# Patient Record
Sex: Female | Born: 1945
Health system: Southern US, Community
[De-identification: ages and names within clinical notes are randomized; demographics above are authoritative.]

## PROBLEM LIST (undated history)

## (undated) DIAGNOSIS — D649 Anemia, unspecified: Secondary | ICD-10-CM

## (undated) DIAGNOSIS — E119 Type 2 diabetes mellitus without complications: Secondary | ICD-10-CM

## (undated) DIAGNOSIS — N189 Chronic kidney disease, unspecified: Secondary | ICD-10-CM

## (undated) HISTORY — DX: Type 2 diabetes mellitus without complications: E11.9

## (undated) HISTORY — DX: Anemia, unspecified: D64.9

## (undated) HISTORY — DX: Chronic kidney disease, unspecified: N18.9

---

## 1986-12-11 HISTORY — PX: VAGINAL HYSTERECTOMY: SUR661

## 2001-11-09 HISTORY — PX: GASTRIC BYPASS: SHX52

## 2008-05-17 HISTORY — PX: OTHER SURGICAL HISTORY: SHX169

## 2010-05-17 DIAGNOSIS — I10 Essential (primary) hypertension: Secondary | ICD-10-CM | POA: Insufficient documentation

## 2010-05-17 DIAGNOSIS — E119 Type 2 diabetes mellitus without complications: Secondary | ICD-10-CM | POA: Insufficient documentation

## 2010-05-17 DIAGNOSIS — M353 Polymyalgia rheumatica: Secondary | ICD-10-CM | POA: Insufficient documentation

## 2010-05-17 DIAGNOSIS — E782 Mixed hyperlipidemia: Secondary | ICD-10-CM | POA: Insufficient documentation

## 2010-11-22 DIAGNOSIS — E559 Vitamin D deficiency, unspecified: Secondary | ICD-10-CM | POA: Insufficient documentation

## 2013-04-07 DIAGNOSIS — D509 Iron deficiency anemia, unspecified: Secondary | ICD-10-CM | POA: Insufficient documentation

## 2013-04-07 DIAGNOSIS — E538 Deficiency of other specified B group vitamins: Secondary | ICD-10-CM | POA: Insufficient documentation

## 2014-04-24 ENCOUNTER — Ambulatory Visit (INDEPENDENT_AMBULATORY_CARE_PROVIDER_SITE_OTHER): Payer: Medicare Other | Admitting: *Deleted

## 2014-04-24 DIAGNOSIS — E538 Deficiency of other specified B group vitamins: Secondary | ICD-10-CM

## 2014-04-24 MED ORDER — CYANOCOBALAMIN 1000 MCG/ML IJ SOLN
1000.0000 ug | INTRAMUSCULAR | Status: DC
  Administered 2014-04-24 – 2016-01-04 (×17): 1000 ug via INTRAMUSCULAR

## 2014-04-24 MED ORDER — GLUCOSE BLOOD VI STRP: ORAL_STRIP | Status: DC

## 2014-04-24 MED ORDER — ONETOUCH ULTRA SYSTEM W/DEVICE KIT: 1.0000 | PACK | Freq: Once | Status: DC

## 2014-04-24 NOTE — Progress Notes (Signed)
Vitamin b12 injection given and tolerated well. Order was given by dr. Darlyn Read.

## 2014-05-19 ENCOUNTER — Encounter: Payer: Self-pay | Admitting: Family Medicine

## 2014-05-19 ENCOUNTER — Encounter (INDEPENDENT_AMBULATORY_CARE_PROVIDER_SITE_OTHER): Payer: Self-pay

## 2014-05-19 ENCOUNTER — Ambulatory Visit (INDEPENDENT_AMBULATORY_CARE_PROVIDER_SITE_OTHER): Payer: Medicare Other | Admitting: Family Medicine

## 2014-05-19 VITALS — BP 139/83 | HR 82 | Temp 97.1°F | Ht 64.5 in | Wt 205.8 lb

## 2014-05-19 DIAGNOSIS — E119 Type 2 diabetes mellitus without complications: Secondary | ICD-10-CM

## 2014-05-19 DIAGNOSIS — E039 Hypothyroidism, unspecified: Secondary | ICD-10-CM

## 2014-05-19 DIAGNOSIS — M069 Rheumatoid arthritis, unspecified: Secondary | ICD-10-CM

## 2014-05-19 DIAGNOSIS — D519 Vitamin B12 deficiency anemia, unspecified: Secondary | ICD-10-CM

## 2014-05-19 DIAGNOSIS — E538 Deficiency of other specified B group vitamins: Secondary | ICD-10-CM

## 2014-05-19 LAB — POCT GLYCOSYLATED HEMOGLOBIN (HGB A1C): Hemoglobin A1C: 6

## 2014-05-19 NOTE — Progress Notes (Signed)
Subjective:  [name], female    DOB: [birth year]  Age: 69 y.o. MRN: [id number]  CC: Diabetes; Gastrophageal Reflux; Hyperlipidemia; Hypothyroidism; and Vit B12 deficiency   HPI Erin Ortiz presents foPatient does check blood sugar at home Patient denies symptoms such as polyuria, polydipsia, excessive hunger, nausea. Medications as noted below. Taking them regularly without complication/adverse reaction being reported today. a few hypoglycemic spells after Christmas. Had iron infusion last month with Dr. Vira Agar. Saw Dr. Franki Monte and Binnie Rail also. Has a cataract OS, but no retinopathy.  Patient presents for follow-up on  thyroid. She has a history of hypothyroidism for many years. It has been stable recently. Pt. denies any change in  voice, loss of hair, heat or cold intolerance. Energy level has been adequate to good. She denies constipation and diarrhea. No myxedema. Medication is as noted below. Verified that pt is taking it daily on an empty stomach. Well tolerated.  Patient in for follow-up of GERD. He is currently asymptomatic taking his PPI daily. There is no chest pain or heartburn. He is taking the medication regularly. No dysphagia or choking.   Patient in for follow-up of elevated cholesterol. Doing well without complaints on current medication. Denies side effects of statin including myalgia and arthralgia and nausea. Also in today for liver function testing. Currently no chest pain, shortness of breath or other cardiovascular related symptoms noted. History Erin Ortiz has no past medical history on file.   She has no past surgical history on file.   Her family history is not on file.She reports that she has never smoked. She does not have any smokeless tobacco history on file. She reports that she does not drink alcohol or use illicit drugs.  Current Outpatient Prescriptions on File Prior to Visit  Medication Sig Dispense Refill  . Blood Glucose Monitoring Suppl  (ONE TOUCH ULTRA SYSTEM KIT) W/DEVICE KIT 1 kit by Does not apply route once. 1 each 0  . glucose blood test strip Use as instructed 100 each 12   Current Facility-Administered Medications on File Prior to Visit  Medication Dose Route Frequency Provider Last Rate Last Dose  . cyanocobalamin ((VITAMIN B-12)) injection 1,000 mcg  1,000 mcg Intramuscular Q30 days Claretta Fraise, MD   1,000 mcg at 05/19/14 1041    ROS Review of Systems  Constitutional: Negative for fever, chills, diaphoresis, appetite change, fatigue and unexpected weight change.  HENT: Negative for congestion, ear pain, hearing loss, postnasal drip, rhinorrhea, sneezing, sore throat and trouble swallowing.   Eyes: Negative for pain.  Respiratory: Negative for cough, chest tightness and shortness of breath.   Cardiovascular: Negative for chest pain and palpitations.  Gastrointestinal: Negative for nausea, vomiting, abdominal pain, diarrhea and constipation.  Genitourinary: Negative for dysuria, frequency and menstrual problem.  Musculoskeletal: Negative for joint swelling and arthralgias.  Skin: Negative for rash.  Neurological: Negative for dizziness, weakness, numbness and headaches.  Psychiatric/Behavioral: Negative for dysphoric mood and agitation.    Objective:  BP 139/83 mmHg  Pulse 82  Temp(Src) 97.1 F (36.2 C) (Oral)  Ht 5' 4.5" (1.638 m)  Wt 205 lb 12.8 oz (93.35 kg)  BMI 34.79 kg/m2  LMP 04/11/1986  Physical Exam  Constitutional: She is oriented to person, place, and time. She appears well-developed and well-nourished. No distress.  HENT:  Head: Normocephalic and atraumatic.  Right Ear: External ear normal.  Left Ear: External ear normal.  Nose: Nose normal.  Mouth/Throat: Oropharynx is clear and moist.  Eyes: Conjunctivae and EOM  are normal. Pupils are equal, round, and reactive to light.  Neck: Normal range of motion. Neck supple. No thyromegaly present.  Cardiovascular: Normal rate, regular rhythm  and normal heart sounds.   No murmur heard. Pulmonary/Chest: Effort normal and breath sounds normal. No respiratory distress. She has no wheezes. She has no rales.  Abdominal: Soft. Bowel sounds are normal. She exhibits no distension. There is no tenderness.  Lymphadenopathy:    She has no cervical adenopathy.  Neurological: She is alert and oriented to person, place, and time. She has normal reflexes.  Skin: Skin is warm and dry.  Psychiatric: She has a normal mood and affect. Her behavior is normal. Judgment and thought content normal.    Assessment & Plan:   Erin Ortiz was seen today for diabetes, gastrophageal reflux, hyperlipidemia, hypothyroidism and vit b12 deficiency.  Diagnoses and associated orders for this visit:  Diabetes mellitus type 2, controlled, without complications - POCT glycosylated hemoglobin (Hb A1C) - TSH - T4, Free - CMP14+EGFR - Microalbumin, urine  Hypothyroidism, unspecified hypothyroidism type - TSH - T4, Free - CMP14+EGFR - Microalbumin, urine  Rheumatoid arthritis - TSH - T4, Free - CMP14+EGFR - Microalbumin, urine  Anemia due to vitamin B12 deficiency - TSH - T4, Free - CMP14+EGFR - Microalbumin, urine    I am having Ms. Abplanalp maintain her ONE TOUCH ULTRA SYSTEM KIT, glucose blood, atorvastatin, Calcium Carbonate-Vitamin D, Vitamin D, cyanocobalamin, metFORMIN, levothyroxine, lisinopril, omeprazole, predniSONE, and raloxifene. We administered cyanocobalamin. We will continue to administer cyanocobalamin.  Meds ordered this encounter  Medications  . atorvastatin (LIPITOR) 40 MG tablet    Sig: Take 40 mg by mouth daily.  . Calcium Carbonate-Vitamin D 600-125 MG-UNIT TABS    Sig: Take 2 tablets by mouth daily.  . Cholecalciferol (VITAMIN D) 2000 UNITS tablet    Sig: Take 2,000 Units by mouth daily.  . cyanocobalamin (,VITAMIN B-12,) 1000 MCG/ML injection    Sig: Inject 1,000 mcg into the muscle every 30 (thirty) days.  . metFORMIN  (GLUCOPHAGE-XR) 750 MG 24 hr tablet    Sig: Take 750 mg by mouth 2 (two) times daily.  Marland Kitchen levothyroxine (SYNTHROID, LEVOTHROID) 125 MCG tablet    Sig: Take 125 mcg by mouth daily.  Marland Kitchen lisinopril (PRINIVIL,ZESTRIL) 20 MG tablet    Sig: Take 20 mg by mouth 2 (two) times daily.  Marland Kitchen omeprazole (PRILOSEC) 40 MG capsule    Sig: Take 40 mg by mouth daily.  . predniSONE (DELTASONE) 1 MG tablet    Sig: Take 3 mg by mouth daily.  . raloxifene (EVISTA) 60 MG tablet    Sig: Take 60 mg by mouth daily.     Follow-up: Return in about 3 months (around 08/17/2014) for diabetes.  Claretta Fraise, M.D.

## 2014-05-19 NOTE — Patient Instructions (Signed)
Diabetes and Foot Care Diabetes may cause you to have problems because of poor blood supply (circulation) to your feet and legs. This may cause the skin on your feet to become thinner, break easier, and heal more slowly. Your skin may become dry, and the skin may peel and crack. You may also have nerve damage in your legs and feet causing decreased feeling in them. You may not notice minor injuries to your feet that could lead to infections or more serious problems. Taking care of your feet is one of the most important things you can do for yourself.  HOME CARE INSTRUCTIONS  Wear shoes at all times, even in the house. Do not go barefoot. Bare feet are easily injured.  Check your feet daily for blisters, cuts, and redness. If you cannot see the bottom of your feet, use a mirror or ask someone for help.  Wash your feet with warm water (do not use hot water) and mild soap. Then pat your feet and the areas between your toes until they are completely dry. Do not soak your feet as this can dry your skin.  Apply a moisturizing lotion or petroleum jelly (that does not contain alcohol and is unscented) to the skin on your feet and to dry, brittle toenails. Do not apply lotion between your toes.  Trim your toenails straight across. Do not dig under them or around the cuticle. File the edges of your nails with an emery board or nail file.  Do not cut corns or calluses or try to remove them with medicine.  Wear clean socks or stockings every day. Make sure they are not too tight. Do not wear knee-high stockings since they may decrease blood flow to your legs.  Wear shoes that fit properly and have enough cushioning. To break in new shoes, wear them for just a few hours a day. This prevents you from injuring your feet. Always look in your shoes before you put them on to be sure there are no objects inside.  Do not cross your legs. This may decrease the blood flow to your feet.  If you find a minor scrape,  cut, or break in the skin on your feet, keep it and the skin around it clean and dry. These areas may be cleansed with mild soap and water. Do not cleanse the area with peroxide, alcohol, or iodine.  When you remove an adhesive bandage, be sure not to damage the skin around it.  If you have a wound, look at it several times a day to make sure it is healing.  Do not use heating pads or hot water bottles. They may burn your skin. If you have lost feeling in your feet or legs, you may not know it is happening until it is too late.  Make sure your health care provider performs a complete foot exam at least annually or more often if you have foot problems. Report any cuts, sores, or bruises to your health care provider immediately. SEEK MEDICAL CARE IF:   You have an injury that is not healing.  You have cuts or breaks in the skin.  You have an ingrown nail.  You notice redness on your legs or feet.  You feel burning or tingling in your legs or feet.  You have pain or cramps in your legs and feet.  Your legs or feet are numb.  Your feet always feel cold. SEEK IMMEDIATE MEDICAL CARE IF:   There is increasing redness,   swelling, or pain in or around a wound.  There is a red line that goes up your leg.  Pus is coming from a wound.  You develop a fever or as directed by your health care provider.  You notice a bad smell coming from an ulcer or wound. Document Released: 03/25/2000 Document Revised: 11/28/2012 Document Reviewed: 09/04/2012 Exit[employer] Patient Information 2015 [employer]. This information is not intended to replace advice given to you by your health care provider. Make sure you discuss any questions you have with your health care provider. DASH Eating Plan DASH stands for "Dietary Approaches to Stop Hypertension." The DASH eating plan is a healthy eating plan that has been shown to reduce high blood pressure (hypertension). Additional health benefits may include reducing  the risk of type 2 diabetes mellitus, heart disease, and stroke. The DASH eating plan may also help with weight loss. WHAT DO I NEED TO KNOW ABOUT THE DASH EATING PLAN? For the DASH eating plan, you will follow these general guidelines:  Choose foods with a percent daily value for sodium of less than 5% (as listed on the food label).  Use salt-free seasonings or herbs instead of table salt or sea salt.  Check with your health care provider or pharmacist before using salt substitutes.  Eat lower-sodium products, often labeled as "lower sodium" or "no salt added."  Eat fresh foods.  Eat more vegetables, fruits, and low-fat dairy products.  Choose whole grains. Look for the word "whole" as the first word in the ingredient list.  Choose fish and skinless chicken or turkey more often than red meat. Limit fish, poultry, and meat to 6 oz (170 g) each day.  Limit sweets, desserts, sugars, and sugary drinks.  Choose heart-healthy fats.  Limit cheese to 1 oz (28 g) per day.  Eat more home-cooked food and less restaurant, buffet, and fast food.  Limit fried foods.  Cook foods using methods other than frying.  Limit canned vegetables. If you do use them, rinse them well to decrease the sodium.  When eating at a restaurant, ask that your food be prepared with less salt, or no salt if possible. WHAT FOODS CAN I EAT? Seek help from a dietitian for individual calorie needs. Grains Whole grain or whole wheat bread. Brown rice. Whole grain or whole wheat pasta. Quinoa, bulgur, and whole grain cereals. Low-sodium cereals. Corn or whole wheat flour tortillas. Whole grain cornbread. Whole grain crackers. Low-sodium crackers. Vegetables Fresh or frozen vegetables (raw, steamed, roasted, or grilled). Low-sodium or reduced-sodium tomato and vegetable juices. Low-sodium or reduced-sodium tomato sauce and paste. Low-sodium or reduced-sodium canned vegetables.  Fruits All fresh, canned (in natural  juice), or frozen fruits. Meat and Other Protein Products Ground beef (85% or leaner), grass-fed beef, or beef trimmed of fat. Skinless chicken or turkey. Ground chicken or turkey. Pork trimmed of fat. All fish and seafood. Eggs. Dried beans, peas, or lentils. Unsalted nuts and seeds. Unsalted canned beans. Dairy Low-fat dairy products, such as skim or 1% milk, 2% or reduced-fat cheeses, low-fat ricotta or cottage cheese, or plain low-fat yogurt. Low-sodium or reduced-sodium cheeses. Fats and Oils Tub margarines without trans fats. Light or reduced-fat mayonnaise and salad dressings (reduced sodium). Avocado. Safflower, olive, or canola oils. Natural peanut or almond butter. Other Unsalted popcorn and pretzels. The items listed above may not be a complete list of recommended foods or beverages. Contact your dietitian for more options. WHAT FOODS ARE NOT RECOMMENDED? Grains White bread. White pasta.   White rice. Refined cornbread. Bagels and croissants. Crackers that contain trans fat. Vegetables Creamed or fried vegetables. Vegetables in a cheese sauce. Regular canned vegetables. Regular canned tomato sauce and paste. Regular tomato and vegetable juices. Fruits Dried fruits. Canned fruit in light or heavy syrup. Fruit juice. Meat and Other Protein Products Fatty cuts of meat. Ribs, chicken wings, bacon, sausage, bologna, salami, chitterlings, fatback, hot dogs, bratwurst, and packaged luncheon meats. Salted nuts and seeds. Canned beans with salt. Dairy Whole or 2% milk, cream, half-and-half, and cream cheese. Whole-fat or sweetened yogurt. Full-fat cheeses or blue cheese. Nondairy creamers and whipped toppings. Processed cheese, cheese spreads, or cheese curds. Condiments Onion and garlic salt, seasoned salt, table salt, and sea salt. Canned and packaged gravies. Worcestershire sauce. Tartar sauce. Barbecue sauce. Teriyaki sauce. Soy sauce, including reduced sodium. Steak sauce. Fish sauce.  Oyster sauce. Cocktail sauce. Horseradish. Ketchup and mustard. Meat flavorings and tenderizers. Bouillon cubes. Hot sauce. Tabasco sauce. Marinades. Taco seasonings. Relishes. Fats and Oils Butter, stick margarine, lard, shortening, ghee, and bacon fat. Coconut, palm kernel, or palm oils. Regular salad dressings. Other Pickles and olives. Salted popcorn and pretzels. The items listed above may not be a complete list of foods and beverages to avoid. Contact your dietitian for more information. WHERE CAN I FIND MORE INFORMATION? National Heart, Lung, and [employer] Institute: www.nhlbi.nih.gov/health/health-topics/topics/dash/ Document Released: 03/17/2011 Document Revised: 08/12/2013 Document Reviewed: 01/30/2013 ExitCare Patient Information 2015 [employer]. This information is not intended to replace advice given to you by your health care provider. Make sure you discuss any questions you have with your health care provider. Basic Carbohydrate Counting for Diabetes Mellitus Carbohydrate counting is a method for keeping track of the amount of carbohydrates you eat. Eating carbohydrates naturally increases the level of sugar (glucose) in your blood, so it is important for you to know the amount that is okay for you to have in every meal. Carbohydrate counting helps keep the level of glucose in your blood within normal limits. The amount of carbohydrates allowed is different for every person. A dietitian can help you calculate the amount that is right for you. Once you know the amount of carbohydrates you can have, you can count the carbohydrates in the foods you want to eat. Carbohydrates are found in the following foods:  Grains, such as breads and cereals.  Dried beans and soy products.  Starchy vegetables, such as potatoes, peas, and corn.  Fruit and fruit juices.  Milk and yogurt.  Sweets and snack foods, such as cake, cookies, candy, chips, soft drinks, and fruit drinks. CARBOHYDRATE  COUNTING There are two ways to count the carbohydrates in your food. You can use either of the methods or a combination of both. Reading the "Nutrition Facts" on Packaged Food The "Nutrition Facts" is an area that is included on the labels of almost all packaged food and beverages in the [place]. It includes the serving size of that food or beverage and information about the nutrients in each serving of the food, including the grams (g) of carbohydrate per serving.  Decide the number of servings of this food or beverage that you will be able to eat or drink. Multiply that number of servings by the number of grams of carbohydrate that is listed on the label for that serving. The total will be the amount of carbohydrates you will be having when you eat or drink this food or beverage. Learning Standard Serving Sizes of Food When you eat food that   is not packaged or does not include "Nutrition Facts" on the label, you need to measure the servings in order to count the amount of carbohydrates.A serving of most carbohydrate-rich foods contains about 15 g of carbohydrates. The following list includes serving sizes of carbohydrate-rich foods that provide 15 g ofcarbohydrate per serving:   1 slice of bread (1 oz) or 1 six-inch tortilla.    of a hamburger bun or English muffin.  4-6 crackers.   cup unsweetened dry cereal.    cup hot cereal.   cup rice or pasta.    cup mashed potatoes or  of a large baked potato.  1 cup fresh fruit or one small piece of fruit.    cup canned or frozen fruit or fruit juice.  1 cup milk.   cup plain fat-free yogurt or yogurt sweetened with artificial sweeteners.   cup cooked dried beans or starchy vegetable, such as peas, corn, or potatoes.  Decide the number of standard-size servings that you will eat. Multiply that number of servings by 15 (the grams of carbohydrates in that serving). For example, if you eat 2 cups of strawberries, you will  have eaten 2 servings and 30 g of carbohydrates (2 servings x 15 g = 30 g). For foods such as soups and casseroles, in which more than one food is mixed in, you will need to count the carbohydrates in each food that is included. EXAMPLE OF CARBOHYDRATE COUNTING Sample Dinner  3 oz chicken breast.   cup of brown rice.   cup of corn.  1 cup milk.   1 cup strawberries with sugar-free whipped topping.  Carbohydrate Calculation Step 1: Identify the foods that contain carbohydrates:   Rice.   Corn.   Milk.   Strawberries. Step 2:Calculate the number of servings eaten of each:   2 servings of rice.   1 serving of corn.   1 serving of milk.   1 serving of strawberries. Step 3: Multiply each of those number of servings by 15 g:   2 servings of rice x 15 g = 30 g.   1 serving of corn x 15 g = 15 g.   1 serving of milk x 15 g = 15 g.   1 serving of strawberries x 15 g = 15 g. Step 4: Add together all of the amounts to find the total grams of carbohydrates eaten: 30 g + 15 g + 15 g + 15 g = 75 g. Document Released: 03/28/2005 Document Revised: 08/12/2013 Document Reviewed: 02/22/2013 Exit[employer] Patient Information 2015 [employer]. This information is not intended to replace advice given to you by your health care provider. Make sure you discuss any questions you have with your health care provider.  

## 2014-05-20 LAB — MICROALBUMIN, URINE

## 2014-06-18 ENCOUNTER — Other Ambulatory Visit: Payer: Self-pay | Admitting: Family Medicine

## 2014-06-19 ENCOUNTER — Ambulatory Visit: Payer: Medicare Other

## 2014-06-19 MED ORDER — METFORMIN HCL ER 750 MG PO TB24: 750.0000 mg | ORAL_TABLET | Freq: Two times a day (BID) | ORAL | Status: DC

## 2014-06-19 NOTE — Telephone Encounter (Signed)
Pt needed a refill on her metformin, was just seen 05/17/14 by dr Darlyn Read.

## 2014-06-23 ENCOUNTER — Ambulatory Visit (INDEPENDENT_AMBULATORY_CARE_PROVIDER_SITE_OTHER): Payer: Medicare Other | Admitting: *Deleted

## 2014-06-23 ENCOUNTER — Telehealth: Payer: Self-pay | Admitting: *Deleted

## 2014-06-23 DIAGNOSIS — E538 Deficiency of other specified B group vitamins: Secondary | ICD-10-CM | POA: Diagnosis not present

## 2014-06-23 NOTE — Patient Instructions (Signed)

## 2014-06-23 NOTE — Progress Notes (Signed)
Patient tolerated well.

## 2014-07-28 ENCOUNTER — Ambulatory Visit: Payer: Medicare Other

## 2014-08-18 ENCOUNTER — Encounter: Payer: Self-pay | Admitting: Family Medicine

## 2014-08-18 ENCOUNTER — Ambulatory Visit (INDEPENDENT_AMBULATORY_CARE_PROVIDER_SITE_OTHER): Payer: Medicare Other | Admitting: Family Medicine

## 2014-08-18 VITALS — BP 117/70 | HR 79 | Temp 97.5°F | Ht 65.0 in | Wt 205.4 lb

## 2014-08-18 DIAGNOSIS — Z780 Asymptomatic menopausal state: Secondary | ICD-10-CM | POA: Diagnosis not present

## 2014-08-18 DIAGNOSIS — D649 Anemia, unspecified: Secondary | ICD-10-CM

## 2014-08-18 DIAGNOSIS — E538 Deficiency of other specified B group vitamins: Secondary | ICD-10-CM | POA: Diagnosis not present

## 2014-08-18 DIAGNOSIS — E038 Other specified hypothyroidism: Secondary | ICD-10-CM | POA: Diagnosis not present

## 2014-08-18 DIAGNOSIS — E785 Hyperlipidemia, unspecified: Secondary | ICD-10-CM | POA: Diagnosis not present

## 2014-08-18 DIAGNOSIS — E119 Type 2 diabetes mellitus without complications: Secondary | ICD-10-CM

## 2014-08-18 LAB — POCT GLYCOSYLATED HEMOGLOBIN (HGB A1C): Hemoglobin A1C: 5.9

## 2014-08-18 LAB — POCT CBC
Granulocyte percent: 71.4 %G (ref 37–80)
HEMATOCRIT: 34.2 % — AB (ref 37.7–47.9)
Hemoglobin: 10.5 g/dL — AB (ref 12.2–16.2)
LYMPH, POC: 1.5 (ref 0.6–3.4)
MCH: 27.1 pg (ref 27–31.2)
MCHC: 30.6 g/dL — AB (ref 31.8–35.4)
MCV: 88.5 fL (ref 80–97)
MPV: 8.9 fL (ref 0–99.8)
PLATELET COUNT, POC: 183 10*3/uL (ref 142–424)
POC Granulocyte: 4.5 (ref 2–6.9)
POC LYMPH PERCENT: 23.5 %L (ref 10–50)
RBC: 3.87 M/uL — AB (ref 4.04–5.48)
RDW, POC: 14.4 %
WBC: 6.3 10*3/uL (ref 4.6–10.2)

## 2014-08-18 MED ORDER — LISINOPRIL 20 MG PO TABS: 20.0000 mg | ORAL_TABLET | Freq: Two times a day (BID) | ORAL | Status: DC

## 2014-08-18 MED ORDER — LEVOTHYROXINE SODIUM 125 MCG PO TABS: 125.0000 ug | ORAL_TABLET | Freq: Every day | ORAL | Status: DC

## 2014-08-18 MED ORDER — ATORVASTATIN CALCIUM 40 MG PO TABS: 40.0000 mg | ORAL_TABLET | Freq: Every day | ORAL | Status: DC

## 2014-08-18 MED ORDER — METFORMIN HCL ER 750 MG PO TB24: 750.0000 mg | ORAL_TABLET | Freq: Two times a day (BID) | ORAL | Status: DC

## 2014-08-18 MED ORDER — OMEPRAZOLE 40 MG PO CPDR: 40.0000 mg | DELAYED_RELEASE_CAPSULE | Freq: Every day | ORAL | Status: DC

## 2014-08-18 MED ORDER — RALOXIFENE HCL 60 MG PO TABS: 60.0000 mg | ORAL_TABLET | Freq: Every day | ORAL | Status: DC

## 2014-08-18 NOTE — Progress Notes (Signed)
Subjective:  [name], female    DOB: [birth year]  Age: 69 y.o. MRN: [id number]  CC: Hypertension; Hyperlipidemia; and Diabetes   HPI Erin Ortiz presents forFollow-up of diabetes. Patient does check blood sugar at home. Denies any excessive elevation of sugar. Nothing below 70. Patient denies symptoms such as polyuria, polydipsia, excessive hunger, nausea No significant hypoglycemic spells noted. Medications as noted below. Taking them regularly without complication/adverse reaction being reported today.    follow-up of hypertension. Patient has no history of headache chest pain or shortness of breath or recent cough. Patient also denies symptoms of TIA such as numbness weakness lateralizing. Patient checks  blood pressure at home and has not had any elevated readings recently. Patient denies side effects from his medication. States taking it regularly.   Patient in for follow-up of elevated cholesterol. Doing well without complaints on current medication. Denies side effects of statin including myalgia and arthralgia and nausea. Also in today for liver function testing. Currently no chest pain, shortness of breath or other cardiovascular related symptoms noted.  History Erin Ortiz has no past medical history on file.   She has no past surgical history on file.   Her family history is not on file.She reports that she has never smoked. She does not have any smokeless tobacco history on file. She reports that she does not drink alcohol or use illicit drugs.  Current Outpatient Prescriptions on File Prior to Visit  Medication Sig Dispense Refill  . Blood Glucose Monitoring Suppl (ONE TOUCH ULTRA SYSTEM KIT) W/DEVICE KIT 1 kit by Does not apply route once. 1 each 0  . Calcium Carbonate-Vitamin D 600-125 MG-UNIT TABS Take 2 tablets by mouth daily.    . Cholecalciferol (VITAMIN D) 2000 UNITS tablet Take 2,000 Units by mouth daily.    Marland Kitchen glucose blood test strip Use as  instructed 100 each 12  . predniSONE (DELTASONE) 1 MG tablet Take 3 mg by mouth daily.    . cyanocobalamin (,VITAMIN B-12,) 1000 MCG/ML injection Inject 1,000 mcg into the muscle every 30 (thirty) days.     Current Facility-Administered Medications on File Prior to Visit  Medication Dose Route Frequency Provider Last Rate Last Dose  . cyanocobalamin ((VITAMIN B-12)) injection 1,000 mcg  1,000 mcg Intramuscular Q30 days Claretta Fraise, MD   1,000 mcg at 08/18/14 3536    ROS Review of Systems  Constitutional: Negative for fever, chills, diaphoresis, appetite change, fatigue and unexpected weight change.  HENT: Negative for congestion, ear pain, hearing loss, postnasal drip, rhinorrhea, sneezing, sore throat and trouble swallowing.   Eyes: Negative for pain.  Respiratory: Negative for cough, chest tightness and shortness of breath.   Cardiovascular: Negative for chest pain and palpitations.  Gastrointestinal: Negative for nausea, vomiting, abdominal pain, diarrhea and constipation.  Genitourinary: Negative for dysuria, frequency and menstrual problem.  Musculoskeletal: Negative for joint swelling and arthralgias.  Skin: Negative for rash.  Neurological: Negative for dizziness, weakness, numbness and headaches.  Psychiatric/Behavioral: Negative for dysphoric mood and agitation.    Objective:  BP 117/70 mmHg  Pulse 79  Temp(Src) 97.5 F (36.4 C) (Oral)  Ht 5' 5"  (1.651 m)  Wt 205 lb 6.4 oz (93.169 kg)  BMI 34.18 kg/m2  LMP 04/11/1986  BP Readings from Last 3 Encounters:  08/18/14 117/70  05/19/14 139/83    Wt Readings from Last 3 Encounters:  08/18/14 205 lb 6.4 oz (93.169 kg)  05/19/14 205 lb 12.8 oz (93.35 kg)     Physical Exam  Constitutional: She is oriented to person, place, and time. She appears well-developed and well-nourished. No distress.  HENT:  Head: Normocephalic and atraumatic.  Right Ear: External ear normal.  Left Ear: External ear normal.  Nose: Nose  normal.  Mouth/Throat: Oropharynx is clear and moist.  Eyes: Conjunctivae and EOM are normal. Pupils are equal, round, and reactive to light.  Neck: Normal range of motion. Neck supple. No thyromegaly present.  Cardiovascular: Normal rate, regular rhythm and normal heart sounds.   No murmur heard. Pulmonary/Chest: Effort normal and breath sounds normal. No respiratory distress. She has no wheezes. She has no rales.  Abdominal: Soft. Bowel sounds are normal. She exhibits no distension. There is no tenderness.  Lymphadenopathy:    She has no cervical adenopathy.  Neurological: She is alert and oriented to person, place, and time. She has normal reflexes.  Skin: Skin is warm and dry.  Psychiatric: She has a normal mood and affect. Her behavior is normal. Judgment and thought content normal.    Lab Results  Component Value Date   HGBA1C 5.9 08/18/2014   HGBA1C 6.0% 05/19/2014    Lab Results  Component Value Date   WBC 6.3 08/18/2014   HGB 10.5* 08/18/2014   HCT 34.2* 08/18/2014   HGBA1C 5.9 08/18/2014     Assessment & Plan:   Erin Ortiz was seen today for hypertension, hyperlipidemia and diabetes.  Diagnoses and all orders for this visit:  Diabetes mellitus type 2, controlled, without complications Orders: -     POCT glycosylated hemoglobin (Hb A1C); Standing -     CMP14+EGFR; Standing -     POCT glycosylated hemoglobin (Hb A1C) -     CMP14+EGFR  Hyperlipemia Orders: -     CMP14+EGFR; Standing -     Lipid panel; Standing -     NMR, lipoprofile -     CMP14+EGFR  Other specified hypothyroidism Orders: -     Thyroid Panel With TSH  Postmenopausal Orders: -     POCT CBC; Standing -     Vit D  25 hydroxy (rtn osteoporosis monitoring); Standing -     POCT CBC -     Vit D  25 hydroxy (rtn osteoporosis monitoring)  Other orders -     atorvastatin (LIPITOR) 40 MG tablet; Take 1 tablet (40 mg total) by mouth daily. -     levothyroxine (SYNTHROID, LEVOTHROID) 125 MCG  tablet; Take 1 tablet (125 mcg total) by mouth daily. -     lisinopril (PRINIVIL,ZESTRIL) 20 MG tablet; Take 1 tablet (20 mg total) by mouth 2 (two) times daily. -     metFORMIN (GLUCOPHAGE-XR) 750 MG 24 hr tablet; Take 1 tablet (750 mg total) by mouth 2 (two) times daily. -     omeprazole (PRILOSEC) 40 MG capsule; Take 1 capsule (40 mg total) by mouth daily. -     raloxifene (EVISTA) 60 MG tablet; Take 1 tablet (60 mg total) by mouth daily.   I have changed Erin Ortiz's atorvastatin, levothyroxine, lisinopril, omeprazole, and raloxifene. I am also having her maintain her [place] KIT, glucose blood, Calcium Carbonate-Vitamin D, Vitamin D, cyanocobalamin, predniSONE, and metFORMIN. We administered cyanocobalamin. We will continue to administer cyanocobalamin.  Meds ordered this encounter  Medications  . atorvastatin (LIPITOR) 40 MG tablet    Sig: Take 1 tablet (40 mg total) by mouth daily.    Dispense:  90 tablet    Refill:  4  . levothyroxine (SYNTHROID, LEVOTHROID) 125 MCG tablet  Sig: Take 1 tablet (125 mcg total) by mouth daily.    Dispense:  90 tablet    Refill:  4  . lisinopril (PRINIVIL,ZESTRIL) 20 MG tablet    Sig: Take 1 tablet (20 mg total) by mouth 2 (two) times daily.    Dispense:  180 tablet    Refill:  4  . metFORMIN (GLUCOPHAGE-XR) 750 MG 24 hr tablet    Sig: Take 1 tablet (750 mg total) by mouth 2 (two) times daily.    Dispense:  180 tablet    Refill:  4  . omeprazole (PRILOSEC) 40 MG capsule    Sig: Take 1 capsule (40 mg total) by mouth daily.    Dispense:  90 capsule    Refill:  4  . raloxifene (EVISTA) 60 MG tablet    Sig: Take 1 tablet (60 mg total) by mouth daily.    Dispense:  90 tablet    Refill:  4     Follow-up: No Follow-up on file.  Claretta Fraise, M.D.

## 2014-08-19 LAB — NMR, LIPOPROFILE
Cholesterol: 133 mg/dL (ref 100–199)
HDL CHOLESTEROL BY NMR: 56 mg/dL (ref >39)
HDL Particle Number: 32.3 umol/L (ref >=30.5)
LDL PARTICLE NUMBER: 946 nmol/L (ref <1000)
LDL SIZE: 21 nm (ref >20.5)
LDL-C: 55 mg/dL (ref 0–99)
LP-IR Score: 26 (ref <=45)
Small LDL Particle Number: 434 nmol/L (ref <=527)
TRIGLYCERIDES BY NMR: 108 mg/dL (ref 0–149)

## 2014-08-19 LAB — THYROID PANEL WITH TSH
Free Thyroxine Index: 2.3 (ref 1.2–4.9)
T3 UPTAKE RATIO: 31 % (ref 24–39)
T4, Total: 7.5 ug/dL (ref 4.5–12.0)
TSH: 0.694 u[IU]/mL (ref 0.450–4.500)

## 2014-08-20 NOTE — Addendum Note (Signed)
Addended by: Tamera Punt on: 08/20/2014 03:19 PM   Modules accepted: Orders

## 2014-08-22 LAB — FE+TIBC+FER+B12+FOLIC
Ferritin: 449 ng/mL — ABNORMAL HIGH (ref 15–150)
Folate: 10.5 ng/mL (ref >3.0)
IRON: 71 ug/dL (ref 27–139)
Iron Saturation: 32 % (ref 15–55)
Total Iron Binding Capacity: 220 ug/dL — ABNORMAL LOW (ref 250–450)
UIBC: 149 ug/dL (ref 118–369)
VITAMIN B 12: 673 pg/mL (ref 211–946)

## 2014-08-22 LAB — SPECIMEN STATUS REPORT

## 2014-08-26 ENCOUNTER — Telehealth: Payer: Self-pay | Admitting: *Deleted

## 2014-08-26 NOTE — Telephone Encounter (Signed)
-----   Message from Mechele Claude, MD sent at 08/22/2014  7:50 PM EDT ----- Patient that she has a moderate anemia that appears to be related to chronic illness and is stable and not necessary to treat. It will not respond to iron as her iron level is good.

## 2014-08-26 NOTE — Telephone Encounter (Signed)
Pt notified of results Verbalizes understanding 

## 2014-09-22 ENCOUNTER — Ambulatory Visit (INDEPENDENT_AMBULATORY_CARE_PROVIDER_SITE_OTHER): Payer: Medicare Other | Admitting: *Deleted

## 2014-09-22 DIAGNOSIS — E538 Deficiency of other specified B group vitamins: Secondary | ICD-10-CM

## 2014-09-22 NOTE — Patient Instructions (Signed)

## 2014-09-22 NOTE — Progress Notes (Signed)
Vitamin b12 injection given and tolerated well.  

## 2014-10-24 ENCOUNTER — Ambulatory Visit (INDEPENDENT_AMBULATORY_CARE_PROVIDER_SITE_OTHER): Payer: Medicare Other | Admitting: *Deleted

## 2014-10-24 DIAGNOSIS — E538 Deficiency of other specified B group vitamins: Secondary | ICD-10-CM

## 2014-10-24 NOTE — Progress Notes (Signed)
Pt tolerated well

## 2014-11-07 ENCOUNTER — Encounter: Payer: Self-pay | Admitting: Family Medicine

## 2014-11-19 ENCOUNTER — Ambulatory Visit (INDEPENDENT_AMBULATORY_CARE_PROVIDER_SITE_OTHER): Payer: Medicare Other | Admitting: Family Medicine

## 2014-11-19 ENCOUNTER — Other Ambulatory Visit: Payer: Self-pay | Admitting: Family Medicine

## 2014-11-19 ENCOUNTER — Encounter: Payer: Self-pay | Admitting: Family Medicine

## 2014-11-19 VITALS — BP 118/77 | HR 77 | Temp 97.4°F | Ht 65.0 in | Wt 207.2 lb

## 2014-11-19 DIAGNOSIS — E538 Deficiency of other specified B group vitamins: Secondary | ICD-10-CM

## 2014-11-19 DIAGNOSIS — D519 Vitamin B12 deficiency anemia, unspecified: Secondary | ICD-10-CM

## 2014-11-19 DIAGNOSIS — E038 Other specified hypothyroidism: Secondary | ICD-10-CM

## 2014-11-19 DIAGNOSIS — E119 Type 2 diabetes mellitus without complications: Secondary | ICD-10-CM | POA: Diagnosis not present

## 2014-11-19 DIAGNOSIS — E785 Hyperlipidemia, unspecified: Secondary | ICD-10-CM

## 2014-11-19 DIAGNOSIS — M810 Age-related osteoporosis without current pathological fracture: Secondary | ICD-10-CM

## 2014-11-19 LAB — POCT GLYCOSYLATED HEMOGLOBIN (HGB A1C): HEMOGLOBIN A1C: 6.1

## 2014-11-20 LAB — CBC WITH DIFFERENTIAL/PLATELET
BASOS ABS: 0.1 10*3/uL (ref 0.0–0.2)
Basos: 1 %
EOS (ABSOLUTE): 0.1 10*3/uL (ref 0.0–0.4)
Eos: 2 %
HEMATOCRIT: 31.8 % — AB (ref 34.0–46.6)
Hemoglobin: 10 g/dL — ABNORMAL LOW (ref 11.1–15.9)
Immature Grans (Abs): 0 10*3/uL (ref 0.0–0.1)
Immature Granulocytes: 0 %
LYMPHS: 18 %
Lymphocytes Absolute: 1.1 10*3/uL (ref 0.7–3.1)
MCH: 27.9 pg (ref 26.6–33.0)
MCHC: 31.4 g/dL — AB (ref 31.5–35.7)
MCV: 89 fL (ref 79–97)
MONOCYTES: 7 %
Monocytes Absolute: 0.5 10*3/uL (ref 0.1–0.9)
NEUTROS ABS: 4.5 10*3/uL (ref 1.4–7.0)
Neutrophils: 72 %
Platelets: 210 10*3/uL (ref 150–379)
RBC: 3.59 x10E6/uL — ABNORMAL LOW (ref 3.77–5.28)
RDW: 14.9 % (ref 12.3–15.4)
WBC: 6.3 10*3/uL (ref 3.4–10.8)

## 2014-11-20 LAB — CMP14+EGFR
A/G RATIO: 1.9 (ref 1.1–2.5)
ALK PHOS: 83 IU/L (ref 39–117)
ALT: 14 IU/L (ref 0–32)
AST: 19 IU/L (ref 0–40)
Albumin: 3.8 g/dL (ref 3.6–4.8)
BUN/Creatinine Ratio: 13 (ref 11–26)
BUN: 20 mg/dL (ref 8–27)
Bilirubin Total: 0.4 mg/dL (ref 0.0–1.2)
CALCIUM: 8.8 mg/dL (ref 8.7–10.3)
CO2: 19 mmol/L (ref 18–29)
Chloride: 108 mmol/L (ref 97–108)
Creatinine, Ser: 1.49 mg/dL — ABNORMAL HIGH (ref 0.57–1.00)
GFR calc Af Amer: 41 mL/min/{1.73_m2} — ABNORMAL LOW (ref >59)
GFR, EST NON AFRICAN AMERICAN: 36 mL/min/{1.73_m2} — AB (ref >59)
Globulin, Total: 2 g/dL (ref 1.5–4.5)
Glucose: 121 mg/dL — ABNORMAL HIGH (ref 65–99)
POTASSIUM: 4.5 mmol/L (ref 3.5–5.2)
SODIUM: 142 mmol/L (ref 134–144)
Total Protein: 5.8 g/dL — ABNORMAL LOW (ref 6.0–8.5)

## 2014-11-20 LAB — LIPID PANEL
CHOL/HDL RATIO: 2.4 ratio (ref 0.0–4.4)
Cholesterol, Total: 147 mg/dL (ref 100–199)
HDL: 62 mg/dL (ref >39)
LDL CALC: 70 mg/dL (ref 0–99)
Triglycerides: 75 mg/dL (ref 0–149)
VLDL Cholesterol Cal: 15 mg/dL (ref 5–40)

## 2014-11-20 NOTE — Progress Notes (Signed)
Subjective:  [name], female    DOB: 08/29/1945  Age: 69 y.o. MRN: [id number]  CC: No chief complaint on file.   HPI Erin Ortiz presents for  follow-up of hypertension. Patient has no history of headache chest pain or shortness of breath or recent cough. Patient also denies symptoms of TIA such as numbness weakness lateralizing. Patient checks  blood pressure at home and has not had any elevated readings recently. Patient denies side effects from his medication. States taking it regularly.  Patient also  in for follow-up of elevated cholesterol. Doing well without complaints on current medication. Denies side effects of statin including myalgia and arthralgia and nausea. Also in today for liver function testing. Currently no chest pain, shortness of breath or other cardiovascular related symptoms noted.  Follow-up of diabetes. Patient does check blood sugar at home. Readings run between 90 and 160 Patient denies symptoms such as polyuria, polydipsia, excessive hunger, nausea No significant hypoglycemic spells noted. Medications as noted below. Taking them regularly without complication/adverse reaction being reported today.   Pt. Reports stability of her RMR. Followed by rheumatology , Dr. Towana Badger in [place]. She continues to take low-dose prednisone. This seems to be well balanced with her diabetes medications and she denies any elevation of her blood sugars. No recent flares of her pain.  Has a history of anemia. 2 today for recheck. No specific symptoms such as excessive cold, excessive loss of energy/fatigue/sluggishness. However she states she lives with a certain degree of this due to her multiple ongoing diagnoses.   History Erin Ortiz has no past medical history on file.   She has no past surgical history on file.   Her family history is not on file.She reports that she has never smoked. She does not have any smokeless tobacco history on file. She reports  that she does not drink alcohol or use illicit drugs.  Current Outpatient Prescriptions on File Prior to Visit  Medication Sig Dispense Refill  . atorvastatin (LIPITOR) 40 MG tablet Take 1 tablet (40 mg total) by mouth daily. 90 tablet 4  . Blood Glucose Monitoring Suppl (ONE TOUCH ULTRA SYSTEM KIT) W/DEVICE KIT 1 kit by Does not apply route once. 1 each 0  . Calcium Carbonate-Vitamin D 600-125 MG-UNIT TABS Take 2 tablets by mouth daily.    . Cholecalciferol (VITAMIN D) 2000 UNITS tablet Take 2,000 Units by mouth daily.    . cyanocobalamin (,VITAMIN B-12,) 1000 MCG/ML injection Inject 1,000 mcg into the muscle every 30 (thirty) days.    Marland Kitchen glucose blood test strip Use as instructed 100 each 12  . levothyroxine (SYNTHROID, LEVOTHROID) 125 MCG tablet Take 1 tablet (125 mcg total) by mouth daily. 90 tablet 4  . lisinopril (PRINIVIL,ZESTRIL) 20 MG tablet Take 1 tablet (20 mg total) by mouth 2 (two) times daily. 180 tablet 4  . metFORMIN (GLUCOPHAGE-XR) 750 MG 24 hr tablet Take 1 tablet (750 mg total) by mouth 2 (two) times daily. 180 tablet 4  . omeprazole (PRILOSEC) 40 MG capsule Take 1 capsule (40 mg total) by mouth daily. 90 capsule 4  . predniSONE (DELTASONE) 1 MG tablet Take 3 mg by mouth daily.    . raloxifene (EVISTA) 60 MG tablet Take 1 tablet (60 mg total) by mouth daily. 90 tablet 4   Current Facility-Administered Medications on File Prior to Visit  Medication Dose Route Frequency Provider Last Rate Last Dose  . cyanocobalamin ((VITAMIN B-12)) injection 1,000 mcg  1,000 mcg Intramuscular Q30 days  Claretta Fraise, MD   1,000 mcg at 11/19/14 0901    ROS Review of Systems  Constitutional: Negative for fever, chills, diaphoresis, appetite change, fatigue and unexpected weight change.  HENT: Negative for congestion, ear pain, hearing loss, postnasal drip, rhinorrhea, sneezing, sore throat and trouble swallowing.   Eyes: Negative for pain.  Respiratory: Negative for cough, chest tightness and  shortness of breath.   Cardiovascular: Negative for chest pain and palpitations.  Gastrointestinal: Negative for nausea, vomiting, abdominal pain, diarrhea and constipation.  Genitourinary: Negative for dysuria, frequency and menstrual problem.  Musculoskeletal: Negative for joint swelling and arthralgias.  Skin: Negative for rash.  Neurological: Negative for dizziness, weakness, numbness and headaches.  Psychiatric/Behavioral: Negative for dysphoric mood and agitation.    Objective:  BP 118/77 mmHg  Pulse 77  Temp(Src) 97.4 F (36.3 C) (Oral)  Ht 5' 5"  (1.651 m)  Wt 207 lb 3.2 oz (93.985 kg)  BMI 34.48 kg/m2  LMP 04/11/1986  BP Readings from Last 3 Encounters:  11/19/14 118/77  08/18/14 117/70  05/19/14 139/83    Wt Readings from Last 3 Encounters:  11/19/14 207 lb 3.2 oz (93.985 kg)  08/18/14 205 lb 6.4 oz (93.169 kg)  05/19/14 205 lb 12.8 oz (93.35 kg)     Physical Exam  Constitutional: She is oriented to person, place, and time. She appears well-developed and well-nourished. No distress.  HENT:  Head: Normocephalic and atraumatic.  Right Ear: External ear normal.  Left Ear: External ear normal.  Nose: Nose normal.  Mouth/Throat: Oropharynx is clear and moist.  Eyes: Conjunctivae and EOM are normal. Pupils are equal, round, and reactive to light.  Neck: Normal range of motion. Neck supple. No thyromegaly present.  Cardiovascular: Normal rate, regular rhythm and normal heart sounds.   No murmur heard. Pulmonary/Chest: Effort normal and breath sounds normal. No respiratory distress. She has no wheezes. She has no rales.  Abdominal: Soft. Bowel sounds are normal. She exhibits no distension. There is no tenderness.  Lymphadenopathy:    She has no cervical adenopathy.  Neurological: She is alert and oriented to person, place, and time. She has normal reflexes.  Skin: Skin is warm and dry.  Psychiatric: She has a normal mood and affect. Her behavior is normal. Judgment  and thought content normal.    Lab Results  Component Value Date   HGBA1C 6.1 11/19/2014   HGBA1C 5.9 08/18/2014   HGBA1C 6.0% 05/19/2014    Lab Results  Component Value Date   WBC 6.3 11/19/2014   HGB 10.5* 08/18/2014   HCT 31.8* 11/19/2014   GLUCOSE 121* 11/19/2014   CHOL 147 11/19/2014   TRIG 75 11/19/2014   HDL 62 11/19/2014   LDLCALC 70 11/19/2014   ALT 14 11/19/2014   AST 19 11/19/2014   NA 142 11/19/2014   K 4.5 11/19/2014   CL 108 11/19/2014   CREATININE 1.49* 11/19/2014   BUN 20 11/19/2014   CO2 19 11/19/2014   TSH 0.694 08/18/2014   HGBA1C 6.1 11/19/2014    Patient was never admitted.  Assessment & Plan:   Diagnoses and all orders for this visit:  B12 deficiency anemia -     Cancel: POCT CBC -     CBC with Differential/Platelet  Hyperlipemia -     Lipid panel -     CBC with Differential/Platelet  Other specified hypothyroidism -     CMP14+EGFR -     CBC with Differential/Platelet  Diabetes mellitus type 2, controlled, without complications -  POCT glycosylated hemoglobin (Hb A1C) -     CBC with Differential/Platelet   I am having Erin Ortiz maintain her ONE TOUCH ULTRA SYSTEM KIT, glucose blood, Calcium Carbonate-Vitamin D, Vitamin D, cyanocobalamin, predniSONE, atorvastatin, levothyroxine, lisinopril, metFORMIN, omeprazole, and raloxifene. We administered cyanocobalamin. We will continue to administer cyanocobalamin.  No orders of the defined types were placed in this encounter.     Follow-up: Return in about 3 months (around 02/19/2015).  Claretta Fraise, M.D.

## 2014-11-24 LAB — SPECIMEN STATUS REPORT

## 2014-11-24 LAB — FERRITIN: Ferritin: 451 ng/mL — ABNORMAL HIGH (ref 15–150)

## 2014-11-24 LAB — IRON AND TIBC
Iron Saturation: 28 % (ref 15–55)
Iron: 74 ug/dL (ref 27–139)
Total Iron Binding Capacity: 263 ug/dL (ref 250–450)
UIBC: 189 ug/dL (ref 118–369)

## 2014-11-24 LAB — VITAMIN B12: Vitamin B-12: 564 pg/mL (ref 211–946)

## 2014-12-23 ENCOUNTER — Ambulatory Visit (INDEPENDENT_AMBULATORY_CARE_PROVIDER_SITE_OTHER): Payer: Medicare Other | Admitting: *Deleted

## 2014-12-23 DIAGNOSIS — D519 Vitamin B12 deficiency anemia, unspecified: Secondary | ICD-10-CM | POA: Diagnosis not present

## 2014-12-23 DIAGNOSIS — E538 Deficiency of other specified B group vitamins: Secondary | ICD-10-CM | POA: Diagnosis not present

## 2014-12-23 NOTE — Progress Notes (Signed)
Pt given B12 injection IM left deltoid and tolerated well. °

## 2014-12-23 NOTE — Patient Instructions (Signed)

## 2015-02-22 NOTE — Progress Notes (Signed)
Subjective:  [name], female    DOB: November 01, 1945  Age: 69 y.o. MRN: [id number]  CC: Hyperlipidemia, diabetes, hypothyroidism and GERD  HPI Erin Ortiz presents for  follow-up of hypertension. Patient has no history of headache chest pain or shortness of breath or recent cough. Patient also denies symptoms of TIA such as numbness weakness lateralizing. Patient checks  blood pressure at home and has not had any elevated readings recently. Patient denies side effects from his medication. States taking it regularly.  Patient also  in for follow-up of elevated cholesterol. Doing well without complaints on current medication. Denies side effects of statin including myalgia and arthralgia and nausea. Also in today for liver function testing. Currently no chest pain, shortness of breath or other cardiovascular related symptoms noted.  Follow-up of diabetes. Patient does check blood sugar at home. Readings run between 105 and 120 Patient denies symptoms such as polyuria, polydipsia, excessive hunger, nausea A few significant hypoglycemic spells noted.As low as 50. Felt "off" blurred vision, shaky and irritable. Relieved with glucose tab. Medications as noted below. Taking them regularly without complication/adverse reaction being reported today.   Patient presents for follow-up on  thyroid. She has a history of hypothyroidism for many years. It has been stable recently. Pt. denies any change in  voice, loss of hair, heat or cold intolerance. Energy level has been adequate to good. She denies constipation and diarrhea. No myxedema. Medication is as noted below. Verified that pt is taking it daily on an empty stomach. Well tolerated.  Under Tx for for B12 deficiency. Level fell to 300s with hematology. Added 1000 IU p.o. Also Hb down to 10.7. History Erin Ortiz has no past medical history on file.   She has no past surgical history on file.   Her family history is not on file.She  reports that she has never smoked. She does not have any smokeless tobacco history on file. She reports that she does not drink alcohol or use illicit drugs.  Current Outpatient Prescriptions on File Prior to Visit  Medication Sig Dispense Refill  . atorvastatin (LIPITOR) 40 MG tablet Take 1 tablet (40 mg total) by mouth daily. 90 tablet 4  . Blood Glucose Monitoring Suppl (ONE TOUCH ULTRA SYSTEM KIT) W/DEVICE KIT 1 kit by Does not apply route once. 1 each 0  . Calcium Carbonate-Vitamin D 600-125 MG-UNIT TABS Take 2 tablets by mouth daily.    . Cholecalciferol (VITAMIN D) 2000 UNITS tablet Take 2,000 Units by mouth daily.    . cyanocobalamin (,VITAMIN B-12,) 1000 MCG/ML injection Inject 1,000 mcg into the muscle every 30 (thirty) days.    Marland Kitchen glucose blood test strip Use as instructed 100 each 12  . levothyroxine (SYNTHROID, LEVOTHROID) 125 MCG tablet Take 1 tablet (125 mcg total) by mouth daily. 90 tablet 4  . lisinopril (PRINIVIL,ZESTRIL) 20 MG tablet Take 1 tablet (20 mg total) by mouth 2 (two) times daily. 180 tablet 4  . metFORMIN (GLUCOPHAGE-XR) 750 MG 24 hr tablet Take 1 tablet (750 mg total) by mouth 2 (two) times daily. 180 tablet 4  . omeprazole (PRILOSEC) 40 MG capsule Take 1 capsule (40 mg total) by mouth daily. 90 capsule 4  . predniSONE (DELTASONE) 1 MG tablet Take 3 mg by mouth daily.    . raloxifene (EVISTA) 60 MG tablet Take 1 tablet (60 mg total) by mouth daily. 90 tablet 4   Current Facility-Administered Medications on File Prior to Visit  Medication Dose Route Frequency Provider  Last Rate Last Dose  . cyanocobalamin ((VITAMIN B-12)) injection 1,000 mcg  1,000 mcg Intramuscular Q30 days Claretta Fraise, MD   1,000 mcg at 02/23/15 0832    ROS Review of Systems  Constitutional: Negative for fever, chills, diaphoresis, appetite change, fatigue and unexpected weight change.  HENT: Negative for congestion, ear pain, hearing loss, postnasal drip, rhinorrhea, sneezing, sore throat  and trouble swallowing.   Eyes: Negative for pain.  Respiratory: Negative for cough, chest tightness and shortness of breath.   Cardiovascular: Negative for chest pain and palpitations.  Gastrointestinal: Negative for nausea, vomiting, abdominal pain, diarrhea and constipation.  Genitourinary: Negative for dysuria, frequency and menstrual problem.  Musculoskeletal: Negative for joint swelling and arthralgias.  Skin: Negative for rash.  Neurological: Negative for dizziness, weakness, numbness and headaches.  Psychiatric/Behavioral: Negative for dysphoric mood and agitation.    Objective:  BP 126/75 mmHg  Pulse 78  Temp(Src) 96.8 F (36 C) (Oral)  Ht 5' 5"  (1.651 m)  Wt 214 lb 9.6 oz (97.342 kg)  BMI 35.71 kg/m2  SpO2 97%  LMP 04/11/1986  BP Readings from Last 3 Encounters:  02/23/15 126/75  11/19/14 118/77  08/18/14 117/70    Wt Readings from Last 3 Encounters:  02/23/15 214 lb 9.6 oz (97.342 kg)  11/19/14 207 lb 3.2 oz (93.985 kg)  08/18/14 205 lb 6.4 oz (93.169 kg)     Physical Exam  Constitutional: She is oriented to person, place, and time. She appears well-developed and well-nourished. No distress.  HENT:  Head: Normocephalic and atraumatic.  Right Ear: External ear normal.  Left Ear: External ear normal.  Nose: Nose normal.  Mouth/Throat: Oropharynx is clear and moist.  Eyes: Conjunctivae and EOM are normal. Pupils are equal, round, and reactive to light.  Neck: Normal range of motion. Neck supple. No thyromegaly present.  Cardiovascular: Normal rate, regular rhythm and normal heart sounds.   No murmur heard. Pulmonary/Chest: Effort normal and breath sounds normal. No respiratory distress. She has no wheezes. She has no rales.  Abdominal: Soft. Bowel sounds are normal. She exhibits no distension. There is no tenderness.  Lymphadenopathy:    She has no cervical adenopathy.  Neurological: She is alert and oriented to person, place, and time. She has normal  reflexes.  Skin: Skin is warm and dry.  Psychiatric: She has a normal mood and affect. Her behavior is normal. Judgment and thought content normal.    Lab Results  Component Value Date   HGBA1C 6.1 11/19/2014   HGBA1C 5.9 08/18/2014   HGBA1C 6.0% 05/19/2014    Lab Results  Component Value Date   WBC 6.3 11/19/2014   HGB 10.5* 08/18/2014   HCT 31.8* 11/19/2014   GLUCOSE 121* 11/19/2014   CHOL 147 11/19/2014   TRIG 75 11/19/2014   HDL 62 11/19/2014   LDLCALC 70 11/19/2014   ALT 14 11/19/2014   AST 19 11/19/2014   NA 142 11/19/2014   K 4.5 11/19/2014   CL 108 11/19/2014   CREATININE 1.49* 11/19/2014   BUN 20 11/19/2014   CO2 19 11/19/2014   TSH 0.694 08/18/2014   HGBA1C 6.1 11/19/2014    Patient was never admitted.  Assessment & Plan:   Erin Ortiz was seen today for diabetes, hyperlipidemia, hypothyroidism and gastroesophageal reflux.  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 We administered cyanocobalamin. We will continue to administer cyanocobalamin.  No orders of the defined types were placed in this encounter.     Follow-up: Return in about 3 months (around 05/26/2015) for diabetes, anemia.  Claretta Fraise, M.D.

## 2015-02-23 ENCOUNTER — Encounter: Payer: Self-pay | Admitting: Family Medicine

## 2015-02-23 ENCOUNTER — Ambulatory Visit (INDEPENDENT_AMBULATORY_CARE_PROVIDER_SITE_OTHER): Payer: Medicare Other | Admitting: Family Medicine

## 2015-02-23 VITALS — BP 126/75 | HR 78 | Temp 96.8°F | Ht 65.0 in | Wt 214.6 lb

## 2015-02-23 DIAGNOSIS — E119 Type 2 diabetes mellitus without complications: Secondary | ICD-10-CM

## 2015-02-23 DIAGNOSIS — M353 Polymyalgia rheumatica: Secondary | ICD-10-CM

## 2015-02-23 DIAGNOSIS — Z23 Encounter for immunization: Secondary | ICD-10-CM

## 2015-02-23 DIAGNOSIS — D509 Iron deficiency anemia, unspecified: Secondary | ICD-10-CM | POA: Diagnosis not present

## 2015-02-23 DIAGNOSIS — M316 Other giant cell arteritis: Secondary | ICD-10-CM

## 2015-02-23 DIAGNOSIS — E538 Deficiency of other specified B group vitamins: Secondary | ICD-10-CM

## 2015-02-23 DIAGNOSIS — Z780 Asymptomatic menopausal state: Secondary | ICD-10-CM | POA: Diagnosis not present

## 2015-02-23 DIAGNOSIS — E785 Hyperlipidemia, unspecified: Secondary | ICD-10-CM | POA: Diagnosis not present

## 2015-02-23 DIAGNOSIS — I10 Essential (primary) hypertension: Secondary | ICD-10-CM | POA: Diagnosis not present

## 2015-02-23 DIAGNOSIS — E559 Vitamin D deficiency, unspecified: Secondary | ICD-10-CM | POA: Diagnosis not present

## 2015-02-23 LAB — POCT GLYCOSYLATED HEMOGLOBIN (HGB A1C): HEMOGLOBIN A1C: 5.9

## 2015-02-23 NOTE — Addendum Note (Signed)
Addended by: Prescott Gum on: 02/23/2015 09:37 AM   Modules accepted: Orders

## 2015-02-23 NOTE — Patient Instructions (Signed)
Monitor glucose daily. Record on log sheet.   Check fasting - before breakfast, and again 2 hours after the biggest meal of the day.

## 2015-02-23 NOTE — Addendum Note (Signed)
Addended by: Prescott Gum on: 02/23/2015 09:17 AM   Modules accepted: Orders

## 2015-02-24 LAB — CMP14+EGFR
A/G RATIO: 1.9 (ref 1.1–2.5)
ALK PHOS: 82 IU/L (ref 39–117)
ALT: 16 IU/L (ref 0–32)
AST: 18 IU/L (ref 0–40)
Albumin: 3.9 g/dL (ref 3.5–4.8)
BUN/Creatinine Ratio: 17 (ref 11–26)
BUN: 23 mg/dL (ref 8–27)
Bilirubin Total: 0.4 mg/dL (ref 0.0–1.2)
CO2: 23 mmol/L (ref 18–29)
Calcium: 8.6 mg/dL — ABNORMAL LOW (ref 8.7–10.3)
Chloride: 104 mmol/L (ref 97–106)
Creatinine, Ser: 1.38 mg/dL — ABNORMAL HIGH (ref 0.57–1.00)
GFR calc Af Amer: 45 mL/min/{1.73_m2} — ABNORMAL LOW (ref >59)
GFR calc non Af Amer: 39 mL/min/{1.73_m2} — ABNORMAL LOW (ref >59)
GLOBULIN, TOTAL: 2.1 g/dL (ref 1.5–4.5)
Glucose: 131 mg/dL — ABNORMAL HIGH (ref 65–99)
POTASSIUM: 4.5 mmol/L (ref 3.5–5.2)
SODIUM: 141 mmol/L (ref 136–144)
Total Protein: 6 g/dL (ref 6.0–8.5)

## 2015-02-24 LAB — LIPID PANEL
CHOLESTEROL TOTAL: 159 mg/dL (ref 100–199)
Chol/HDL Ratio: 2.9 ratio units (ref 0.0–4.4)
HDL: 55 mg/dL (ref >39)
LDL Calculated: 80 mg/dL (ref 0–99)
TRIGLYCERIDES: 122 mg/dL (ref 0–149)
VLDL Cholesterol Cal: 24 mg/dL (ref 5–40)

## 2015-02-24 LAB — CBC WITH DIFFERENTIAL/PLATELET
Basophils Absolute: 0.1 10*3/uL (ref 0.0–0.2)
Basos: 1 %
EOS (ABSOLUTE): 0.1 10*3/uL (ref 0.0–0.4)
EOS: 1 %
HEMATOCRIT: 32.5 % — AB (ref 34.0–46.6)
HEMOGLOBIN: 10.5 g/dL — AB (ref 11.1–15.9)
Immature Grans (Abs): 0 10*3/uL (ref 0.0–0.1)
Immature Granulocytes: 0 %
LYMPHS ABS: 1.3 10*3/uL (ref 0.7–3.1)
Lymphs: 12 %
MCH: 28.7 pg (ref 26.6–33.0)
MCHC: 32.3 g/dL (ref 31.5–35.7)
MCV: 89 fL (ref 79–97)
MONOCYTES: 5 %
Monocytes Absolute: 0.5 10*3/uL (ref 0.1–0.9)
NEUTROS ABS: 8.2 10*3/uL — AB (ref 1.4–7.0)
Neutrophils: 81 %
Platelets: 227 10*3/uL (ref 150–379)
RBC: 3.66 x10E6/uL — AB (ref 3.77–5.28)
RDW: 15 % (ref 12.3–15.4)
WBC: 10.2 10*3/uL (ref 3.4–10.8)

## 2015-02-24 LAB — VITAMIN D 25 HYDROXY (VIT D DEFICIENCY, FRACTURES): VIT D 25 HYDROXY: 24.1 ng/mL — AB (ref 30.0–100.0)

## 2015-02-24 LAB — SEDIMENTATION RATE: SED RATE: 6 mm/h (ref 0–40)

## 2015-02-27 ENCOUNTER — Other Ambulatory Visit: Payer: Medicare Other

## 2015-03-27 ENCOUNTER — Ambulatory Visit (INDEPENDENT_AMBULATORY_CARE_PROVIDER_SITE_OTHER): Payer: Medicare Other

## 2015-03-27 ENCOUNTER — Ambulatory Visit (INDEPENDENT_AMBULATORY_CARE_PROVIDER_SITE_OTHER): Payer: Medicare Other | Admitting: *Deleted

## 2015-03-27 DIAGNOSIS — E119 Type 2 diabetes mellitus without complications: Secondary | ICD-10-CM

## 2015-03-27 DIAGNOSIS — D509 Iron deficiency anemia, unspecified: Secondary | ICD-10-CM

## 2015-03-27 DIAGNOSIS — E785 Hyperlipidemia, unspecified: Secondary | ICD-10-CM

## 2015-03-27 DIAGNOSIS — E538 Deficiency of other specified B group vitamins: Secondary | ICD-10-CM | POA: Diagnosis not present

## 2015-03-27 DIAGNOSIS — M353 Polymyalgia rheumatica: Secondary | ICD-10-CM

## 2015-03-27 DIAGNOSIS — E559 Vitamin D deficiency, unspecified: Secondary | ICD-10-CM

## 2015-03-27 DIAGNOSIS — I10 Essential (primary) hypertension: Secondary | ICD-10-CM

## 2015-03-27 DIAGNOSIS — Z780 Asymptomatic menopausal state: Secondary | ICD-10-CM

## 2015-03-27 NOTE — Progress Notes (Signed)
Pt given B12 injection IM left deltoid and tolerated well. °

## 2015-03-27 NOTE — Patient Instructions (Signed)

## 2015-04-27 ENCOUNTER — Ambulatory Visit: Payer: Medicare Other

## 2015-05-04 ENCOUNTER — Ambulatory Visit (INDEPENDENT_AMBULATORY_CARE_PROVIDER_SITE_OTHER): Payer: Medicare Other

## 2015-05-04 DIAGNOSIS — D519 Vitamin B12 deficiency anemia, unspecified: Secondary | ICD-10-CM

## 2015-05-04 DIAGNOSIS — E538 Deficiency of other specified B group vitamins: Secondary | ICD-10-CM | POA: Diagnosis not present

## 2015-05-21 LAB — HM DIABETES EYE EXAM

## 2015-05-22 ENCOUNTER — Ambulatory Visit: Payer: Medicare Other | Admitting: Family Medicine

## 2015-05-28 ENCOUNTER — Encounter: Payer: Self-pay | Admitting: *Deleted

## 2015-05-29 ENCOUNTER — Ambulatory Visit (INDEPENDENT_AMBULATORY_CARE_PROVIDER_SITE_OTHER): Payer: Medicare Other | Admitting: Family Medicine

## 2015-05-29 ENCOUNTER — Encounter: Payer: Self-pay | Admitting: Family Medicine

## 2015-05-29 VITALS — BP 107/67 | HR 83 | Temp 97.9°F | Ht 65.0 in | Wt 211.0 lb

## 2015-05-29 DIAGNOSIS — I10 Essential (primary) hypertension: Secondary | ICD-10-CM | POA: Diagnosis not present

## 2015-05-29 DIAGNOSIS — E1122 Type 2 diabetes mellitus with diabetic chronic kidney disease: Secondary | ICD-10-CM

## 2015-05-29 DIAGNOSIS — E538 Deficiency of other specified B group vitamins: Secondary | ICD-10-CM

## 2015-05-29 DIAGNOSIS — N183 Chronic kidney disease, stage 3 (moderate): Secondary | ICD-10-CM | POA: Diagnosis not present

## 2015-05-29 DIAGNOSIS — E785 Hyperlipidemia, unspecified: Secondary | ICD-10-CM | POA: Diagnosis not present

## 2015-05-29 DIAGNOSIS — K219 Gastro-esophageal reflux disease without esophagitis: Secondary | ICD-10-CM | POA: Insufficient documentation

## 2015-05-29 DIAGNOSIS — E039 Hypothyroidism, unspecified: Secondary | ICD-10-CM

## 2015-05-29 DIAGNOSIS — M316 Other giant cell arteritis: Secondary | ICD-10-CM | POA: Diagnosis not present

## 2015-05-29 DIAGNOSIS — M353 Polymyalgia rheumatica: Secondary | ICD-10-CM

## 2015-05-29 LAB — POCT URINALYSIS DIPSTICK
BILIRUBIN UA: NEGATIVE
Glucose, UA: NEGATIVE
KETONES UA: NEGATIVE
Leukocytes, UA: NEGATIVE
NITRITE UA: NEGATIVE
PH UA: 5
Protein, UA: NEGATIVE
RBC UA: NEGATIVE
Urobilinogen, UA: NEGATIVE

## 2015-05-29 LAB — POCT GLYCOSYLATED HEMOGLOBIN (HGB A1C): HEMOGLOBIN A1C: 6.3

## 2015-05-29 MED ORDER — FAMOTIDINE 40 MG PO TABS: 40.0000 mg | ORAL_TABLET | Freq: Every day | ORAL | Status: DC

## 2015-05-29 NOTE — Progress Notes (Signed)
Subjective:  [name], female    DOB: 22-Oct-1945  Age: 70 y.o. MRN: [id number]  CC: No chief complaint on file.   HPI Erin Ortiz presents for  follow-up of hypertension. Patient has no history of headache chest pain or shortness of breath or recent cough. Patient also denies symptoms of TIA such as numbness weakness lateralizing. Patient checks  blood pressure at home and has not had any elevated readings recently. Patient denies side effects from his medication. States taking it regularly.  Patient also  in for follow-up of elevated cholesterol. Doing well without complaints on current medication. Denies side effects of statin including myalgia and arthralgia and nausea. Also in today for liver function testing. Currently no chest pain, shortness of breath or other cardiovascular related symptoms noted.  Follow-up of diabetes. Patient does check blood sugar at home. Readings run between 90 and 120 Patient denies symptoms such as polyuria, polydipsia, excessive hunger, nausea No significant hypoglycemic spells noted. Medications as noted below. Taking them regularly without complication/adverse reaction being reported today.   Pt. Would like trial of H2 blocker for reflux due to Renal insufficiency. Concerned about PPI   History Erin Ortiz has no past medical history on file.   She has no past surgical history on file.   Her family history is not on file.She reports that she has never smoked. She does not have any smokeless tobacco history on file. She reports that she does not drink alcohol or use illicit drugs.  Current Outpatient Prescriptions on File Prior to Visit  Medication Sig Dispense Refill  . atorvastatin (LIPITOR) 40 MG tablet Take 1 tablet (40 mg total) by mouth daily. 90 tablet 4  . Blood Glucose Monitoring Suppl (ONE TOUCH ULTRA SYSTEM KIT) W/DEVICE KIT 1 kit by Does not apply route once. 1 each 0  . Calcium Carbonate-Vitamin D 600-125 MG-UNIT TABS Take  2 tablets by mouth daily.    . Cholecalciferol (VITAMIN D) 2000 UNITS tablet Take 2,000 Units by mouth daily.    . cyanocobalamin (,VITAMIN B-12,) 1000 MCG/ML injection Inject 1,200 mcg into the muscle every 30 (thirty) days.    [place] Kitchen glucose blood test strip Use as instructed 100 each 12  . levothyroxine (SYNTHROID, LEVOTHROID) 125 MCG tablet Take 1 tablet (125 mcg total) by mouth daily. 90 tablet 4  . lisinopril (PRINIVIL,ZESTRIL) 20 MG tablet Take 1 tablet (20 mg total) by mouth 2 (two) times daily. 180 tablet 4  . metFORMIN (GLUCOPHAGE-XR) 750 MG 24 hr tablet Take 1 tablet (750 mg total) by mouth 2 (two) times daily. 180 tablet 4  . predniSONE (DELTASONE) 1 MG tablet Take 3 mg by mouth daily.    . raloxifene (EVISTA) 60 MG tablet Take 1 tablet (60 mg total) by mouth daily. 90 tablet 4   Current Facility-Administered Medications on File Prior to Visit  Medication Dose Route Frequency Provider Last Rate Last Dose  . cyanocobalamin ((VITAMIN B-12)) injection 1,000 mcg  1,000 mcg Intramuscular Q30 days Claretta Fraise, MD   1,000 mcg at 05/04/15 0956    ROS Review of Systems  Constitutional: Negative for fever, activity change and appetite change.  HENT: Negative for congestion, rhinorrhea and sore throat.   Eyes: Negative for visual disturbance.  Respiratory: Negative for cough and shortness of breath.   Cardiovascular: Negative for chest pain and palpitations.  Gastrointestinal: Negative for nausea, abdominal pain and diarrhea.  Genitourinary: Negative for dysuria.  Musculoskeletal: Negative for myalgias and arthralgias.    Objective:  BP 107/67 mmHg  Pulse 83  Temp(Src) 97.9 F (36.6 C) (Oral)  Ht _0  (1.651 m)  Wt 211 lb (95.709 kg)  BMI 35.11 kg/m2  LMP 04/11/1986  BP Readings from Last 3 Encounters:  05/29/15 107/67  02/23/15 126/75  11/19/14 118/77    Wt Readings from Last 3 Encounters:  05/29/15 211 lb (95.709 kg)  02/23/15 214 lb 9.6 oz (97.342 kg)  11/19/14 207 lb  3.2 oz (93.985 kg)     Physical Exam  Constitutional: She is oriented to person, place, and time. She appears well-developed and well-nourished. No distress.  HENT:  Head: Normocephalic and atraumatic.  Right Ear: External ear normal.  Left Ear: External ear normal.  Nose: Nose normal.  Mouth/Throat: Oropharynx is clear and moist.  Eyes: Conjunctivae and EOM are normal. Pupils are equal, round, and reactive to light.  Neck: Normal range of motion. Neck supple. No thyromegaly present.  Cardiovascular: Normal rate, regular rhythm and normal heart sounds.   No murmur heard. Pulmonary/Chest: Effort normal and breath sounds normal. No respiratory distress. She has no wheezes. She has no rales.  Abdominal: Soft. Bowel sounds are normal. She exhibits no distension. There is no tenderness.  Lymphadenopathy:    She has no cervical adenopathy.  Neurological: She is alert and oriented to person, place, and time. She has normal reflexes.  Skin: Skin is warm and dry.  Psychiatric: She has a normal mood and affect. Her behavior is normal. Judgment and thought content normal.   Diabetic Foot Exam - Simple   Simple Foot Form  Visual Inspection  No deformities, no ulcerations, no other skin breakdown bilaterally:  Yes  Sensation Testing  Intact to touch and monofilament testing bilaterally:  Yes  Pulse Check  Posterior Tibialis and Dorsalis pulse intact bilaterally:  Yes  Comments      Lab Results  Component Value Date   HGBA1C 5.9 02/23/2015   HGBA1C 6.1 11/19/2014   HGBA1C 5.9 08/18/2014    Lab Results  Component Value Date   WBC 10.2 02/23/2015   HGB 10.5* 08/18/2014   HCT 32.5* 02/23/2015   PLT 227 02/23/2015   GLUCOSE 131* 02/23/2015   CHOL 159 02/23/2015   TRIG 122 02/23/2015   HDL 55 02/23/2015   LDLCALC 80 02/23/2015   ALT 16 02/23/2015   AST 18 02/23/2015   NA 141 02/23/2015   K 4.5 02/23/2015   CL 104 02/23/2015   CREATININE 1.38* 02/23/2015   BUN 23 02/23/2015    CO2 23 02/23/2015   TSH 0.694 08/18/2014   HGBA1C 5.9 02/23/2015    Patient was never admitted.  Assessment & Plan:   Diagnoses and all orders for this visit:  Anarthritic rheumatoid disease ([place]) -     CMP14+EGFR -     POCT urinalysis dipstick -     CBC with Differential/Platelet  Type 2 diabetes mellitus with stage 3 chronic kidney disease, without long-term current use of insulin (H[facility]) -     POCT glycosylated hemoglobin (Hb A1C) -     CMP14+EGFR -     Microalbumin / creatinine urine ratio -     POCT urinalysis dipstick -     CBC with Differential/Platelet  HLD (hyperlipidemia) -     CMP14+EGFR -     Lipid panel -     POCT urinalysis dipstick -     CBC with Differential/Platelet  Essential (primary) hypertension -     CMP14+EGFR -     POCT urinalysis  dipstick -     CBC with Differential/Platelet  Gastroesophageal reflux disease without esophagitis -     CMP14+EGFR -     POCT urinalysis dipstick -     CBC with Differential/Platelet  Hypothyroidism, unspecified hypothyroidism type -     TSH + free T4  B12 deficiency  Other orders -     famotidine (PEPCID) 40 MG tablet; Take 1 tablet (40 mg total) by mouth daily.   I have discontinued Erin Ortiz's omeprazole. I am also having her start on famotidine. Additionally, I am having her maintain her [place] KIT, glucose blood, Calcium Carbonate-Vitamin D, Vitamin D, cyanocobalamin, predniSONE, atorvastatin, levothyroxine, lisinopril, metFORMIN, and raloxifene. We will continue to administer cyanocobalamin.  Meds ordered this encounter  Medications  . famotidine (PEPCID) 40 MG tablet    Sig: Take 1 tablet (40 mg total) by mouth daily.    Dispense:  90 tablet    Refill:  3     Follow-up: Return in about 3 months (around 08/26/2015).  Claretta Fraise, M.D.

## 2015-05-30 LAB — CBC WITH DIFFERENTIAL/PLATELET
BASOS ABS: 0 10*3/uL (ref 0.0–0.2)
BASOS: 1 %
EOS (ABSOLUTE): 0.1 10*3/uL (ref 0.0–0.4)
Eos: 1 %
Hematocrit: 34.2 % (ref 34.0–46.6)
Hemoglobin: 10.9 g/dL — ABNORMAL LOW (ref 11.1–15.9)
IMMATURE GRANS (ABS): 0 10*3/uL (ref 0.0–0.1)
IMMATURE GRANULOCYTES: 0 %
LYMPHS: 13 %
Lymphocytes Absolute: 1.1 10*3/uL (ref 0.7–3.1)
MCH: 28.7 pg (ref 26.6–33.0)
MCHC: 31.9 g/dL (ref 31.5–35.7)
MCV: 90 fL (ref 79–97)
Monocytes Absolute: 0.5 10*3/uL (ref 0.1–0.9)
Monocytes: 6 %
NEUTROS PCT: 79 %
Neutrophils Absolute: 6.6 10*3/uL (ref 1.4–7.0)
PLATELETS: 220 10*3/uL (ref 150–379)
RBC: 3.8 x10E6/uL (ref 3.77–5.28)
RDW: 14.8 % (ref 12.3–15.4)
WBC: 8.3 10*3/uL (ref 3.4–10.8)

## 2015-05-30 LAB — CMP14+EGFR
ALK PHOS: 64 IU/L (ref 39–117)
ALT: 16 IU/L (ref 0–32)
AST: 17 IU/L (ref 0–40)
Albumin/Globulin Ratio: 1.7 (ref 1.1–2.5)
Albumin: 3.9 g/dL (ref 3.5–4.8)
BILIRUBIN TOTAL: 0.3 mg/dL (ref 0.0–1.2)
BUN/Creatinine Ratio: 16 (ref 11–26)
BUN: 24 mg/dL (ref 8–27)
CHLORIDE: 106 mmol/L (ref 96–106)
CO2: 22 mmol/L (ref 18–29)
Calcium: 8.4 mg/dL — ABNORMAL LOW (ref 8.7–10.3)
Creatinine, Ser: 1.54 mg/dL — ABNORMAL HIGH (ref 0.57–1.00)
GFR calc Af Amer: 39 mL/min/{1.73_m2} — ABNORMAL LOW (ref >59)
GFR calc non Af Amer: 34 mL/min/{1.73_m2} — ABNORMAL LOW (ref >59)
GLUCOSE: 139 mg/dL — AB (ref 65–99)
Globulin, Total: 2.3 g/dL (ref 1.5–4.5)
POTASSIUM: 4.5 mmol/L (ref 3.5–5.2)
Sodium: 144 mmol/L (ref 134–144)
Total Protein: 6.2 g/dL (ref 6.0–8.5)

## 2015-05-30 LAB — MICROALBUMIN / CREATININE URINE RATIO
Creatinine, Urine: 46.1 mg/dL
MICROALB/CREAT RATIO: <6.5 mg/g creat (ref 0.0–30.0)

## 2015-05-30 LAB — LIPID PANEL
CHOLESTEROL TOTAL: 153 mg/dL (ref 100–199)
Chol/HDL Ratio: 2.8 ratio units (ref 0.0–4.4)
HDL: 54 mg/dL (ref >39)
LDL Calculated: 76 mg/dL (ref 0–99)
Triglycerides: 113 mg/dL (ref 0–149)
VLDL CHOLESTEROL CAL: 23 mg/dL (ref 5–40)

## 2015-05-30 LAB — TSH+FREE T4
FREE T4: 1.4 ng/dL (ref 0.82–1.77)
TSH: 0.349 u[IU]/mL — AB (ref 0.450–4.500)

## 2015-06-01 ENCOUNTER — Telehealth: Payer: Self-pay | Admitting: Family Medicine

## 2015-06-01 MED ORDER — CYANOCOBALAMIN 1000 MCG/ML IJ SOLN: 1200.0000 ug | INTRAMUSCULAR | Status: DC

## 2015-06-01 NOTE — Telephone Encounter (Signed)
Reviewed results with pt, she is asking about the rx for 1200mg  Vitamin B12 rx?

## 2015-06-01 NOTE — Telephone Encounter (Signed)
Pt aware.

## 2015-06-01 NOTE — Telephone Encounter (Signed)
Prescription sent to her pharmacy

## 2015-06-05 ENCOUNTER — Encounter: Payer: Self-pay | Admitting: *Deleted

## 2015-06-09 ENCOUNTER — Encounter: Payer: Self-pay | Admitting: *Deleted

## 2015-06-29 ENCOUNTER — Ambulatory Visit (INDEPENDENT_AMBULATORY_CARE_PROVIDER_SITE_OTHER): Payer: Medicare Other | Admitting: *Deleted

## 2015-06-29 DIAGNOSIS — E538 Deficiency of other specified B group vitamins: Secondary | ICD-10-CM | POA: Diagnosis not present

## 2015-06-29 NOTE — Patient Instructions (Signed)

## 2015-06-29 NOTE — Progress Notes (Signed)
Pt given 1.2 mls B12 Injection IM and tolerated well.

## 2015-07-28 ENCOUNTER — Ambulatory Visit (INDEPENDENT_AMBULATORY_CARE_PROVIDER_SITE_OTHER): Payer: Medicare Other | Admitting: *Deleted

## 2015-07-28 DIAGNOSIS — E538 Deficiency of other specified B group vitamins: Secondary | ICD-10-CM

## 2015-07-28 NOTE — Progress Notes (Signed)
Pt given B12 injection IM right deltoid and tolerated well. 

## 2015-07-28 NOTE — Patient Instructions (Signed)

## 2015-09-01 ENCOUNTER — Ambulatory Visit (INDEPENDENT_AMBULATORY_CARE_PROVIDER_SITE_OTHER): Payer: Medicare Other

## 2015-09-01 DIAGNOSIS — E538 Deficiency of other specified B group vitamins: Secondary | ICD-10-CM

## 2015-09-01 NOTE — Patient Instructions (Addendum)

## 2015-09-01 NOTE — Progress Notes (Signed)
[name], female   DOB: Apr 25, 1945, 70 y.o.   MRN: [id number]  Pt given B12 injection IM left deltoid and tolerated well.

## 2015-09-29 ENCOUNTER — Ambulatory Visit (INDEPENDENT_AMBULATORY_CARE_PROVIDER_SITE_OTHER): Payer: Medicare Other | Admitting: Family

## 2015-09-29 ENCOUNTER — Encounter: Payer: Self-pay | Admitting: Family

## 2015-09-29 VITALS — BP 117/70 | HR 82 | Temp 98.4°F | Ht 65.0 in | Wt 199.0 lb

## 2015-09-29 DIAGNOSIS — E1122 Type 2 diabetes mellitus with diabetic chronic kidney disease: Secondary | ICD-10-CM

## 2015-09-29 DIAGNOSIS — E538 Deficiency of other specified B group vitamins: Secondary | ICD-10-CM | POA: Diagnosis not present

## 2015-09-29 DIAGNOSIS — D509 Iron deficiency anemia, unspecified: Secondary | ICD-10-CM

## 2015-09-29 DIAGNOSIS — E785 Hyperlipidemia, unspecified: Secondary | ICD-10-CM

## 2015-09-29 DIAGNOSIS — M810 Age-related osteoporosis without current pathological fracture: Secondary | ICD-10-CM | POA: Diagnosis not present

## 2015-09-29 DIAGNOSIS — E039 Hypothyroidism, unspecified: Secondary | ICD-10-CM

## 2015-09-29 DIAGNOSIS — E669 Obesity, unspecified: Secondary | ICD-10-CM

## 2015-09-29 DIAGNOSIS — E559 Vitamin D deficiency, unspecified: Secondary | ICD-10-CM

## 2015-09-29 DIAGNOSIS — Z1159 Encounter for screening for other viral diseases: Secondary | ICD-10-CM | POA: Diagnosis not present

## 2015-09-29 DIAGNOSIS — N183 Chronic kidney disease, stage 3 (moderate): Secondary | ICD-10-CM | POA: Diagnosis not present

## 2015-09-29 DIAGNOSIS — I10 Essential (primary) hypertension: Secondary | ICD-10-CM

## 2015-09-29 DIAGNOSIS — K219 Gastro-esophageal reflux disease without esophagitis: Secondary | ICD-10-CM | POA: Diagnosis not present

## 2015-09-29 LAB — BAYER DCA HB A1C WAIVED: HB A1C: 6.2 % (ref <7.0)

## 2015-09-29 NOTE — Patient Instructions (Signed)
Health Maintenance, Female Adopting a healthy lifestyle and getting preventive care can go a long way to promote health and wellness. Talk with your health care provider about what schedule of regular examinations is right for you. This is a good chance for you to check in with your provider about disease prevention and staying healthy. In between checkups, there are plenty of things you can do on your own. Experts have done a lot of research about which lifestyle changes and preventive measures are most likely to keep you healthy. Ask your health care provider for more information. WEIGHT AND DIET  Eat a healthy diet  Be sure to include plenty of vegetables, fruits, low-fat dairy products, and lean protein.  Do not eat a lot of foods high in solid fats, added sugars, or salt.  Get regular exercise. This is one of the most important things you can do for your health.  Most adults should exercise for at least 150 minutes each week. The exercise should increase your heart rate and make you sweat (moderate-intensity exercise).  Most adults should also do strengthening exercises at least twice a week. This is in addition to the moderate-intensity exercise.  Maintain a healthy weight  Body mass index (BMI) is a measurement that can be used to identify possible weight problems. It estimates body fat based on height and weight. Your health care provider can help determine your BMI and help you achieve or maintain a healthy weight.  For females 20 years of age and older:   A BMI below 18.5 is considered underweight.  A BMI of 18.5 to 24.9 is normal.  A BMI of 25 to 29.9 is considered overweight.  A BMI of 30 and above is considered obese.  Watch levels of cholesterol and blood lipids  You should start having your blood tested for lipids and cholesterol at 71 years of age, then have this test every 5 years.  You may need to have your cholesterol levels checked more often if:  Your lipid  or cholesterol levels are high.  You are older than 71 years of age.  You are at high risk for heart disease.  CANCER SCREENING   Lung Cancer  Lung cancer screening is recommended for adults 55-80 years old who are at high risk for lung cancer because of a history of smoking.  A yearly low-dose CT scan of the lungs is recommended for people who:  Currently smoke.  Have quit within the past 15 years.  Have at least a 30-pack-year history of smoking. A pack year is smoking an average of one pack of cigarettes a day for 1 year.  Yearly screening should continue until it has been 15 years since you quit.  Yearly screening should stop if you develop a health problem that would prevent you from having lung cancer treatment.  Breast Cancer  Practice breast self-awareness. This means understanding how your breasts normally appear and feel.  It also means doing regular breast self-exams. Let your health care provider know about any changes, no matter how small.  If you are in your 20s or 30s, you should have a clinical breast exam (CBE) by a health care provider every 1-3 years as part of a regular health exam.  If you are 40 or older, have a CBE every year. Also consider having a breast X-ray (mammogram) every year.  If you have a family history of breast cancer, talk to your health care provider about genetic screening.  If you   are at high risk for breast cancer, talk to your health care provider about having an MRI and a mammogram every year.  Breast cancer gene (BRCA) assessment is recommended for women who have family members with BRCA-related cancers. BRCA-related cancers include:  Breast.  Ovarian.  Tubal.  Peritoneal cancers.  Results of the assessment will determine the need for genetic counseling and BRCA1 and BRCA2 testing. Cervical Cancer Your health care provider may recommend that you be screened regularly for cancer of the pelvic organs (ovaries, uterus, and  vagina). This screening involves a pelvic examination, including checking for microscopic changes to the surface of your cervix (Pap test). You may be encouraged to have this screening done every 3 years, beginning at age 21.  For women ages 30-65, health care providers may recommend pelvic exams and Pap testing every 3 years, or they may recommend the Pap and pelvic exam, combined with testing for human papilloma virus (HPV), every 5 years. Some types of HPV increase your risk of cervical cancer. Testing for HPV may also be done on women of any age with unclear Pap test results.  Other health care providers may not recommend any screening for nonpregnant women who are considered low risk for pelvic cancer and who do not have symptoms. Ask your health care provider if a screening pelvic exam is right for you.  If you have had past treatment for cervical cancer or a condition that could lead to cancer, you need Pap tests and screening for cancer for at least 20 years after your treatment. If Pap tests have been discontinued, your risk factors (such as having a new sexual partner) need to be reassessed to determine if screening should resume. Some women have medical problems that increase the chance of getting cervical cancer. In these cases, your health care provider may recommend more frequent screening and Pap tests. Colorectal Cancer  This type of cancer can be detected and often prevented.  Routine colorectal cancer screening usually begins at 71 years of age and continues through 71 years of age.  Your health care provider may recommend screening at an earlier age if you have risk factors for colon cancer.  Your health care provider may also recommend using home test kits to check for hidden blood in the stool.  A small camera at the end of a tube can be used to examine your colon directly (sigmoidoscopy or colonoscopy). This is done to check for the earliest forms of colorectal  cancer.  Routine screening usually begins at age 50.  Direct examination of the colon should be repeated every 5-10 years through 71 years of age. However, you may need to be screened more often if early forms of precancerous polyps or small growths are found. Skin Cancer  Check your skin from head to toe regularly.  Tell your health care provider about any new moles or changes in moles, especially if there is a change in a mole's shape or color.  Also tell your health care provider if you have a mole that is larger than the size of a pencil eraser.  Always use sunscreen. Apply sunscreen liberally and repeatedly throughout the day.  Protect yourself by wearing long sleeves, pants, a wide-brimmed hat, and sunglasses whenever you are outside. HEART DISEASE, DIABETES, AND HIGH BLOOD PRESSURE   High blood pressure causes heart disease and increases the risk of stroke. High blood pressure is more likely to develop in:  People who have blood pressure in the high end   of the normal range (130-139/85-89 mm Hg).  People who are overweight or obese.  People who are African American.  If you are 38-23 years of age, have your blood pressure checked every 3-5 years. If you are 61 years of age or older, have your blood pressure checked every year. You should have your blood pressure measured twice--once when you are at a hospital or clinic, and once when you are not at a hospital or clinic. Record the average of the two measurements. To check your blood pressure when you are not at a hospital or clinic, you can use:  An automated blood pressure machine at a pharmacy.  A home blood pressure monitor.  If you are between 45 years and 39 years old, ask your health care provider if you should take aspirin to prevent strokes.  Have regular diabetes screenings. This involves taking a blood sample to check your fasting blood sugar level.  If you are at a normal weight and have a low risk for diabetes,  have this test once every three years after 71 years of age.  If you are overweight and have a high risk for diabetes, consider being tested at a younger age or more often. PREVENTING INFECTION  Hepatitis B  If you have a higher risk for hepatitis B, you should be screened for this virus. You are considered at high risk for hepatitis B if:  You were born in a country where hepatitis B is common. Ask your health care provider which countries are considered high risk.  Your parents were born in a high-risk country, and you have not been immunized against hepatitis B (hepatitis B vaccine).  You have HIV or AIDS.  You use needles to inject street drugs.  You live with someone who has hepatitis B.  You have had sex with someone who has hepatitis B.  You get hemodialysis treatment.  You take certain medicines for conditions, including cancer, organ transplantation, and autoimmune conditions. Hepatitis C  Blood testing is recommended for:  Everyone born from [birth year] through 1966.  Anyone with known risk factors for hepatitis C. Sexually transmitted infections (STIs)  You should be screened for sexually transmitted infections (STIs) including gonorrhea and chlamydia if:  You are sexually active and are younger than 71 years of age.  You are older than 71 years of age and your health care provider tells you that you are at risk for this type of infection.  Your sexual activity has changed since you were last screened and you are at an increased risk for chlamydia or gonorrhea. Ask your health care provider if you are at risk.  If you do not have HIV, but are at risk, it may be recommended that you take a prescription medicine daily to prevent HIV infection. This is called pre-exposure prophylaxis (PrEP). You are considered at risk if:  You are sexually active and do not regularly use condoms or know the HIV status of your partner(s).  You take drugs by injection.  You are sexually  active with a partner who has HIV. Talk with your health care provider about whether you are at high risk of being infected with HIV. If you choose to begin PrEP, you should first be tested for HIV. You should then be tested every 3 months for as long as you are taking PrEP.  PREGNANCY   If you are premenopausal and you may become pregnant, ask your health care provider about preconception counseling.  If you may  become pregnant, take 400 to 800 micrograms (mcg) of folic acid every day.  If you want to prevent pregnancy, talk to your health care provider about birth control (contraception). OSTEOPOROSIS AND MENOPAUSE   Osteoporosis is a disease in which the bones lose minerals and strength with aging. This can result in serious bone fractures. Your risk for osteoporosis can be identified using a bone density scan.  If you are 61 years of age or older, or if you are at risk for osteoporosis and fractures, ask your health care provider if you should be screened.  Ask your health care provider whether you should take a calcium or vitamin D supplement to lower your risk for osteoporosis.  Menopause may have certain physical symptoms and risks.  Hormone replacement therapy may reduce some of these symptoms and risks. Talk to your health care provider about whether hormone replacement therapy is right for you.  HOME CARE INSTRUCTIONS   Schedule regular health, dental, and eye exams.  Stay current with your immunizations.   Do not use any tobacco products including cigarettes, chewing tobacco, or electronic cigarettes.  If you are pregnant, do not drink alcohol.  If you are breastfeeding, limit how much and how often you drink alcohol.  Limit alcohol intake to no more than 1 drink per day for nonpregnant women. One drink equals 12 ounces of beer, 5 ounces of wine, or 1 ounces of hard liquor.  Do not use street drugs.  Do not share needles.  Ask your health care provider for help if  you need support or information about quitting drugs.  Tell your health care provider if you often feel depressed.  Tell your health care provider if you have ever been abused or do not feel safe at home.   This information is not intended to replace advice given to you by your health care provider. Make sure you discuss any questions you have with your health care provider.   Document Released: 10/11/2010 Document Revised: 04/18/2014 Document Reviewed: 02/27/2013 Elsevier Interactive Patient Education [employer].

## 2015-09-29 NOTE — Progress Notes (Signed)
Subjective:    [name], female    DOB: 1946-01-05, 70 y.o.   MRN: [id number]  Pt presents to the office today for chronic follow up.  Diabetes She presents for her follow-up diabetic visit. She has type 2 diabetes mellitus. Pertinent negatives for hypoglycemia include no confusion or headaches. Pertinent negatives for diabetes include no blurred vision, no fatigue, no foot ulcerations and no visual change. Pertinent negatives for hypoglycemia complications include no blackouts. Symptoms are stable. Pertinent negatives for diabetic complications include no CVA, heart disease, nephropathy or peripheral neuropathy. Current diabetic treatment includes oral agent (monotherapy). She is compliant with treatment all of the time. Her breakfast blood glucose range is generally 110-130 mg/dl. An ACE inhibitor/angiotensin II receptor blocker is being taken. Eye exam is current.  Gastroesophageal Reflux She reports no belching, no coughing, no heartburn, no hoarse voice or no sore throat. This is a chronic problem. The current episode started more than 1 year ago. The problem occurs rarely. The problem has been resolved. Pertinent negatives include no fatigue. Risk factors include obesity. She has tried a PPI for the symptoms. The treatment provided significant relief.  Hypertension This is a chronic problem. The current episode started more than 1 year ago. The problem has been resolved since onset. The problem is controlled. Pertinent negatives include no blurred vision, headaches, malaise/fatigue, orthopnea, palpitations, peripheral edema or shortness of breath. Risk factors for coronary artery disease include obesity. Past treatments include lifestyle changes. The current treatment provides moderate improvement. Hypertensive end-organ damage includes a thyroid problem. There is no history of kidney disease, CAD/MI, CVA or heart failure.  Hyperlipidemia This is a chronic problem. The current  episode started more than 1 year ago. The problem is controlled. Recent lipid tests were reviewed and are normal. Pertinent negatives include no shortness of breath. Current antihyperlipidemic treatment includes statins. The current treatment provides mild improvement of lipids. Risk factors for coronary artery disease include diabetes mellitus, dyslipidemia, hypertension, obesity, post-menopausal and a sedentary lifestyle.  Thyroid Problem Presents for follow-up visit. Patient reports no constipation, diaphoresis, dry skin, fatigue, hair loss, hoarse voice, palpitations or visual change. The symptoms have been stable. Past treatments include levothyroxine. The treatment provided significant relief. Her past medical history is significant for hyperlipidemia. There is no history of heart failure.  Anemia Presents for follow-up visit. There has been no confusion, malaise/fatigue or palpitations. Signs of blood loss that are not present include hematochezia and vaginal bleeding. Past treatments include oral vitamin B12. There is no history of heart failure.  Osteoporosis  PT currently taking Evista 60 mg daily with calcium and Vit D. PT states her last Dexa scan was 03/27/15.    Review of Systems  Constitutional: Negative.  Negative for malaise/fatigue, diaphoresis and fatigue.  HENT: Negative.  Negative for hoarse voice and sore throat.   Eyes: Negative.  Negative for blurred vision.  Respiratory: Negative.  Negative for cough and shortness of breath.   Cardiovascular: Negative.  Negative for palpitations and orthopnea.  Gastrointestinal: Negative.  Negative for heartburn, constipation and hematochezia.  Endocrine: Negative.   Genitourinary: Negative.  Negative for vaginal bleeding.  Musculoskeletal: Negative.   Neurological: Negative.  Negative for headaches.  Hematological: Negative.   Psychiatric/Behavioral: Negative.  Negative for confusion.  All other systems reviewed and are  negative.      Objective:   Physical Exam  Constitutional: She is oriented to person, place, and time. She appears well-developed and well-nourished. No distress.  HENT:  Head: Normocephalic and atraumatic.  Right Ear: External ear normal.  Left Ear: External ear normal.  Nose: Nose normal.  Mouth/Throat: Oropharynx is clear and moist.  Eyes: Pupils are equal, round, and reactive to light.  Neck: Normal range of motion. Neck supple. No thyromegaly present.  Cardiovascular: Normal rate, regular rhythm, normal heart sounds and intact distal pulses.   No murmur heard. Pulmonary/Chest: Effort normal and breath sounds normal. No respiratory distress. She has no wheezes.  Abdominal: Soft. Bowel sounds are normal. She exhibits no distension. There is no tenderness.  Musculoskeletal: Normal range of motion. She exhibits no edema or tenderness.  Neurological: She is alert and oriented to person, place, and time. She has normal reflexes. No cranial nerve deficit.  Skin: Skin is warm and dry.  Psychiatric: She has a normal mood and affect. Her behavior is normal. Judgment and thought content normal.  Vitals reviewed.     BP 117/70 mmHg  Pulse 82  Temp(Src) 98.4 F (36.9 C) (Oral)  Ht _0  (1.651 m)  Wt 199 lb (90.266 kg)  BMI 33.12 kg/m2  LMP 04/11/1986     Assessment & Plan:  1. Anemia, iron deficiency - CMP14+EGFR  2. HLD (hyperlipidemia) - CMP14+EGFR - Lipid panel  3. Type 2 diabetes mellitus with stage 3 chronic kidney disease, without long-term current use of insulin (HCC) - Bayer DCA Hb A1c Waived - CMP14+EGFR  4. Essential (primary) hypertension - CMP14+EGFR  5. B12 deficiency - CMP14+EGFR - Vitamin B12  6. Gastroesophageal reflux disease without esophagitis - CMP14+EGFR  7. Obesity (BMI 30-39.9) - CMP14+EGFR  8. Hypothyroidism, unspecified hypothyroidism type - CMP14+EGFR - Thyroid Panel With TSH  9. Vitamin D deficiency - CMP14+EGFR - VITAMIN D 25  Hydroxy (Vit-D Deficiency, Fractures)  10. Need for hepatitis C screening test - CMP14+EGFR - Hepatitis C Antibody  11. Osteoporosis   Continue all meds Labs pending Health Maintenance reviewed Diet and exercise encouraged RTO 3 months with her PCP  Evelina Dun, FNP

## 2015-09-30 ENCOUNTER — Other Ambulatory Visit: Payer: Self-pay | Admitting: Family Medicine

## 2015-09-30 LAB — CMP14+EGFR
ALK PHOS: 75 IU/L (ref 39–117)
ALT: 11 IU/L (ref 0–32)
AST: 10 IU/L (ref 0–40)
Albumin/Globulin Ratio: 1.8 (ref 1.2–2.2)
Albumin: 3.8 g/dL (ref 3.5–4.8)
BILIRUBIN TOTAL: 0.3 mg/dL (ref 0.0–1.2)
BUN/Creatinine Ratio: 16 (ref 12–28)
BUN: 32 mg/dL — AB (ref 8–27)
CHLORIDE: 111 mmol/L — AB (ref 96–106)
CO2: 16 mmol/L — ABNORMAL LOW (ref 18–29)
Calcium: 8.6 mg/dL — ABNORMAL LOW (ref 8.7–10.3)
Creatinine, Ser: 1.97 mg/dL — ABNORMAL HIGH (ref 0.57–1.00)
GFR calc non Af Amer: 25 mL/min/{1.73_m2} — ABNORMAL LOW (ref >59)
GFR, EST AFRICAN AMERICAN: 29 mL/min/{1.73_m2} — AB (ref >59)
GLUCOSE: 115 mg/dL — AB (ref 65–99)
Globulin, Total: 2.1 g/dL (ref 1.5–4.5)
Potassium: 4.3 mmol/L (ref 3.5–5.2)
Sodium: 144 mmol/L (ref 134–144)
TOTAL PROTEIN: 5.9 g/dL — AB (ref 6.0–8.5)

## 2015-09-30 LAB — LIPID PANEL
CHOLESTEROL TOTAL: 152 mg/dL (ref 100–199)
Chol/HDL Ratio: 3.3 ratio units (ref 0.0–4.4)
HDL: 46 mg/dL (ref >39)
LDL Calculated: 79 mg/dL (ref 0–99)
TRIGLYCERIDES: 133 mg/dL (ref 0–149)
VLDL CHOLESTEROL CAL: 27 mg/dL (ref 5–40)

## 2015-09-30 LAB — THYROID PANEL WITH TSH
FREE THYROXINE INDEX: 2.4 (ref 1.2–4.9)
T3 Uptake Ratio: 30 % (ref 24–39)
T4, Total: 8 ug/dL (ref 4.5–12.0)
TSH: 0.132 u[IU]/mL — ABNORMAL LOW (ref 0.450–4.500)

## 2015-09-30 LAB — HEPATITIS C ANTIBODY: Hep C Virus Ab: <0.1 s/co ratio (ref 0.0–0.9)

## 2015-09-30 LAB — VITAMIN B12

## 2015-09-30 LAB — VITAMIN D 25 HYDROXY (VIT D DEFICIENCY, FRACTURES): Vit D, 25-Hydroxy: 20.5 ng/mL — ABNORMAL LOW (ref 30.0–100.0)

## 2015-10-01 ENCOUNTER — Other Ambulatory Visit: Payer: Self-pay | Admitting: Family

## 2015-10-01 DIAGNOSIS — N184 Chronic kidney disease, stage 4 (severe): Secondary | ICD-10-CM

## 2015-10-01 DIAGNOSIS — E1121 Type 2 diabetes mellitus with diabetic nephropathy: Secondary | ICD-10-CM | POA: Insufficient documentation

## 2015-10-01 DIAGNOSIS — N189 Chronic kidney disease, unspecified: Secondary | ICD-10-CM | POA: Insufficient documentation

## 2015-10-01 MED ORDER — LEVOTHYROXINE SODIUM 112 MCG PO TABS: 112.0000 ug | ORAL_TABLET | Freq: Every day | ORAL | Status: DC

## 2015-10-27 ENCOUNTER — Other Ambulatory Visit: Payer: Self-pay | Admitting: Family Medicine

## 2015-10-30 ENCOUNTER — Ambulatory Visit: Payer: Medicare Other

## 2015-11-03 ENCOUNTER — Encounter: Payer: Self-pay | Admitting: Family Medicine

## 2015-11-03 ENCOUNTER — Ambulatory Visit (INDEPENDENT_AMBULATORY_CARE_PROVIDER_SITE_OTHER): Payer: Medicare Other | Admitting: Family Medicine

## 2015-11-03 VITALS — BP 128/82 | HR 86 | Temp 97.8°F | Ht 65.0 in | Wt 192.0 lb

## 2015-11-03 DIAGNOSIS — N3090 Cystitis, unspecified without hematuria: Secondary | ICD-10-CM

## 2015-11-03 LAB — MICROSCOPIC EXAMINATION
BACTERIA UA: NONE SEEN
RBC MICROSCOPIC, UA: NONE SEEN /HPF (ref 0–?)

## 2015-11-03 LAB — URINALYSIS, COMPLETE
Bilirubin, UA: NEGATIVE
GLUCOSE, UA: NEGATIVE
Ketones, UA: NEGATIVE
LEUKOCYTES UA: NEGATIVE
Nitrite, UA: NEGATIVE
PH UA: 5.5 (ref 5.0–7.5)
PROTEIN UA: NEGATIVE
RBC, UA: NEGATIVE
Specific Gravity, UA: <1.005 — ABNORMAL LOW (ref 1.005–1.030)
Urobilinogen, Ur: 0.2 mg/dL (ref 0.2–1.0)

## 2015-11-03 MED ORDER — NITROFURANTOIN MONOHYD MACRO 100 MG PO CAPS: 100.0000 mg | ORAL_CAPSULE | Freq: Two times a day (BID) | ORAL | 0 refills | Status: DC

## 2015-11-03 NOTE — Progress Notes (Signed)
Subjective:  [name], female    DOB: April 09, 1946  Age: 70 y.o. MRN: [id number]  CC: Urinary Tract Infection (dysuria, frequency started on yesterday)   HPI [name] presents for burning with urination for 1 week, and frequency for two days. Denies fever . No flank pain. No nausea, vomiting. Took a left over macrodantin tab from a bottle labelled from 2012. Took it yesterday and got partial relief.   History [name] has no past medical history on file.   She has a past surgical history that includes jejunostomy (05/17/2008); Gastric bypass (11/2001); and Vaginal hysterectomy (12/1986).   Her family history includes Cancer in her father and mother.She reports that she has never smoked. She does not have any smokeless tobacco history on file. She reports that she does not drink alcohol or use drugs.  Current Outpatient Prescriptions on File Prior to Visit  Medication Sig Dispense Refill  . atorvastatin (LIPITOR) 40 MG tablet TAKE ONE TABLET BY MOUTH DAILY 90 tablet 0  . Blood Glucose Monitoring Suppl (ONE TOUCH ULTRA SYSTEM KIT) W/DEVICE KIT 1 kit by Does not apply route once. 1 each 0  . Calcium Carbonate-Vitamin D 600-125 MG-UNIT TABS Take 2 tablets by mouth daily.    . Cholecalciferol (VITAMIN D) 2000 UNITS tablet Take 2,000 Units by mouth daily.    . cyanocobalamin (,VITAMIN B-12,) 1000 MCG/ML injection Inject 1.2 mLs (1,200 mcg total) into the muscle every 30 (thirty) days. 10 mL 5  . levothyroxine (SYNTHROID) 112 MCG tablet Take 1 tablet (112 mcg total) by mouth daily before breakfast. 90 tablet 1  . lisinopril (PRINIVIL,ZESTRIL) 20 MG tablet TAKE ONE TABLET BY MOUTH TWICE DAILY 180 tablet 1  . metFORMIN (GLUCOPHAGE-XR) 750 MG 24 hr tablet TAKE ONE TABLET BY MOUTH TWICE DAILY 180 tablet 0  . omeprazole (PRILOSEC) 40 MG capsule     . ONE TOUCH ULTRA TEST test strip USE ONE NEW STRIP TO CHECK GLUCOSE THREE TIMES DAILY 100 each 11  . predniSONE (DELTASONE) 1 MG tablet  Take 3 mg by mouth daily.    . raloxifene (EVISTA) 60 MG tablet Take 1 tablet (60 mg total) by mouth daily. 90 tablet 4   Current Facility-Administered Medications on File Prior to Visit  Medication Dose Route Frequency Provider Last Rate Last Dose  . cyanocobalamin ((VITAMIN B-12)) injection 1,000 mcg  1,000 mcg Intramuscular Q30 days Claretta Fraise, MD   1,000 mcg at 09/29/15 0850    ROS Review of Systems  Constitutional: Negative for chills, diaphoresis and fever.  HENT: Negative for congestion.   Eyes: Negative for visual disturbance.  Respiratory: Negative for cough and shortness of breath.   Cardiovascular: Negative for chest pain and palpitations.  Gastrointestinal: Negative for constipation, diarrhea and nausea.  Genitourinary: Positive for dysuria, frequency and urgency. Negative for decreased urine volume, flank pain, hematuria, menstrual problem and pelvic pain.  Musculoskeletal: Negative for arthralgias and joint swelling.  Skin: Negative for rash.  Neurological: Negative for dizziness and numbness.    Objective:  BP 128/82 (BP Location: Left Arm, Patient Position: Sitting, Cuff Size: Normal)   Pulse 86   Temp 97.8 F (36.6 C) (Oral)   Ht 5' 5"  (1.651 m)   Wt 192 lb (87.1 kg)   LMP 04/11/1986   SpO2 100%   BMI 31.95 kg/m   Physical Exam  Constitutional: She is oriented to person, place, and time. She appears well-developed and well-nourished.  HENT:  Head: Normocephalic and atraumatic.  Cardiovascular: Normal  rate and regular rhythm.   No murmur heard. Pulmonary/Chest: Effort normal and breath sounds normal.  Abdominal: Soft. Bowel sounds are normal. She exhibits no mass. There is no tenderness. There is no rebound and no guarding.  Musculoskeletal: She exhibits no tenderness.  Neurological: She is alert and oriented to person, place, and time.  Skin: Skin is warm and dry.  Psychiatric: She has a normal mood and affect. Her behavior is normal.    Assessment  & Plan:   Alisa was seen today for urinary tract infection.  Diagnoses and all orders for this visit:  Cystitis -     Urinalysis, Complete -     Urinalysis, Complete -     Urine culture  Other orders -     nitrofurantoin, macrocrystal-monohydrate, (MACROBID) 100 MG capsule; Take 1 capsule (100 mg total) by mouth 2 (two) times daily.   I am having Ms. Stidham start on nitrofurantoin (macrocrystal-monohydrate). I am also having her maintain her [place] KIT, Calcium Carbonate-Vitamin D, Vitamin D, predniSONE, raloxifene, cyanocobalamin, omeprazole, lisinopril, atorvastatin, metFORMIN, levothyroxine, and ONE TOUCH ULTRA TEST. We will continue to administer cyanocobalamin.  Meds ordered this encounter  Medications  . nitrofurantoin, macrocrystal-monohydrate, (MACROBID) 100 MG capsule    Sig: Take 1 capsule (100 mg total) by mouth 2 (two) times daily.    Dispense:  14 capsule    Refill:  0     Follow-up: Return if symptoms worsen or fail to improve.  Claretta Fraise, M.D.

## 2015-11-09 LAB — URINE CULTURE

## 2015-11-27 ENCOUNTER — Other Ambulatory Visit: Payer: Self-pay | Admitting: Family Medicine

## 2015-12-02 ENCOUNTER — Ambulatory Visit (INDEPENDENT_AMBULATORY_CARE_PROVIDER_SITE_OTHER): Payer: Medicare Other | Admitting: *Deleted

## 2015-12-02 ENCOUNTER — Encounter: Payer: Medicare Other | Admitting: *Deleted

## 2015-12-02 DIAGNOSIS — E538 Deficiency of other specified B group vitamins: Secondary | ICD-10-CM

## 2015-12-02 NOTE — Progress Notes (Signed)
Pt given B12 injection IM left deltoid and tolerated well. °

## 2015-12-30 ENCOUNTER — Other Ambulatory Visit: Payer: Self-pay | Admitting: Family Medicine

## 2016-01-04 ENCOUNTER — Ambulatory Visit (INDEPENDENT_AMBULATORY_CARE_PROVIDER_SITE_OTHER): Payer: Medicare Other | Admitting: *Deleted

## 2016-01-04 DIAGNOSIS — E538 Deficiency of other specified B group vitamins: Secondary | ICD-10-CM

## 2016-01-04 DIAGNOSIS — Z23 Encounter for immunization: Secondary | ICD-10-CM | POA: Diagnosis not present

## 2016-01-04 NOTE — Progress Notes (Signed)
Vit B12 given Tolerated well

## 2016-01-25 ENCOUNTER — Other Ambulatory Visit: Payer: Self-pay | Admitting: Family

## 2016-01-28 ENCOUNTER — Ambulatory Visit (INDEPENDENT_AMBULATORY_CARE_PROVIDER_SITE_OTHER): Payer: Medicare Other | Admitting: *Deleted

## 2016-01-28 DIAGNOSIS — E538 Deficiency of other specified B group vitamins: Secondary | ICD-10-CM | POA: Diagnosis not present

## 2016-01-28 MED ORDER — CYANOCOBALAMIN 1000 MCG/ML IJ SOLN
200.0000 ug | INTRAMUSCULAR | Status: AC
  Administered 2016-01-28 – 2017-03-09 (×10): 200 ug via INTRAMUSCULAR

## 2016-01-28 MED ORDER — CYANOCOBALAMIN 1000 MCG/ML IJ SOLN
1000.0000 ug | INTRAMUSCULAR | Status: AC
  Administered 2016-01-28 – 2017-03-09 (×12): 1000 ug via INTRAMUSCULAR

## 2016-01-28 NOTE — Progress Notes (Signed)
Patient receives from our supply and provides from the pharmacy to equal a dose of q 30 days. I changed to order to reflect this. Both lot numbers should be documented but patient should only be charged one injection fee. Mark the as patient supplied so she isn't charge for that medication.   Patient tolerated well.

## 2016-03-08 ENCOUNTER — Ambulatory Visit (INDEPENDENT_AMBULATORY_CARE_PROVIDER_SITE_OTHER): Payer: Medicare Other | Admitting: *Deleted

## 2016-03-08 DIAGNOSIS — E538 Deficiency of other specified B group vitamins: Secondary | ICD-10-CM | POA: Diagnosis not present

## 2016-03-08 NOTE — Progress Notes (Signed)
Pt given Vit B12 inj Tolerated well 

## 2016-03-31 ENCOUNTER — Other Ambulatory Visit: Payer: Self-pay | Admitting: Family Medicine

## 2016-03-31 ENCOUNTER — Other Ambulatory Visit: Payer: Self-pay | Admitting: Family

## 2016-04-22 ENCOUNTER — Ambulatory Visit (INDEPENDENT_AMBULATORY_CARE_PROVIDER_SITE_OTHER): Payer: Medicare Other | Admitting: *Deleted

## 2016-04-22 DIAGNOSIS — E538 Deficiency of other specified B group vitamins: Secondary | ICD-10-CM

## 2016-04-22 NOTE — Progress Notes (Signed)
Pt given vit B12 inj Tolerated well 

## 2016-05-02 ENCOUNTER — Other Ambulatory Visit: Payer: Self-pay | Admitting: Family Medicine

## 2016-06-07 ENCOUNTER — Ambulatory Visit (INDEPENDENT_AMBULATORY_CARE_PROVIDER_SITE_OTHER): Payer: Medicare Other | Admitting: *Deleted

## 2016-06-07 DIAGNOSIS — E538 Deficiency of other specified B group vitamins: Secondary | ICD-10-CM | POA: Diagnosis not present

## 2016-06-07 NOTE — Progress Notes (Signed)
Pt given Vit B12 inj Tolerated well 

## 2016-07-03 ENCOUNTER — Other Ambulatory Visit: Payer: Self-pay | Admitting: Family Medicine

## 2016-07-13 ENCOUNTER — Ambulatory Visit (INDEPENDENT_AMBULATORY_CARE_PROVIDER_SITE_OTHER): Payer: Medicare Other | Admitting: Family Medicine

## 2016-07-13 ENCOUNTER — Encounter: Payer: Self-pay | Admitting: Family Medicine

## 2016-07-13 VITALS — BP 115/70 | HR 80 | Temp 97.6°F | Ht 65.0 in | Wt 181.0 lb

## 2016-07-13 DIAGNOSIS — E782 Mixed hyperlipidemia: Secondary | ICD-10-CM | POA: Diagnosis not present

## 2016-07-13 DIAGNOSIS — E538 Deficiency of other specified B group vitamins: Secondary | ICD-10-CM

## 2016-07-13 DIAGNOSIS — N183 Chronic kidney disease, stage 3 (moderate): Secondary | ICD-10-CM | POA: Diagnosis not present

## 2016-07-13 DIAGNOSIS — E039 Hypothyroidism, unspecified: Secondary | ICD-10-CM

## 2016-07-13 DIAGNOSIS — D509 Iron deficiency anemia, unspecified: Secondary | ICD-10-CM | POA: Diagnosis not present

## 2016-07-13 DIAGNOSIS — I10 Essential (primary) hypertension: Secondary | ICD-10-CM | POA: Diagnosis not present

## 2016-07-13 DIAGNOSIS — E1121 Type 2 diabetes mellitus with diabetic nephropathy: Secondary | ICD-10-CM | POA: Diagnosis not present

## 2016-07-13 DIAGNOSIS — E1122 Type 2 diabetes mellitus with diabetic chronic kidney disease: Secondary | ICD-10-CM

## 2016-07-13 LAB — BAYER DCA HB A1C WAIVED: HB A1C (BAYER DCA - WAIVED): 6.1 % (ref <7.0)

## 2016-07-13 MED ORDER — CYANOCOBALAMIN 1000 MCG/ML IJ KIT
1200.0000 ug | PACK | INTRAMUSCULAR | 6 refills | Status: DC
Start: 2016-07-13 — End: 2018-02-21

## 2016-07-13 MED ORDER — OMEPRAZOLE 40 MG PO CPDR: 40.0000 mg | DELAYED_RELEASE_CAPSULE | Freq: Every day | ORAL | 1 refills | Status: DC

## 2016-07-13 MED ORDER — ATORVASTATIN CALCIUM 40 MG PO TABS: 40.0000 mg | ORAL_TABLET | Freq: Every day | ORAL | 1 refills | Status: DC

## 2016-07-13 NOTE — Progress Notes (Signed)
Subjective:  [name], female    DOB: 12-30-1945  Age: 71 y.o. MRN: [id number]  CC: Diabetes (pt here today for routine follow up on her diabetes. No concerns voiced at this time.)   HPI Erin Ortiz presents forFollow-up of diabetes. Patient checks blood sugar at home.   115  fasting and 90-103 postprandial Patient denies symptoms such as polyuria, polydipsia, excessive hunger, nausea No significant hypoglycemic spells noted. Medications reviewed. Pt reports taking them regularly without complication/adverse reaction being reported today.  Checking feet daily. Last eye appt was UTD  Pt. Taking prednisone 3 mg daily during cold weather. Alternates between 3&2 when weather is warm.Pain currently tolerable on 3 mg  Dose. Mobility limited, but able to perform most tasks.  History Erin Ortiz has no past medical history on file.   She has a past surgical history that includes jejunostomy (05/17/2008); Gastric bypass (11/2001); and Vaginal hysterectomy (12/1986).   Her family history includes Cancer in her father and mother.She reports that she has never smoked. She has never used smokeless tobacco. She reports that she does not drink alcohol or use drugs.  Current Outpatient Prescriptions on File Prior to Visit  Medication Sig Dispense Refill  . Blood Glucose Monitoring Suppl (ONE TOUCH ULTRA SYSTEM KIT) W/DEVICE KIT 1 kit by Does not apply route once. 1 each 0  . Calcium Carbonate-Vitamin D 600-125 MG-UNIT TABS Take 2 tablets by mouth daily.    . Cholecalciferol (VITAMIN D) 2000 UNITS tablet Take 2,000 Units by mouth daily.    Marland Kitchen levothyroxine (SYNTHROID, LEVOTHROID) 112 MCG tablet TAKE ONE TABLET BY MOUTH DAILY BEFORE BREAKFAST 90 tablet 1  . lisinopril (PRINIVIL,ZESTRIL) 20 MG tablet TAKE ONE TABLET BY MOUTH TWICE DAILY 180 tablet 0  . metFORMIN (GLUCOPHAGE-XR) 750 MG 24 hr tablet TAKE ONE TABLET BY MOUTH TWICE DAILY 180 tablet 0  . ONE TOUCH ULTRA TEST test strip USE ONE  NEW STRIP TO CHECK GLUCOSE THREE TIMES DAILY 100 each 11  . predniSONE (DELTASONE) 1 MG tablet Take 3 mg by mouth daily.    . raloxifene (EVISTA) 60 MG tablet TAKE ONE TABLET BY MOUTH DAILY 90 tablet 4   Current Facility-Administered Medications on File Prior to Visit  Medication Dose Route Frequency Provider Last Rate Last Dose  . cyanocobalamin ((VITAMIN B-12)) injection 1,000 mcg  1,000 mcg Intramuscular Q30 days Claretta Fraise, MD   1,000 mcg at 07/13/16 0849  . cyanocobalamin ((VITAMIN B-12)) injection 200 mcg  200 mcg Intramuscular Q30 days Claretta Fraise, MD   200 mcg at 06/07/16 1034    ROS Review of Systems  Constitutional: Negative for activity change, appetite change and fever.  HENT: Negative for congestion, rhinorrhea and sore throat.   Eyes: Negative for visual disturbance.  Respiratory: Negative for cough and shortness of breath.   Cardiovascular: Negative for chest pain and palpitations.  Gastrointestinal: Negative for abdominal pain, diarrhea and nausea.  Genitourinary: Negative for dysuria.  Musculoskeletal: Positive for arthralgias. Negative for myalgias.    Objective:  BP 115/70   Pulse 80   Temp 97.6 F (36.4 C) (Oral)   Ht _0  (1.651 m)   Wt 181 lb (82.1 kg)   LMP 04/11/1986   BMI 30.12 kg/m   BP Readings from Last 3 Encounters:  07/13/16 115/70  11/03/15 128/82  09/29/15 117/70    Wt Readings from Last 3 Encounters:  07/13/16 181 lb (82.1 kg)  11/03/15 192 lb (87.1 kg)  09/29/15 199 lb (90.3 kg)  Physical Exam  Constitutional: She is oriented to person, place, and time. She appears well-developed and well-nourished. No distress.  HENT:  Head: Normocephalic and atraumatic.  Right Ear: External ear normal.  Left Ear: External ear normal.  Nose: Nose normal.  Mouth/Throat: Oropharynx is clear and moist.  Eyes: Conjunctivae and EOM are normal. Pupils are equal, round, and reactive to light.  Neck: Normal range of motion. Neck supple. No  thyromegaly present.  Cardiovascular: Normal rate, regular rhythm and normal heart sounds.   No murmur heard. Pulmonary/Chest: Effort normal and breath sounds normal. No respiratory distress. She has no wheezes. She has no rales.  Abdominal: Soft. Bowel sounds are normal. She exhibits no distension. There is no tenderness.  Lymphadenopathy:    She has no cervical adenopathy.  Neurological: She is alert and oriented to person, place, and time. She has normal reflexes.  Skin: Skin is warm and dry.  Psychiatric: She has a normal mood and affect. Her behavior is normal. Judgment and thought content normal.    No components found for: BAYER     Assessment & Plan:   Erin Ortiz was seen today for diabetes.  Diagnoses and all orders for this visit:  Type 2 diabetes mellitus with stage 3 chronic kidney disease, without long-term current use of insulin (HCC) -     CBC with Differential/Platelet -     Cdiff NAA+O+P+Stool Culture -     Microalbumin / creatinine urine ratio -     Lipid panel -     Bayer DCA Hb A1c Waived -     TSH + free T4  Essential (primary) hypertension  Hypothyroidism, unspecified type -     TSH + free T4  Iron deficiency anemia, unspecified iron deficiency anemia type  Diabetic nephropathy associated with type 2 diabetes mellitus (HCC)  Mixed hyperlipidemia  Other orders -     atorvastatin (LIPITOR) 40 MG tablet; Take 1 tablet (40 mg total) by mouth daily. -     omeprazole (PRILOSEC) 40 MG capsule; Take 1 capsule (40 mg total) by mouth daily. -     Cyanocobalamin 1000 MCG/ML KIT; Inject 1,200 mcg as directed every 30 (thirty) days.      I have discontinued Erin Ortiz's omeprazole and nitrofurantoin (macrocrystal-monohydrate). I have also changed her atorvastatin and omeprazole. Additionally, I am having her start on Cyanocobalamin. Lastly, I am having her maintain her ONE TOUCH ULTRA SYSTEM KIT, Calcium Carbonate-Vitamin D, Vitamin D, predniSONE, ONE TOUCH  ULTRA TEST, raloxifene, levothyroxine, metFORMIN, and lisinopril. We administered cyanocobalamin. We will continue to administer cyanocobalamin and cyanocobalamin.  Meds ordered this encounter  Medications  . atorvastatin (LIPITOR) 40 MG tablet    Sig: Take 1 tablet (40 mg total) by mouth daily.    Dispense:  90 tablet    Refill:  1  . omeprazole (PRILOSEC) 40 MG capsule    Sig: Take 1 capsule (40 mg total) by mouth daily.    Dispense:  90 capsule    Refill:  1  . Cyanocobalamin 1000 MCG/ML KIT    Sig: Inject 1,200 mcg as directed every 30 (thirty) days.    Dispense:  1 kit    Refill:  6     Follow-up: Return in about 3 months (around 10/12/2016).  Claretta Fraise, M.D.

## 2016-07-14 ENCOUNTER — Other Ambulatory Visit: Payer: Self-pay | Admitting: Family Medicine

## 2016-07-14 LAB — MICROALBUMIN / CREATININE URINE RATIO
CREATININE, UR: 32.1 mg/dL
Microalb/Creat Ratio: <9.3 mg/g creat (ref 0.0–30.0)
Microalbumin, Urine: <3 ug/mL

## 2016-07-14 LAB — CBC WITH DIFFERENTIAL/PLATELET
BASOS ABS: 0 10*3/uL (ref 0.0–0.2)
BASOS: 1 %
EOS (ABSOLUTE): 0.1 10*3/uL (ref 0.0–0.4)
Eos: 2 %
Hematocrit: 30.7 % — ABNORMAL LOW (ref 34.0–46.6)
Hemoglobin: 9.9 g/dL — ABNORMAL LOW (ref 11.1–15.9)
Immature Grans (Abs): 0 10*3/uL (ref 0.0–0.1)
Immature Granulocytes: 0 %
LYMPHS ABS: 1.5 10*3/uL (ref 0.7–3.1)
LYMPHS: 19 %
MCH: 28.1 pg (ref 26.6–33.0)
MCHC: 32.2 g/dL (ref 31.5–35.7)
MCV: 87 fL (ref 79–97)
MONOS ABS: 0.1 10*3/uL (ref 0.1–0.9)
Monocytes: 1 %
NEUTROS ABS: 6 10*3/uL (ref 1.4–7.0)
Neutrophils: 77 %
PLATELETS: 241 10*3/uL (ref 150–379)
RBC: 3.52 x10E6/uL — ABNORMAL LOW (ref 3.77–5.28)
RDW: 14.5 % (ref 12.3–15.4)
WBC: 7.7 10*3/uL (ref 3.4–10.8)

## 2016-07-14 LAB — LIPID PANEL
CHOL/HDL RATIO: 2.8 ratio (ref 0.0–4.4)
CHOLESTEROL TOTAL: 161 mg/dL (ref 100–199)
HDL: 57 mg/dL (ref >39)
LDL CALC: 86 mg/dL (ref 0–99)
Triglycerides: 90 mg/dL (ref 0–149)
VLDL CHOLESTEROL CAL: 18 mg/dL (ref 5–40)

## 2016-07-14 LAB — TSH+FREE T4
FREE T4: 1.32 ng/dL (ref 0.82–1.77)
TSH: 0.765 u[IU]/mL (ref 0.450–4.500)

## 2016-07-14 NOTE — Telephone Encounter (Signed)
Spoke to [place] at the pharmacy and verified rx.

## 2016-07-22 LAB — HM DIABETES EYE EXAM

## 2016-08-18 ENCOUNTER — Ambulatory Visit (INDEPENDENT_AMBULATORY_CARE_PROVIDER_SITE_OTHER): Payer: Medicare Other | Admitting: *Deleted

## 2016-08-18 DIAGNOSIS — E538 Deficiency of other specified B group vitamins: Secondary | ICD-10-CM | POA: Diagnosis not present

## 2016-08-18 NOTE — Progress Notes (Signed)
Pt given vit B12 inj Tolerated well 

## 2016-09-27 ENCOUNTER — Ambulatory Visit (INDEPENDENT_AMBULATORY_CARE_PROVIDER_SITE_OTHER): Payer: Medicare Other | Admitting: *Deleted

## 2016-09-27 DIAGNOSIS — E538 Deficiency of other specified B group vitamins: Secondary | ICD-10-CM | POA: Diagnosis not present

## 2016-09-27 MED ORDER — LISINOPRIL 20 MG PO TABS: 20.0000 mg | ORAL_TABLET | Freq: Two times a day (BID) | ORAL | 0 refills | Status: DC

## 2016-09-27 MED ORDER — LEVOTHYROXINE SODIUM 112 MCG PO TABS: ORAL_TABLET | ORAL | 3 refills | Status: DC

## 2016-10-05 ENCOUNTER — Encounter: Payer: Self-pay | Admitting: Family Medicine

## 2016-10-05 ENCOUNTER — Ambulatory Visit (INDEPENDENT_AMBULATORY_CARE_PROVIDER_SITE_OTHER): Payer: Medicare Other | Admitting: Family Medicine

## 2016-10-05 VITALS — BP 109/70 | HR 65 | Temp 97.6°F | Ht 65.0 in | Wt 177.4 lb

## 2016-10-05 DIAGNOSIS — N183 Chronic kidney disease, stage 3 unspecified: Secondary | ICD-10-CM

## 2016-10-05 DIAGNOSIS — E1122 Type 2 diabetes mellitus with diabetic chronic kidney disease: Secondary | ICD-10-CM

## 2016-10-05 NOTE — Progress Notes (Signed)
Subjective:  [name], female    DOB: 02/01/1946  Age: 71 y.o. MRN: [id number]  CC: Advice Only (pt here today to discuss recent visit with nephrologist )   HPI [name] presents for recheck of renal insufficiency. Seen recently by nephrology. M.D. Suggested that she be in a study for farxiga. She is considering. She brought the study prospectus with her for my opinion. She wants me to review before she decides.    History [name] has a past medical history of Anemia; Chronic kidney disease; and Diabetes mellitus without complication ([place]).   She has a past surgical history that includes jejunostomy (05/17/2008); Gastric bypass (11/2001); and Vaginal hysterectomy (12/1986).   Her family history includes Cancer in her father and mother.She reports that she has never smoked. She has never used smokeless tobacco. She reports that she does not drink alcohol or use drugs.    ROS Review of Systems  Constitutional: Negative for fever.  HENT: Negative for congestion, rhinorrhea and sore throat.   Respiratory: Negative for cough and shortness of breath.   Cardiovascular: Negative for chest pain and palpitations.  Gastrointestinal: Negative for abdominal pain.  Musculoskeletal: Negative for arthralgias and myalgias.    Objective:  BP 109/70   Pulse 65   Temp 97.6 F (36.4 C) (Oral)   Ht 5' 5" (1.651 m)   Wt 177 lb 6 oz (80.5 kg)   LMP 04/11/1986   BMI 29.52 kg/m   BP Readings from Last 3 Encounters:  10/05/16 109/70  07/13/16 115/70  11/03/15 128/82    Wt Readings from Last 3 Encounters:  10/05/16 177 lb 6 oz (80.5 kg)  07/13/16 181 lb (82.1 kg)  11/03/15 192 lb (87.1 kg)     Physical Exam  Constitutional: She is oriented to person, place, and time. She appears well-developed and well-nourished. No distress.  Cardiovascular: Normal rate and regular rhythm.   Pulmonary/Chest: Breath sounds normal.  Neurological: She is alert and oriented to person,  place, and time.  Skin: Skin is warm and dry.  Psychiatric: She has a normal mood and affect.      Assessment & Plan:   [place] was seen today for advice only.  Diagnoses and all orders for this visit:  Type 2 diabetes mellitus with stage 3 chronic kidney disease, without long-term current use of insulin ([place])       I have discontinued Ms. Novitsky's Calcium Carbonate-Vitamin D and Vitamin D. I am also having her maintain her [place] KIT, predniSONE, ONE TOUCH ULTRA TEST, raloxifene, metFORMIN, atorvastatin, omeprazole, Cyanocobalamin, lisinopril, levothyroxine, and Vitamin D (Ergocalciferol). We will continue to administer cyanocobalamin and cyanocobalamin.  Allergies as of 10/05/2016   No Known Allergies     Medication List       Accurate as of 10/05/16 11:59 PM. Always use your most recent med list.          atorvastatin 40 MG tablet Commonly known as:  LIPITOR Take 1 tablet (40 mg total) by mouth daily.   Cyanocobalamin 1000 MCG/ML Kit Inject 1,200 mcg as directed every 30 (thirty) days.   levothyroxine 112 MCG tablet Commonly known as:  SYNTHROID, LEVOTHROID TAKE ONE TABLET BY MOUTH DAILY BEFORE BREAKFAST   lisinopril 20 MG tablet Commonly known as:  PRINIVIL,ZESTRIL Take 1 tablet (20 mg total) by mouth 2 (two) times daily.   metFORMIN 750 MG 24 hr tablet Commonly known as:  GLUCOPHAGE-XR TAKE ONE TABLET BY MOUTH TWICE DAILY  omeprazole 40 MG capsule Commonly known as:  PRILOSEC Take 1 capsule (40 mg total) by mouth daily.   ONE TOUCH ULTRA SYSTEM KIT w/Device Kit 1 kit by Does not apply route once.   ONE TOUCH ULTRA TEST test strip Generic drug:  glucose blood USE ONE NEW STRIP TO CHECK GLUCOSE THREE TIMES DAILY   predniSONE 1 MG tablet Commonly known as:  DELTASONE Take 3 mg by mouth daily.   raloxifene 60 MG tablet Commonly known as:  EVISTA TAKE ONE TABLET BY MOUTH DAILY   Vitamin D (Ergocalciferol) 50000 units Caps  capsule Commonly known as:  DRISDOL Take 50,000 Units by mouth every 7 (seven) days.      25 minutes were spent in evaluation patient. More than half of which was spent in discussion and planning of her participation in the procedure study for preservation of renal function..  Follow-up: Return in about 2 weeks (around 10/19/2016).  Claretta Fraise, M.D.

## 2016-10-09 ENCOUNTER — Encounter: Payer: Self-pay | Admitting: Family Medicine

## 2016-10-14 ENCOUNTER — Ambulatory Visit (INDEPENDENT_AMBULATORY_CARE_PROVIDER_SITE_OTHER): Payer: Medicare Other | Admitting: Family Medicine

## 2016-10-14 ENCOUNTER — Encounter: Payer: Self-pay | Admitting: Family Medicine

## 2016-10-14 VITALS — BP 107/68 | HR 69 | Temp 97.7°F | Ht 65.0 in | Wt 174.0 lb

## 2016-10-14 DIAGNOSIS — E559 Vitamin D deficiency, unspecified: Secondary | ICD-10-CM

## 2016-10-14 DIAGNOSIS — E039 Hypothyroidism, unspecified: Secondary | ICD-10-CM | POA: Diagnosis not present

## 2016-10-14 DIAGNOSIS — L820 Inflamed seborrheic keratosis: Secondary | ICD-10-CM | POA: Diagnosis not present

## 2016-10-14 DIAGNOSIS — E538 Deficiency of other specified B group vitamins: Secondary | ICD-10-CM | POA: Diagnosis not present

## 2016-10-14 DIAGNOSIS — L570 Actinic keratosis: Secondary | ICD-10-CM | POA: Diagnosis not present

## 2016-10-14 DIAGNOSIS — D509 Iron deficiency anemia, unspecified: Secondary | ICD-10-CM | POA: Diagnosis not present

## 2016-10-14 DIAGNOSIS — E1121 Type 2 diabetes mellitus with diabetic nephropathy: Secondary | ICD-10-CM

## 2016-10-14 DIAGNOSIS — K219 Gastro-esophageal reflux disease without esophagitis: Secondary | ICD-10-CM | POA: Diagnosis not present

## 2016-10-14 LAB — BAYER DCA HB A1C WAIVED: HB A1C (BAYER DCA - WAIVED): 5.8 % (ref <7.0)

## 2016-10-14 MED ORDER — TRIAMCINOLONE ACETONIDE 0.1 % EX CREA: 1.0000 "application " | TOPICAL_CREAM | Freq: Three times a day (TID) | CUTANEOUS | 0 refills | Status: DC

## 2016-10-14 NOTE — Progress Notes (Addendum)
Subjective:  [name],  female    DOB: [birth year]  Age: 71 y.o.    CC: Diabetes (pt here today for routine follow up of her chronic medical conditions)   HPI Divinity Kyler presents for  follow-up of hypertension. Patient has no history of headache chest pain or shortness of breath or recent cough. Patient also denies symptoms of TIA such as numbness weakness lateralizing. Patient checks  blood pressure at home. Recent readings have been good Patient denies side effects from medication. States taking it regularly.  Patient also  in for follow-up of elevated cholesterol. Doing well without complaints on current medication. Denies side effects of statin including myalgia and arthralgia and nausea. Also in today for liver function testing. Currently no chest pain, shortness of breath or other cardiovascular related symptoms noted.  Follow-up of diabetes. Patient does check blood sugar at home. Readings run between 76 and 119 Patient denies symptoms such as polyuria, polydipsia, excessive hunger, nausea No significant hypoglycemic spells noted. Medications reviewed. Pt reports taking them regularly. Pt. denies complication/adverse reaction today.    History Trudy has a past medical history of Anemia; Chronic kidney disease; and Diabetes mellitus without complication ([place]).   She has a past surgical history that includes jejunostomy (05/17/2008); Gastric bypass (11/2001); and Vaginal hysterectomy (12/1986).   Her family history includes Cancer in her father and mother.She reports that she has never smoked. She has never used smokeless tobacco. She reports that she does not drink alcohol or use drugs.  Current Outpatient Prescriptions on File Prior to Visit  Medication Sig Dispense Refill  . atorvastatin (LIPITOR) 40 MG tablet Take 1 tablet (40 mg total) by mouth daily. 90 tablet 1  . Blood Glucose Monitoring Suppl (ONE TOUCH ULTRA SYSTEM KIT) W/DEVICE KIT 1 kit by Does not  apply route once. 1 each 0  . Cyanocobalamin 1000 MCG/ML KIT Inject 1,200 mcg as directed every 30 (thirty) days. 1 kit 6  . levothyroxine (SYNTHROID, LEVOTHROID) 112 MCG tablet TAKE ONE TABLET BY MOUTH DAILY BEFORE BREAKFAST 90 tablet 3  . lisinopril (PRINIVIL,ZESTRIL) 20 MG tablet Take 1 tablet (20 mg total) by mouth 2 (two) times daily. 180 tablet 0  . metFORMIN (GLUCOPHAGE-XR) 750 MG 24 hr tablet TAKE ONE TABLET BY MOUTH TWICE DAILY 180 tablet 0  . omeprazole (PRILOSEC) 40 MG capsule Take 1 capsule (40 mg total) by mouth daily. 90 capsule 1  . ONE TOUCH ULTRA TEST test strip USE ONE NEW STRIP TO CHECK GLUCOSE THREE TIMES DAILY 100 each 11  . predniSONE (DELTASONE) 1 MG tablet Take 3 mg by mouth daily.    . raloxifene (EVISTA) 60 MG tablet TAKE ONE TABLET BY MOUTH DAILY 90 tablet 4  . Vitamin D, Ergocalciferol, (DRISDOL) 50000 units CAPS capsule Take 50,000 Units by mouth every 7 (seven) days.     Current Facility-Administered Medications on File Prior to Visit  Medication Dose Route Frequency Provider Last Rate Last Dose  . cyanocobalamin ((VITAMIN B-12)) injection 1,000 mcg  1,000 mcg Intramuscular Q30 days Claretta Fraise, MD   1,000 mcg at 09/27/16 0949  . cyanocobalamin ((VITAMIN B-12)) injection 200 mcg  200 mcg Intramuscular Q30 days Claretta Fraise, MD   200 mcg at 08/18/16 0906    ROS Review of Systems  Constitutional: Negative for activity change, appetite change and fever.  HENT: Negative for congestion, rhinorrhea and sore throat.   Eyes: Negative for visual disturbance.  Respiratory: Negative for cough and shortness of breath.  Cardiovascular: Negative for chest pain and palpitations.  Gastrointestinal: Negative for abdominal pain, diarrhea and nausea.  Genitourinary: Negative for dysuria.  Musculoskeletal: Negative for arthralgias and myalgias.    Objective:  BP 107/68   Pulse 69   Temp 97.7 F (36.5 C) (Oral)   Ht 5' 5"  (1.651 m)   Wt 174 lb (78.9 kg)   LMP  04/11/1986   BMI 28.96 kg/m   BP Readings from Last 3 Encounters:  10/14/16 107/68  10/05/16 109/70  07/13/16 115/70    Wt Readings from Last 3 Encounters:  10/14/16 174 lb (78.9 kg)  10/05/16 177 lb 6 oz (80.5 kg)  07/13/16 181 lb (82.1 kg)     Physical Exam  Constitutional: She is oriented to person, place, and time. She appears well-developed and well-nourished. No distress.  HENT:  Head: Normocephalic and atraumatic.  Right Ear: External ear normal.  Left Ear: External ear normal.  Nose: Nose normal.  Mouth/Throat: Oropharynx is clear and moist.  Eyes: Conjunctivae and EOM are normal. Pupils are equal, round, and reactive to light.  Neck: Normal range of motion. Neck supple. No thyromegaly present.  Cardiovascular: Normal rate, regular rhythm and normal heart sounds.   No murmur heard. Pulmonary/Chest: Effort normal and breath sounds normal. No respiratory distress. She has no wheezes. She has no rales.  Abdominal: Soft. Bowel sounds are normal. She exhibits no distension. There is no tenderness.  Lymphadenopathy:    She has no cervical adenopathy.  Neurological: She is alert and oriented to person, place, and time. She has normal reflexes.  Skin: Skin is warm and dry.  3 lesions noted on the arms 2 with actinic scaling and erythema on the left posterior arm near the elbow measuring 6 mm and 3 mm. A second measuring 4 x 8 mm at the right upper arm. This is a recurrent seborrheic keratosis that has been previously frozen.  A fourth lesion is noted on the right forearm. This is scaly but patient relates it was from a wound about a month ago.  Psychiatric: She has a normal mood and affect. Her behavior is normal. Judgment and thought content normal.    Diabetic Foot Exam - Simple   Simple Foot Form Diabetic Foot exam was performed with the following findings:  Yes 10/14/2016  9:29 AM  Visual Inspection No deformities, no ulcerations, no other skin breakdown bilaterally:   Yes Sensation Testing Intact to touch and monofilament testing bilaterally:  Yes Pulse Check Posterior Tibialis and Dorsalis pulse intact bilaterally:  Yes Comments       Assessment & Plan:   Garrett was seen today for diabetes.  Diagnoses and all orders for this visit:  Diabetic nephropathy associated with type 2 diabetes mellitus ([place]) -     Bayer [place] Hb A1c Waived; Future -     CMP14+EGFR; Future -     Microalbumin / creatinine urine ratio; Future -     Urinalysis; Future -     Lipid panel; Future  Iron deficiency anemia, unspecified iron deficiency anemia type  B12 deficiency -     Vitamin B12  Avitaminosis D -     VITAMIN D 25 Hydroxy (Vit-D Deficiency, Fractures)  Gastroesophageal reflux disease without esophagitis  Hypothyroidism, unspecified type  Actinic keratosis  Seborrheic keratosis, inflamed  Other orders -     triamcinolone cream (KENALOG) 0.1 %; Apply 1 application topically 3 (three) times daily. Avoid face and genitalia   I am having Ms. Bundren start on  triamcinolone cream. I am also having her maintain her ONE TOUCH ULTRA SYSTEM KIT, predniSONE, ONE TOUCH ULTRA TEST, raloxifene, metFORMIN, atorvastatin, omeprazole, Cyanocobalamin, lisinopril, levothyroxine, and Vitamin D (Ergocalciferol). We will continue to administer cyanocobalamin and cyanocobalamin.  Meds ordered this encounter  Medications  . triamcinolone cream (KENALOG) 0.1 %    Sig: Apply 1 application topically 3 (three) times daily. Avoid face and genitalia    Dispense:  45 g    Refill:  0   Cryotherapy performed on the above three lesions - Right upper arm & left arm at elbow.40 seconds each using liquid nitrogen   Follow-up: Return in about 3 months (around 01/14/2017).  Claretta Fraise, M.D.

## 2016-10-14 NOTE — Addendum Note (Signed)
Addended by: Margorie John on: 10/14/2016 09:51 AM   Modules accepted: Orders

## 2016-10-14 NOTE — Addendum Note (Signed)
Addended by: Margorie John on: 10/14/2016 09:44 AM   Modules accepted: Orders

## 2016-10-14 NOTE — Progress Notes (Signed)
Done. Thanks, WS 

## 2016-10-15 LAB — LIPID PANEL
Chol/HDL Ratio: 2.5 ratio (ref 0.0–4.4)
Cholesterol, Total: 134 mg/dL (ref 100–199)
HDL: 53 mg/dL (ref >39)
LDL Calculated: 62 mg/dL (ref 0–99)
Triglycerides: 93 mg/dL (ref 0–149)
VLDL Cholesterol Cal: 19 mg/dL (ref 5–40)

## 2016-10-15 LAB — CMP14+EGFR
ALT: 18 IU/L (ref 0–32)
AST: 17 IU/L (ref 0–40)
Albumin/Globulin Ratio: 1.8 (ref 1.2–2.2)
Albumin: 4 g/dL (ref 3.5–4.8)
Alkaline Phosphatase: 72 IU/L (ref 39–117)
BUN/Creatinine Ratio: 14 (ref 12–28)
BUN: 23 mg/dL (ref 8–27)
Bilirubin Total: 0.3 mg/dL (ref 0.0–1.2)
CO2: 23 mmol/L (ref 20–29)
Calcium: 8.3 mg/dL — ABNORMAL LOW (ref 8.7–10.3)
Chloride: 108 mmol/L — ABNORMAL HIGH (ref 96–106)
Creatinine, Ser: 1.62 mg/dL — ABNORMAL HIGH (ref 0.57–1.00)
GFR calc Af Amer: 37 mL/min/{1.73_m2} — ABNORMAL LOW (ref >59)
GFR calc non Af Amer: 32 mL/min/{1.73_m2} — ABNORMAL LOW (ref >59)
Globulin, Total: 2.2 g/dL (ref 1.5–4.5)
Glucose: 102 mg/dL — ABNORMAL HIGH (ref 65–99)
Potassium: 4.9 mmol/L (ref 3.5–5.2)
Sodium: 142 mmol/L (ref 134–144)
Total Protein: 6.2 g/dL (ref 6.0–8.5)

## 2016-10-15 LAB — URINALYSIS
Bilirubin, UA: NEGATIVE
Glucose, UA: NEGATIVE
Ketones, UA: NEGATIVE
NITRITE UA: NEGATIVE
PH UA: 6 (ref 5.0–7.5)
Protein, UA: NEGATIVE
RBC, UA: NEGATIVE
Specific Gravity, UA: 1.009 (ref 1.005–1.030)
Urobilinogen, Ur: 0.2 mg/dL (ref 0.2–1.0)

## 2016-10-15 LAB — MICROALBUMIN / CREATININE URINE RATIO
Creatinine, Urine: 50.1 mg/dL
Microalbumin, Urine: <3 ug/mL

## 2016-10-15 LAB — VITAMIN D 25 HYDROXY (VIT D DEFICIENCY, FRACTURES): VIT D 25 HYDROXY: 48.9 ng/mL (ref 30.0–100.0)

## 2016-10-15 LAB — VITAMIN B12: Vitamin B-12: 1310 pg/mL — ABNORMAL HIGH (ref 232–1245)

## 2016-10-26 ENCOUNTER — Ambulatory Visit (INDEPENDENT_AMBULATORY_CARE_PROVIDER_SITE_OTHER): Payer: Medicare Other | Admitting: *Deleted

## 2016-10-26 DIAGNOSIS — E538 Deficiency of other specified B group vitamins: Secondary | ICD-10-CM

## 2016-10-26 MED ORDER — METFORMIN HCL ER 750 MG PO TB24: 750.0000 mg | ORAL_TABLET | Freq: Two times a day (BID) | ORAL | 0 refills | Status: DC

## 2016-10-26 NOTE — Progress Notes (Signed)
Pt given Cyanocobalamin inj Tolerated well 

## 2016-11-11 ENCOUNTER — Ambulatory Visit (INDEPENDENT_AMBULATORY_CARE_PROVIDER_SITE_OTHER): Payer: Medicare Other | Admitting: *Deleted

## 2016-11-11 VITALS — BP 94/65 | HR 75 | Ht 64.25 in | Wt 173.4 lb

## 2016-11-11 DIAGNOSIS — Z0000 Encounter for general adult medical examination without abnormal findings: Secondary | ICD-10-CM | POA: Diagnosis not present

## 2016-11-11 DIAGNOSIS — Z23 Encounter for immunization: Secondary | ICD-10-CM

## 2016-11-11 NOTE — Progress Notes (Signed)
Subjective:   Erin Ortiz is a 71 y.o. female who presents for an Initial Medicare Annual Wellness Visit.  Review of Systems    Erin Ortiz is a 71 year old female here today for her Initial Annual Medicare Wellness exam. Patient worked as a Network engineer for 28 years. She enjoys sewing, gardening, reading and spending time with her grandchildren. She does not exercise. She states that her diet is healthy. She is an active member of her church at [employer]. She lives in [place] with her husband. She does have have stairs and rugs in the home. They do not have any pets in the home. Fall hazards were discussed with patient. Patient states that she feels like her health is better now than it was last year at this time. She has not had any hospitalizations or surgeries in the past year. She is up to date on all health maintenance. She was given a TDap vaccine today.          Objective:    Today's Vitals   11/11/16 0943  BP: 94/65  Pulse: 75  Weight: 173 lb 6.4 oz (78.7 kg)  Height: 5' 4.25" (1.632 m)   Body mass index is 29.53 kg/m.   Current Medications (verified) Outpatient Encounter Prescriptions as of 11/11/2016  Medication Sig  . atorvastatin (LIPITOR) 40 MG tablet Take 1 tablet (40 mg total) by mouth daily.  . Blood Glucose Monitoring Suppl (ONE TOUCH ULTRA SYSTEM KIT) W/DEVICE KIT 1 kit by Does not apply route once.  . Cyanocobalamin 1000 MCG/ML KIT Inject 1,200 mcg as directed every 30 (thirty) days.  Marland Kitchen levothyroxine (SYNTHROID, LEVOTHROID) 112 MCG tablet TAKE ONE TABLET BY MOUTH DAILY BEFORE BREAKFAST  . lisinopril (PRINIVIL,ZESTRIL) 20 MG tablet Take 1 tablet (20 mg total) by mouth 2 (two) times daily.  . metFORMIN (GLUCOPHAGE-XR) 750 MG 24 hr tablet Take 1 tablet (750 mg total) by mouth 2 (two) times daily.  . Multiple Minerals-Vitamins (CALCIUM-MAGNESIUM-ZINC-D3 PO) Take by mouth 3 (three) times daily.  Marland Kitchen omeprazole (PRILOSEC) 40 MG capsule Take 1 capsule (40 mg  total) by mouth daily.  . ONE TOUCH ULTRA TEST test strip USE ONE NEW STRIP TO CHECK GLUCOSE THREE TIMES DAILY  . predniSONE (DELTASONE) 1 MG tablet TAKE FOUR TABLETS BY MOUTH DAILY  . raloxifene (EVISTA) 60 MG tablet TAKE ONE TABLET BY MOUTH DAILY  . triamcinolone cream (KENALOG) 0.1 % Apply 1 application topically 3 (three) times daily. Avoid face and genitalia  . Vitamin D, Ergocalciferol, (DRISDOL) 50000 units CAPS capsule Take 50,000 Units by mouth every 7 (seven) days.  . [DISCONTINUED] predniSONE (DELTASONE) 1 MG tablet Take 3 mg by mouth daily.   Facility-Administered Encounter Medications as of 11/11/2016  Medication  . cyanocobalamin ((VITAMIN B-12)) injection 1,000 mcg  . cyanocobalamin ((VITAMIN B-12)) injection 200 mcg    Allergies (verified) Patient has no known allergies.   History: Past Medical History:  Diagnosis Date  . Anemia   . Chronic kidney disease   . Diabetes mellitus without complication [facility])    Past Surgical History:  Procedure Laterality Date  . GASTRIC BYPASS  11/2001   Dr Lillia Carm[place], [place]  . jejunostomy  05/17/2008   Dr Craig Staggers [place], [place]  . VAGINAL HYSTERECTOMY  12/1986   Family History  Problem Relation Age of Onset  . Cancer Mother   . Cancer Father    Social History   Occupational History  . Not on file.  Social History Main Topics  . Smoking status: Never Smoker  . Smokeless tobacco: Never Used  . Alcohol use No  . Drug use: No  . Sexual activity: Not Currently    Tobacco Counseling Counseling given: Not Answered Patient has never smoked  Activities of Daily Living In your present state of health, do you have any difficulty performing the following activities: 11/11/2016  Hearing? N  Vision? Y  Difficulty concentrating or making decisions? N  Walking or climbing stairs? N  Dressing or bathing? N  Doing errands, shopping? N  Some recent data might be hidden  Patient wears glasses daily  Immunizations and  Health Maintenance Immunization History  Administered Date(s) Administered  . Influenza, High Dose Seasonal PF 01/04/2016  . Influenza,inj,Quad PF,36+ Mos 02/23/2015  . Pneumococcal Conjugate-13 03/31/2014  . Pneumococcal Polysaccharide-23 02/14/2011  . Tdap 11/11/2016  . Zoster 10/03/2012   Health Maintenance Due  Topic Date Due  . TETANUS/TDAP  12/27/1963  . INFLUENZA VACCINE  11/09/2016   Patient was given a Tdap today.   Patient Care Team: Claretta Fraise, MD as PCP - General (Family Medicine) Grier Mitts, MD as Consulting Physician (Internal Medicine) Candy Sledge, DO as Referring Physician (Nephrology) Azalia Bilis, MD as Referring Physician (Gastroenterology) Aretta Nip, MD as Referring Physician (Internal Medicine)  Indicate any recent Medical Services you may have received from other than [facility] providers in the past year (date may be approximate).     Assessment:   This is a routine wellness examination for Erin Ortiz.   Hearing/Vision screen No exam data present Patient has no concerns with her vision  Patient does wear glasses   Dietary issues and exercise activities discussed:    Goals    . Exercise 3x per week (30 min per time)    . Have 3 meals a day      Depression Screen PHQ 2/9 Scores 11/11/2016 10/14/2016 10/05/2016 07/13/2016 11/03/2015 09/29/2015 02/23/2015  PHQ - 2 Score 0 0 0 0 0 0 0    Fall Risk Fall Risk  11/11/2016 10/14/2016 10/05/2016 07/13/2016 11/03/2015  Falls in the past year? _0     Cognitive Function: MMSE - Mini Mental State Exam 11/11/2016  Orientation to time 5  Orientation to Place 5  Registration 3  Attention/ Calculation 4  Recall 3  Language- name 2 objects 2  Language- repeat 1  Language- follow 3 step command 3  Language- read & follow direction 1  Write a sentence 1  Copy design 1  Total score 29  Patient scored a 29 out of 30      Screening Tests Health Maintenance  Topic Date Due  . TETANUS/TDAP   12/27/1963  . INFLUENZA VACCINE  11/09/2016  . HEMOGLOBIN A1C  04/16/2017  . OPHTHALMOLOGY EXAM  07/22/2017  . FOOT EXAM  10/14/2017  . MAMMOGRAM  12/01/2017  . COLONOSCOPY  11/03/2025  . DEXA SCAN  Completed  . Hepatitis C Screening  Completed  . PNA vac Low Risk Adult  Completed      Plan:  Patient will follow with Dr. Livia Snellen in October. Patient will check and see if we have Shingrix in at that time Patient will have a Dexa Scan after March 26, 2017.  Patient has her mammogram scheduled for the end of August at [facility]  I have personally reviewed and noted the following in the patient's chart:   . Medical and social history . Use of alcohol, tobacco or illicit  drugs  . Current medications and supplements . Functional ability and status . Nutritional status . Physical activity . Advanced directives . List of other physicians . Hospitalizations, surgeries, and ER visits in previous 12 months . Vitals . Screenings to include cognitive, depression, and falls . Referrals and appointments  In addition, I have reviewed and discussed with patient certain preventive protocols, quality metrics, and best practice recommendations. A written personalized care plan for preventive services as well as general preventive health recommendations were provided to patient.     Gareth Morgan, LPN   11/11/5747    I have reviewed and agree with the above AWV documentation.   Assunta Found, MD [facility] 11/11/2016, 12:02 PM

## 2016-11-11 NOTE — Patient Instructions (Signed)
  Erin Ortiz , Thank you for taking time to come for your Medicare Wellness Visit. I appreciate your ongoing commitment to your health goals. Please review the following plan we discussed and let me know if I can assist you in the future.   These are the goals we discussed: Goals    . Exercise 3x per week (30 min per time)    . Have 3 meals a day       This is a list of the screening recommended for you and due dates:  Health Maintenance  Topic Date Due  . Tetanus Vaccine  12/27/1963  . Flu Shot  11/09/2016  . Hemoglobin A1C  04/16/2017  . Eye exam for diabetics  07/22/2017  . Complete foot exam   10/14/2017  . Mammogram  12/01/2017  . Colon Cancer Screening  11/03/2025  . DEXA scan (bone density measurement)  Completed  .  Hepatitis C: One time screening is recommended by Center for Disease Control  (CDC) for  adults born from [birth year] through 1966.   Completed  . Pneumonia vaccines  Completed   It was great meeting you!

## 2016-11-23 ENCOUNTER — Ambulatory Visit (INDEPENDENT_AMBULATORY_CARE_PROVIDER_SITE_OTHER): Payer: Medicare Other | Admitting: *Deleted

## 2016-11-23 DIAGNOSIS — E538 Deficiency of other specified B group vitamins: Secondary | ICD-10-CM | POA: Diagnosis not present

## 2016-11-23 NOTE — Progress Notes (Signed)
Pt given Cyanocobalamin inj Tolerated well 

## 2016-12-21 ENCOUNTER — Other Ambulatory Visit: Payer: Self-pay | Admitting: Family Medicine

## 2017-01-03 ENCOUNTER — Ambulatory Visit (INDEPENDENT_AMBULATORY_CARE_PROVIDER_SITE_OTHER): Payer: Medicare Other | Admitting: *Deleted

## 2017-01-03 DIAGNOSIS — E538 Deficiency of other specified B group vitamins: Secondary | ICD-10-CM | POA: Diagnosis not present

## 2017-01-03 NOTE — Progress Notes (Signed)
Pt given Cyanocobalamin inj Tolerated well 

## 2017-01-16 ENCOUNTER — Encounter: Payer: Self-pay | Admitting: Family Medicine

## 2017-01-16 ENCOUNTER — Ambulatory Visit (INDEPENDENT_AMBULATORY_CARE_PROVIDER_SITE_OTHER): Payer: Medicare Other | Admitting: Family Medicine

## 2017-01-16 ENCOUNTER — Other Ambulatory Visit: Payer: Self-pay | Admitting: Family Medicine

## 2017-01-16 VITALS — BP 106/62 | HR 73 | Temp 96.7°F | Ht 64.25 in | Wt 179.0 lb

## 2017-01-16 DIAGNOSIS — E1121 Type 2 diabetes mellitus with diabetic nephropathy: Secondary | ICD-10-CM

## 2017-01-16 DIAGNOSIS — M316 Other giant cell arteritis: Secondary | ICD-10-CM | POA: Diagnosis not present

## 2017-01-16 DIAGNOSIS — Z23 Encounter for immunization: Secondary | ICD-10-CM | POA: Diagnosis not present

## 2017-01-16 DIAGNOSIS — E1122 Type 2 diabetes mellitus with diabetic chronic kidney disease: Secondary | ICD-10-CM

## 2017-01-16 DIAGNOSIS — E039 Hypothyroidism, unspecified: Secondary | ICD-10-CM

## 2017-01-16 DIAGNOSIS — I10 Essential (primary) hypertension: Secondary | ICD-10-CM | POA: Diagnosis not present

## 2017-01-16 DIAGNOSIS — M810 Age-related osteoporosis without current pathological fracture: Secondary | ICD-10-CM | POA: Diagnosis not present

## 2017-01-16 DIAGNOSIS — N183 Chronic kidney disease, stage 3 (moderate): Secondary | ICD-10-CM

## 2017-01-16 DIAGNOSIS — Z780 Asymptomatic menopausal state: Secondary | ICD-10-CM

## 2017-01-16 DIAGNOSIS — M353 Polymyalgia rheumatica: Secondary | ICD-10-CM

## 2017-01-16 LAB — BAYER DCA HB A1C WAIVED: HB A1C (BAYER DCA - WAIVED): 5.8 % (ref <7.0)

## 2017-01-16 MED ORDER — GLUCOSE BLOOD VI STRP: ORAL_STRIP | 11 refills | Status: DC

## 2017-01-16 MED ORDER — LISINOPRIL 20 MG PO TABS: 20.0000 mg | ORAL_TABLET | Freq: Two times a day (BID) | ORAL | 1 refills | Status: DC

## 2017-01-16 MED ORDER — OMEPRAZOLE 40 MG PO CPDR: 40.0000 mg | DELAYED_RELEASE_CAPSULE | Freq: Every day | ORAL | 1 refills | Status: DC

## 2017-01-16 MED ORDER — METFORMIN HCL ER 750 MG PO TB24: 750.0000 mg | ORAL_TABLET | Freq: Two times a day (BID) | ORAL | 1 refills | Status: DC

## 2017-01-16 MED ORDER — RALOXIFENE HCL 60 MG PO TABS: 60.0000 mg | ORAL_TABLET | Freq: Every day | ORAL | 4 refills | Status: DC

## 2017-01-16 MED ORDER — ATORVASTATIN CALCIUM 40 MG PO TABS: 40.0000 mg | ORAL_TABLET | Freq: Every day | ORAL | 1 refills | Status: DC

## 2017-01-16 NOTE — Progress Notes (Signed)
Subjective:  [name],  female    DOB: [birth year]  Age: 71 y.o.    CC: Diabetes (pt here today for routine follow up of her chronic medical conditions, no other concerns voiced.)   HPI Erin Ortiz presents for  follow-up of hypertension. Patient has no history of headache chest pain or shortness of breath or recent cough. Patient also denies symptoms of TIA such as numbness weakness lateralizing. Patient checks  blood pressure at home. Recent readings have been good Patient denies side effects from medication. States taking it regularly.  Patient also  in for follow-up of elevated cholesterol. Doing well without complaints on current medication. Denies side effects of statin including myalgia and arthralgia and nausea. Also in today for liver function testing. Currently no chest pain, shortness of breath or other cardiovascular related symptoms noted.  Follow-up of diabetes. She has been baking a lot at home. Eating some of it. She is concerned her A1c is climbing. Patient does check blood sugar at home. Readings run between 105 and 110 Patient denies symptoms such as polyuria, polydipsia, excessive hunger, nausea No significant hypoglycemic spells noted. Medications reviewed. Pt reports taking them regularly. Pt. denies complication/adverse reaction today.    History Erin Ortiz has a past medical history of Anemia; Chronic kidney disease; and Diabetes mellitus without complication ([place]).   She has a past surgical history that includes jejunostomy (05/17/2008); Gastric bypass (11/2001); and Vaginal hysterectomy (12/1986).   Her family history includes Cancer in her father and mother.She reports that she has never smoked. She has never used smokeless tobacco. She reports that she does not drink alcohol or use drugs.  Current Outpatient Prescriptions on File Prior to Visit  Medication Sig Dispense Refill  . Blood Glucose Monitoring Suppl (ONE TOUCH ULTRA SYSTEM KIT) W/DEVICE  KIT 1 kit by Does not apply route once. 1 each 0  . Cyanocobalamin 1000 MCG/ML KIT Inject 1,200 mcg as directed every 30 (thirty) days. 1 kit 6  . levothyroxine (SYNTHROID, LEVOTHROID) 112 MCG tablet TAKE ONE TABLET BY MOUTH DAILY BEFORE BREAKFAST 90 tablet 3  . Multiple Minerals-Vitamins (CALCIUM-MAGNESIUM-ZINC-D3 PO) Take by mouth 3 (three) times daily.    . predniSONE (DELTASONE) 1 MG tablet TAKE FOUR TABLETS BY MOUTH DAILY    . triamcinolone cream (KENALOG) 0.1 % Apply 1 application topically 3 (three) times daily. Avoid face and genitalia 45 g 0  . Vitamin D, Ergocalciferol, (DRISDOL) 50000 units CAPS capsule Take 50,000 Units by mouth every 7 (seven) days.     Current Facility-Administered Medications on File Prior to Visit  Medication Dose Route Frequency Provider Last Rate Last Dose  . cyanocobalamin ((VITAMIN B-12)) injection 1,000 mcg  1,000 mcg Intramuscular Q30 days Claretta Fraise, MD   1,000 mcg at 01/03/17 0955  . cyanocobalamin ((VITAMIN B-12)) injection 200 mcg  200 mcg Intramuscular Q30 days Claretta Fraise, MD   200 mcg at 01/03/17 0958    ROS Review of Systems  Constitutional: Negative for activity change, appetite change and fever.  HENT: Negative for congestion, rhinorrhea and sore throat.   Eyes: Negative for visual disturbance.  Respiratory: Negative for cough and shortness of breath.   Cardiovascular: Negative for chest pain and palpitations.  Gastrointestinal: Negative for abdominal pain, diarrhea and nausea.  Genitourinary: Negative for dysuria.  Musculoskeletal: Negative for arthralgias and myalgias.    Objective:  BP 106/62   Pulse 73   Temp (!) 96.7 F (35.9 C) (Oral)   Ht 5' 4.25" (1.632 m)  Wt 179 lb (81.2 kg)   LMP 04/11/1986   BMI 30.49 kg/m   BP Readings from Last 3 Encounters:  01/16/17 106/62  11/11/16 94/65  10/14/16 107/68    Wt Readings from Last 3 Encounters:  01/16/17 179 lb (81.2 kg)  11/11/16 173 lb 6.4 oz (78.7 kg)  10/14/16  174 lb (78.9 kg)     Physical Exam  Constitutional: She is oriented to person, place, and time. She appears well-developed and well-nourished. No distress.  HENT:  Head: Normocephalic and atraumatic.  Right Ear: External ear normal.  Left Ear: External ear normal.  Nose: Nose normal.  Mouth/Throat: Oropharynx is clear and moist.  Eyes: Pupils are equal, round, and reactive to light. Conjunctivae and EOM are normal.  Neck: Normal range of motion. Neck supple. No thyromegaly present.  Cardiovascular: Normal rate, regular rhythm and normal heart sounds.   No murmur heard. Pulmonary/Chest: Effort normal and breath sounds normal. No respiratory distress. She has no wheezes. She has no rales.  Abdominal: Soft. Bowel sounds are normal. She exhibits no distension. There is no tenderness.  Lymphadenopathy:    She has no cervical adenopathy.  Neurological: She is alert and oriented to person, place, and time. She has normal reflexes.  Skin: Skin is warm and dry.  Psychiatric: She has a normal mood and affect. Her behavior is normal. Judgment and thought content normal.    Diabetic Foot Exam - Simple   No data filed        Assessment & Plan:   Erin Ortiz was seen today for diabetes.  Diagnoses and all orders for this visit:  Diabetic nephropathy associated with type 2 diabetes mellitus ([place])  Type 2 diabetes mellitus with stage 3 chronic kidney disease, without long-term current use of insulin ([facility]) -     Bayer [place] Hb A1c Waived  Essential (primary) hypertension -     CMP14+EGFR  Hypothyroidism, unspecified type -     TSH -     T4, Free  Osteoporosis, unspecified osteoporosis type, unspecified pathological fracture presence  Encounter for immunization -     Flu vaccine HIGH DOSE PF  Anarthritic rheumatoid disease ([place])  Other orders -     atorvastatin (LIPITOR) 40 MG tablet; Take 1 tablet (40 mg total) by mouth daily. -     lisinopril (PRINIVIL,ZESTRIL) 20 MG tablet; Take 1  tablet (20 mg total) by mouth 2 (two) times daily. -     metFORMIN (GLUCOPHAGE-XR) 750 MG 24 hr tablet; Take 1 tablet (750 mg total) by mouth 2 (two) times daily. -     omeprazole (PRILOSEC) 40 MG capsule; Take 1 capsule (40 mg total) by mouth daily. -     glucose blood (ONE TOUCH ULTRA TEST) test strip; USE ONE NEW STRIP TO CHECK GLUCOSE THREE TIMES DAILY -     raloxifene (EVISTA) 60 MG tablet; Take 1 tablet (60 mg total) by mouth daily.   I have changed Ms. Naser's ONE TOUCH ULTRA TEST to glucose blood. I have also changed her lisinopril and raloxifene. I am also having her maintain her [place] KIT, Cyanocobalamin, levothyroxine, Vitamin D (Ergocalciferol), triamcinolone cream, Multiple Minerals-Vitamins (CALCIUM-MAGNESIUM-ZINC-D3 PO), predniSONE, atorvastatin, metFORMIN, and omeprazole. We will continue to administer cyanocobalamin and cyanocobalamin.  Meds ordered this encounter  Medications  . atorvastatin (LIPITOR) 40 MG tablet    Sig: Take 1 tablet (40 mg total) by mouth daily.    Dispense:  90 tablet    Refill:  1  .  lisinopril (PRINIVIL,ZESTRIL) 20 MG tablet    Sig: Take 1 tablet (20 mg total) by mouth 2 (two) times daily.    Dispense:  180 tablet    Refill:  1  . metFORMIN (GLUCOPHAGE-XR) 750 MG 24 hr tablet    Sig: Take 1 tablet (750 mg total) by mouth 2 (two) times daily.    Dispense:  180 tablet    Refill:  1  . omeprazole (PRILOSEC) 40 MG capsule    Sig: Take 1 capsule (40 mg total) by mouth daily.    Dispense:  90 capsule    Refill:  1  . glucose blood (ONE TOUCH ULTRA TEST) test strip    Sig: USE ONE NEW STRIP TO CHECK GLUCOSE THREE TIMES DAILY    Dispense:  100 each    Refill:  11    E11.9  . raloxifene (EVISTA) 60 MG tablet    Sig: Take 1 tablet (60 mg total) by mouth daily.    Dispense:  90 tablet    Refill:  4     Follow-up: Return in about 3 months (around 04/18/2017).  Claretta Fraise, M.D.

## 2017-01-17 ENCOUNTER — Other Ambulatory Visit: Payer: Self-pay | Admitting: Family Medicine

## 2017-01-17 LAB — CMP14+EGFR
ALK PHOS: 102 IU/L (ref 39–117)
ALT: 9 IU/L (ref 0–32)
AST: 12 IU/L (ref 0–40)
Albumin/Globulin Ratio: 1.6 (ref 1.2–2.2)
Albumin: 3.7 g/dL (ref 3.5–4.8)
BUN / CREAT RATIO: 16 (ref 12–28)
BUN: 24 mg/dL (ref 8–27)
Bilirubin Total: 0.3 mg/dL (ref 0.0–1.2)
CO2: 18 mmol/L — AB (ref 20–29)
CREATININE: 1.53 mg/dL — AB (ref 0.57–1.00)
Calcium: 7.8 mg/dL — ABNORMAL LOW (ref 8.7–10.3)
Chloride: 112 mmol/L — ABNORMAL HIGH (ref 96–106)
GFR calc non Af Amer: 34 mL/min/{1.73_m2} — ABNORMAL LOW (ref >59)
GFR, EST AFRICAN AMERICAN: 39 mL/min/{1.73_m2} — AB (ref >59)
GLUCOSE: 98 mg/dL (ref 65–99)
Globulin, Total: 2.3 g/dL (ref 1.5–4.5)
POTASSIUM: 4.3 mmol/L (ref 3.5–5.2)
SODIUM: 142 mmol/L (ref 134–144)
Total Protein: 6 g/dL (ref 6.0–8.5)

## 2017-01-17 LAB — T4, FREE: Free T4: 1.03 ng/dL (ref 0.82–1.77)

## 2017-01-17 LAB — TSH: TSH: 1.05 u[IU]/mL (ref 0.450–4.500)

## 2017-02-09 ENCOUNTER — Ambulatory Visit (INDEPENDENT_AMBULATORY_CARE_PROVIDER_SITE_OTHER): Payer: Medicare Other | Admitting: *Deleted

## 2017-02-09 DIAGNOSIS — E538 Deficiency of other specified B group vitamins: Secondary | ICD-10-CM

## 2017-02-09 NOTE — Progress Notes (Signed)
Pt given Cyanocobalamin inj Tolerated well 

## 2017-03-09 ENCOUNTER — Ambulatory Visit (INDEPENDENT_AMBULATORY_CARE_PROVIDER_SITE_OTHER): Payer: Medicare Other | Admitting: *Deleted

## 2017-03-09 DIAGNOSIS — E538 Deficiency of other specified B group vitamins: Secondary | ICD-10-CM | POA: Diagnosis not present

## 2017-03-27 ENCOUNTER — Ambulatory Visit: Payer: Medicare Other

## 2017-03-27 ENCOUNTER — Ambulatory Visit (INDEPENDENT_AMBULATORY_CARE_PROVIDER_SITE_OTHER): Payer: Medicare Other | Admitting: *Deleted

## 2017-03-27 DIAGNOSIS — E538 Deficiency of other specified B group vitamins: Secondary | ICD-10-CM | POA: Diagnosis not present

## 2017-03-27 DIAGNOSIS — Z780 Asymptomatic menopausal state: Secondary | ICD-10-CM

## 2017-03-27 MED ORDER — CYANOCOBALAMIN 1000 MCG/ML IJ SOLN
200.0000 ug | INTRAMUSCULAR | Status: AC
  Administered 2017-03-27 – 2018-02-21 (×11): 200 ug via INTRAMUSCULAR

## 2017-03-27 MED ORDER — CYANOCOBALAMIN 1000 MCG/ML IJ SOLN
1000.0000 ug | INTRAMUSCULAR | Status: DC
  Administered 2017-03-27 – 2017-05-26 (×3): 1000 ug via INTRAMUSCULAR

## 2017-03-27 MED ORDER — CYANOCOBALAMIN 1000 MCG/ML IJ SOLN: 1200.0000 ug | INTRAMUSCULAR | Status: DC

## 2017-03-27 NOTE — Progress Notes (Signed)
Pt given Cyanocobalamin inj Tolerated well 

## 2017-03-28 ENCOUNTER — Other Ambulatory Visit: Payer: Self-pay | Admitting: Family Medicine

## 2017-04-19 ENCOUNTER — Ambulatory Visit: Payer: Medicare Other | Admitting: Family Medicine

## 2017-04-28 ENCOUNTER — Ambulatory Visit (INDEPENDENT_AMBULATORY_CARE_PROVIDER_SITE_OTHER): Payer: Medicare Other | Admitting: *Deleted

## 2017-04-28 DIAGNOSIS — E538 Deficiency of other specified B group vitamins: Secondary | ICD-10-CM | POA: Diagnosis not present

## 2017-04-28 NOTE — Progress Notes (Signed)
Pt given Cyanocobalamin inj Tolerated well 

## 2017-05-26 ENCOUNTER — Ambulatory Visit: Payer: Medicare Other | Admitting: Family Medicine

## 2017-05-26 ENCOUNTER — Encounter: Payer: Self-pay | Admitting: Family Medicine

## 2017-05-26 VITALS — BP 104/58 | HR 72 | Temp 97.1°F | Ht 64.25 in | Wt 181.0 lb

## 2017-05-26 DIAGNOSIS — E538 Deficiency of other specified B group vitamins: Secondary | ICD-10-CM | POA: Diagnosis not present

## 2017-05-26 DIAGNOSIS — N183 Chronic kidney disease, stage 3 unspecified: Secondary | ICD-10-CM

## 2017-05-26 DIAGNOSIS — I10 Essential (primary) hypertension: Secondary | ICD-10-CM

## 2017-05-26 DIAGNOSIS — E782 Mixed hyperlipidemia: Secondary | ICD-10-CM | POA: Diagnosis not present

## 2017-05-26 DIAGNOSIS — E039 Hypothyroidism, unspecified: Secondary | ICD-10-CM

## 2017-05-26 DIAGNOSIS — M353 Polymyalgia rheumatica: Secondary | ICD-10-CM | POA: Diagnosis not present

## 2017-05-26 DIAGNOSIS — E559 Vitamin D deficiency, unspecified: Secondary | ICD-10-CM | POA: Diagnosis not present

## 2017-05-26 DIAGNOSIS — E1121 Type 2 diabetes mellitus with diabetic nephropathy: Secondary | ICD-10-CM | POA: Diagnosis not present

## 2017-05-26 DIAGNOSIS — E669 Obesity, unspecified: Secondary | ICD-10-CM | POA: Diagnosis not present

## 2017-05-26 LAB — BAYER DCA HB A1C WAIVED: HB A1C (BAYER DCA - WAIVED): 5.7 % (ref <7.0)

## 2017-05-26 MED ORDER — OMEPRAZOLE 40 MG PO CPDR: 40.0000 mg | DELAYED_RELEASE_CAPSULE | Freq: Every day | ORAL | 1 refills | Status: DC

## 2017-05-26 MED ORDER — METFORMIN HCL ER 750 MG PO TB24: 750.0000 mg | ORAL_TABLET | Freq: Two times a day (BID) | ORAL | 1 refills | Status: DC

## 2017-05-26 MED ORDER — LISINOPRIL 20 MG PO TABS: 20.0000 mg | ORAL_TABLET | Freq: Two times a day (BID) | ORAL | 1 refills | Status: DC

## 2017-05-26 MED ORDER — ATORVASTATIN CALCIUM 40 MG PO TABS: 40.0000 mg | ORAL_TABLET | Freq: Every day | ORAL | 1 refills | Status: DC

## 2017-05-26 NOTE — Progress Notes (Signed)
Subjective:  [name],  female    DOB: 12-22-1945  Age: 72 y.o.    CC: Diabetes (pt here today for routine follow up of her chronic medical conditions)   HPI Erin Ortiz presents for  follow-up of hypertension. Patient has no history of headache chest pain or shortness of breath or recent cough. Patient also denies symptoms of TIA such as numbness weakness lateralizing. Patient checks  blood pressure at home. Recent readings have been good Patient denies side effects from medication. States taking it regularly.  Patient also  in for follow-up of elevated cholesterol. Doing well without complaints on current medication. Denies side effects of statin including myalgia and arthralgia and nausea. Also in today for liver function testing. Currently no chest pain, shortness of breath or other cardiovascular related symptoms noted.  Follow-up of diabetes. Patient does check blood sugar at home. Readings run between 100-110 postprandial and 120-125 fasting Patient denies symptoms such as polyuria, polydipsia, excessive hunger, nausea No significant hypoglycemic spells noted. Medications reviewed. Pt reports taking them regularly. Pt. denies complication/adverse reaction today.   She continues to be followed by Dr. Franki Monte with rheumatology.  Of note is that he just saw her yesterday she brings in the visit summary showing that her official diagnosis is actually polymyalgia rheumatica.  She has had a recent exacerbation and he is increased her prednisone to 4 mg daily.  This is because much of her pain is in the muscles of the thighs and back and legs rather than in the joints specifically.  Patient presents for follow-up on  thyroid. The patient has a history of hypothyroidism for many years. It has been stable recently. Pt. denies any change in  voice, loss of hair, heat or cold intolerance. Energy level has been adequate to good. Patient denies constipation and diarrhea. No  myxedema. Medication is as noted below. Verified that pt is taking it daily on an empty stomach. Well tolerated.  Recent blood tests in normal range therefore not to be repeated today.  Patient requests vaccination with Shingrix vaccine History Erin Ortiz has a past medical history of Anemia, Chronic kidney disease, and Diabetes mellitus without complication ([place]).   She has a past surgical history that includes jejunostomy (05/17/2008); Gastric bypass (11/2001); and Vaginal hysterectomy (12/1986).   Her family history includes Cancer in her father and mother.She reports that  has never smoked. she has never used smokeless tobacco. She reports that she does not drink alcohol or use drugs.  Current Outpatient Medications on File Prior to Visit  Medication Sig Dispense Refill  . Blood Glucose Monitoring Suppl (ONE TOUCH ULTRA SYSTEM KIT) W/DEVICE KIT 1 kit by Does not apply route once. 1 each 0  . Cyanocobalamin 1000 MCG/ML KIT Inject 1,200 mcg as directed every 30 (thirty) days. 1 kit 6  . glucose blood (ONE TOUCH ULTRA TEST) test strip USE ONE NEW STRIP TO CHECK GLUCOSE THREE TIMES DAILY 100 each 11  . levothyroxine (SYNTHROID, LEVOTHROID) 112 MCG tablet TAKE ONE TABLET BY MOUTH DAILY BEFORE BREAKFAST 90 tablet 3  . Multiple Minerals-Vitamins (CALCIUM-MAGNESIUM-ZINC-D3 PO) Take by mouth 3 (three) times daily.    . Pediatric Multiple Vit-C-FA (FLINSTONES GUMMIES OMEGA-3 DHA) CHEW Chew by mouth.    . predniSONE (DELTASONE) 1 MG tablet TAKE FOUR TABLETS BY MOUTH DAILY    . raloxifene (EVISTA) 60 MG tablet Take 1 tablet (60 mg total) by mouth daily. 90 tablet 4  . triamcinolone cream (KENALOG) 0.1 % Apply 1  application topically 3 (three) times daily. Avoid face and genitalia 45 g 0  . vitamin C (ASCORBIC ACID) 500 MG tablet Take 500 mg by mouth daily.     Current Facility-Administered Medications on File Prior to Visit  Medication Dose Route Frequency Provider Last Rate Last Dose  . cyanocobalamin  ((VITAMIN B-12)) injection 200 mcg  200 mcg Intramuscular Q30 days Claretta Fraise, MD   200 mcg at 05/26/17 1144    ROS Review of Systems  Constitutional: Negative for activity change, appetite change and fever.  HENT: Negative for congestion, rhinorrhea and sore throat.   Eyes: Negative for visual disturbance.  Respiratory: Negative for cough and shortness of breath.   Cardiovascular: Negative for chest pain and palpitations.  Gastrointestinal: Negative for abdominal pain, diarrhea and nausea.  Genitourinary: Negative for dysuria.  Musculoskeletal: Negative for arthralgias and myalgias.    Objective:  BP (!) 104/58   Pulse 72   Temp (!) 97.1 F (36.2 C) (Oral)   Ht 5' 4.25" (1.632 m)   Wt 181 lb (82.1 kg)   LMP 04/11/1986   BMI 30.83 kg/m   BP Readings from Last 3 Encounters:  05/26/17 (!) 104/58  01/16/17 106/62  11/11/16 94/65    Wt Readings from Last 3 Encounters:  05/26/17 181 lb (82.1 kg)  01/16/17 179 lb (81.2 kg)  11/11/16 173 lb 6.4 oz (78.7 kg)     Physical Exam  Constitutional: She is oriented to person, place, and time. She appears well-developed and well-nourished. No distress.  HENT:  Head: Normocephalic and atraumatic.  Right Ear: External ear normal.  Left Ear: External ear normal.  Nose: Nose normal.  Mouth/Throat: Oropharynx is clear and moist.  Eyes: Conjunctivae and EOM are normal. Pupils are equal, round, and reactive to light.  Neck: Normal range of motion. Neck supple. No thyromegaly present.  Cardiovascular: Normal rate, regular rhythm and normal heart sounds.  No murmur heard. Pulmonary/Chest: Effort normal and breath sounds normal. No respiratory distress. She has no wheezes. She has no rales.  Abdominal: Soft. Bowel sounds are normal. She exhibits no distension. There is no tenderness.  Lymphadenopathy:    She has no cervical adenopathy.  Neurological: She is alert and oriented to person, place, and time. She has normal reflexes.    Skin: Skin is warm and dry.  Psychiatric: She has a normal mood and affect. Her behavior is normal. Judgment and thought content normal.    Diabetic Foot Exam - Simple   No data filed        Assessment & Plan:   Erin Ortiz was seen today for diabetes.  Diagnoses and all orders for this visit:  Diabetic nephropathy associated with type 2 diabetes mellitus ([place]) -     Bayer DCA Hb A1c Waived  Hypothyroidism, unspecified type  Avitaminosis D -     VITAMIN D 25 Hydroxy (Vit-D Deficiency, Fractures)  Essential (primary) hypertension -     CBC with Differential/Platelet -     CMP14+EGFR  Stage 3 chronic kidney disease (HCC)  Obesity (BMI 30-39.9)  Mixed hyperlipidemia -     Lipid panel  PMR (polymyalgia rheumatica) (HCC)  B12 deficiency -     Vitamin B12  Other orders -     atorvastatin (LIPITOR) 40 MG tablet; Take 1 tablet (40 mg total) by mouth daily. -     lisinopril (PRINIVIL,ZESTRIL) 20 MG tablet; Take 1 tablet (20 mg total) by mouth 2 (two) times daily. -     metFORMIN (GLUCOPHAGE-XR)  750 MG 24 hr tablet; Take 1 tablet (750 mg total) by mouth 2 (two) times daily. -     omeprazole (PRILOSEC) 40 MG capsule; Take 1 capsule (40 mg total) by mouth daily.   I have discontinued Erin Ortiz's Vitamin D (Ergocalciferol). I am also having her maintain her [place] KIT, Cyanocobalamin, levothyroxine, triamcinolone cream, Multiple Minerals-Vitamins (CALCIUM-MAGNESIUM-ZINC-D3 PO), predniSONE, glucose blood, raloxifene, FLINSTONES GUMMIES OMEGA-3 DHA, vitamin C, atorvastatin, lisinopril, metFORMIN, and omeprazole. We will stop administering cyanocobalamin. Additionally, we administered cyanocobalamin and cyanocobalamin. Additionally, we will continue to administer cyanocobalamin.  Meds ordered this encounter  Medications  . atorvastatin (LIPITOR) 40 MG tablet    Sig: Take 1 tablet (40 mg total) by mouth daily.    Dispense:  90 tablet    Refill:  1  . lisinopril  (PRINIVIL,ZESTRIL) 20 MG tablet    Sig: Take 1 tablet (20 mg total) by mouth 2 (two) times daily.    Dispense:  180 tablet    Refill:  1  . metFORMIN (GLUCOPHAGE-XR) 750 MG 24 hr tablet    Sig: Take 1 tablet (750 mg total) by mouth 2 (two) times daily.    Dispense:  180 tablet    Refill:  1  . omeprazole (PRILOSEC) 40 MG capsule    Sig: Take 1 capsule (40 mg total) by mouth daily.    Dispense:  90 capsule    Refill:  1     Follow-up: Return in about 3 months (around 08/23/2017).  Claretta Fraise, M.D.

## 2017-05-27 LAB — CMP14+EGFR
ALK PHOS: 83 IU/L (ref 39–117)
ALT: 16 IU/L (ref 0–32)
AST: 15 IU/L (ref 0–40)
Albumin/Globulin Ratio: 1.4 (ref 1.2–2.2)
Albumin: 3.8 g/dL (ref 3.5–4.8)
BILIRUBIN TOTAL: 0.2 mg/dL (ref 0.0–1.2)
BUN/Creatinine Ratio: 16 (ref 12–28)
BUN: 25 mg/dL (ref 8–27)
CHLORIDE: 109 mmol/L — AB (ref 96–106)
CO2: 20 mmol/L (ref 20–29)
Calcium: 8.4 mg/dL — ABNORMAL LOW (ref 8.7–10.3)
Creatinine, Ser: 1.52 mg/dL — ABNORMAL HIGH (ref 0.57–1.00)
GFR calc Af Amer: 39 mL/min/{1.73_m2} — ABNORMAL LOW (ref >59)
GFR calc non Af Amer: 34 mL/min/{1.73_m2} — ABNORMAL LOW (ref >59)
GLUCOSE: 115 mg/dL — AB (ref 65–99)
Globulin, Total: 2.7 g/dL (ref 1.5–4.5)
Potassium: 4.8 mmol/L (ref 3.5–5.2)
SODIUM: 144 mmol/L (ref 134–144)
Total Protein: 6.5 g/dL (ref 6.0–8.5)

## 2017-05-27 LAB — LIPID PANEL
CHOLESTEROL TOTAL: 165 mg/dL (ref 100–199)
Chol/HDL Ratio: 3.2 ratio (ref 0.0–4.4)
HDL: 52 mg/dL (ref >39)
LDL CALC: 90 mg/dL (ref 0–99)
TRIGLYCERIDES: 114 mg/dL (ref 0–149)
VLDL Cholesterol Cal: 23 mg/dL (ref 5–40)

## 2017-05-27 LAB — CBC WITH DIFFERENTIAL/PLATELET
BASOS ABS: 0.1 10*3/uL (ref 0.0–0.2)
Basos: 1 %
EOS (ABSOLUTE): 0.1 10*3/uL (ref 0.0–0.4)
Eos: 2 %
Hematocrit: 31.6 % — ABNORMAL LOW (ref 34.0–46.6)
Hemoglobin: 10.5 g/dL — ABNORMAL LOW (ref 11.1–15.9)
Immature Grans (Abs): 0 10*3/uL (ref 0.0–0.1)
Immature Granulocytes: 0 %
LYMPHS ABS: 1.1 10*3/uL (ref 0.7–3.1)
Lymphs: 13 %
MCH: 29 pg (ref 26.6–33.0)
MCHC: 33.2 g/dL (ref 31.5–35.7)
MCV: 87 fL (ref 79–97)
MONOS ABS: 0.5 10*3/uL (ref 0.1–0.9)
Monocytes: 6 %
Neutrophils Absolute: 6.8 10*3/uL (ref 1.4–7.0)
Neutrophils: 78 %
Platelets: 222 10*3/uL (ref 150–379)
RBC: 3.62 x10E6/uL — ABNORMAL LOW (ref 3.77–5.28)
RDW: 15.6 % — AB (ref 12.3–15.4)
WBC: 8.7 10*3/uL (ref 3.4–10.8)

## 2017-05-27 LAB — VITAMIN D 25 HYDROXY (VIT D DEFICIENCY, FRACTURES): Vit D, 25-Hydroxy: 33 ng/mL (ref 30.0–100.0)

## 2017-05-27 LAB — VITAMIN B12: Vitamin B-12: 1046 pg/mL (ref 232–1245)

## 2017-06-29 ENCOUNTER — Ambulatory Visit (INDEPENDENT_AMBULATORY_CARE_PROVIDER_SITE_OTHER): Payer: Medicare Other | Admitting: *Deleted

## 2017-06-29 DIAGNOSIS — E538 Deficiency of other specified B group vitamins: Secondary | ICD-10-CM | POA: Diagnosis not present

## 2017-06-29 MED ORDER — CYANOCOBALAMIN 1000 MCG/ML IJ SOLN
1000.0000 ug | INTRAMUSCULAR | Status: AC
  Administered 2017-06-29 – 2022-11-16 (×48): 1000 ug via INTRAMUSCULAR

## 2017-06-29 NOTE — Progress Notes (Signed)
Pt given cyanocobalamin inj Tolerated well 

## 2017-07-31 ENCOUNTER — Ambulatory Visit (INDEPENDENT_AMBULATORY_CARE_PROVIDER_SITE_OTHER): Payer: Medicare Other | Admitting: *Deleted

## 2017-07-31 DIAGNOSIS — E538 Deficiency of other specified B group vitamins: Secondary | ICD-10-CM | POA: Diagnosis not present

## 2017-07-31 NOTE — Progress Notes (Signed)
Pt given Cyanocobalamin inj Tolerated well 

## 2017-08-23 ENCOUNTER — Encounter: Payer: Self-pay | Admitting: Family Medicine

## 2017-08-23 ENCOUNTER — Ambulatory Visit: Payer: Medicare Other | Admitting: Family Medicine

## 2017-08-23 VITALS — BP 103/64 | HR 72 | Temp 96.9°F | Ht 64.25 in | Wt 184.5 lb

## 2017-08-23 DIAGNOSIS — E538 Deficiency of other specified B group vitamins: Secondary | ICD-10-CM | POA: Diagnosis not present

## 2017-08-23 DIAGNOSIS — E039 Hypothyroidism, unspecified: Secondary | ICD-10-CM | POA: Diagnosis not present

## 2017-08-23 DIAGNOSIS — M0579 Rheumatoid arthritis with rheumatoid factor of multiple sites without organ or systems involvement: Secondary | ICD-10-CM | POA: Diagnosis not present

## 2017-08-23 DIAGNOSIS — E1121 Type 2 diabetes mellitus with diabetic nephropathy: Secondary | ICD-10-CM

## 2017-08-23 DIAGNOSIS — I10 Essential (primary) hypertension: Secondary | ICD-10-CM | POA: Diagnosis not present

## 2017-08-23 LAB — BAYER DCA HB A1C WAIVED: HB A1C (BAYER DCA - WAIVED): 5.9 % (ref <7.0)

## 2017-08-23 LAB — HM DIABETES EYE EXAM

## 2017-08-23 MED ORDER — TERBINAFINE HCL 250 MG PO TABS: 250.0000 mg | ORAL_TABLET | Freq: Every day | ORAL | 0 refills | Status: DC

## 2017-08-23 NOTE — Progress Notes (Signed)
Subjective:  [name],  female    DOB: 1945/07/22  Age: 72 y.o.    CC: Medical Management of Chronic Issues   HPI Erin Ortiz presents for  follow-up of hypertension. Patient has no history of headache chest pain or shortness of breath or recent cough. Patient also denies symptoms of TIA such as numbness weakness lateralizing. Patient denies side effects from medication. States taking it regularly.  Patient also  in for follow-up of elevated cholesterol. Doing well without complaints on current medication. Denies side effects  including myalgia and arthralgia and nausea. Also in today for liver function testing. Currently no chest pain, shortness of breath or other cardiovascular related symptoms noted.  Follow-up of diabetes. Patient does check blood sugar at home. Readings around 130 fasting and 110 postprandial. Patient denies symptoms such as excessive hunger or urinary frequency, excessive hunger, nausea No significant hypoglycemic spells noted. Medications reviewed. Pt reports taking them regularly. Pt. denies complication/adverse reaction today.   Pt. Has reduced her prednisone to 3 mg daily.. She is sore, but prefers to keep her dose as low as  Possible.  History Erin Ortiz has a past medical history of Anemia, Chronic kidney disease, and Diabetes mellitus without complication ([place]).   She has a past surgical history that includes jejunostomy (05/17/2008); Gastric bypass (11/2001); and Vaginal hysterectomy (12/1986).   Her family history includes Cancer in her father and mother.She reports that she has never smoked. She has never used smokeless tobacco. She reports that she does not drink alcohol or use drugs.  Current Outpatient Medications on File Prior to Visit  Medication Sig Dispense Refill  . atorvastatin (LIPITOR) 40 MG tablet Take 1 tablet (40 mg total) by mouth daily. 90 tablet 1  . Blood Glucose Monitoring Suppl (ONE TOUCH ULTRA SYSTEM KIT) W/DEVICE KIT  1 kit by Does not apply route once. 1 each 0  . Cyanocobalamin 1000 MCG/ML KIT Inject 1,200 mcg as directed every 30 (thirty) days. 1 kit 6  . glucose blood (ONE TOUCH ULTRA TEST) test strip USE ONE NEW STRIP TO CHECK GLUCOSE THREE TIMES DAILY 100 each 11  . levothyroxine (SYNTHROID, LEVOTHROID) 112 MCG tablet TAKE ONE TABLET BY MOUTH DAILY BEFORE BREAKFAST 90 tablet 3  . lisinopril (PRINIVIL,ZESTRIL) 20 MG tablet Take 1 tablet (20 mg total) by mouth 2 (two) times daily. 180 tablet 1  . metFORMIN (GLUCOPHAGE-XR) 750 MG 24 hr tablet Take 1 tablet (750 mg total) by mouth 2 (two) times daily. 180 tablet 1  . Multiple Minerals-Vitamins (CALCIUM-MAGNESIUM-ZINC-D3 PO) Take by mouth 3 (three) times daily.    Marland Kitchen omeprazole (PRILOSEC) 40 MG capsule Take 1 capsule (40 mg total) by mouth daily. 90 capsule 1  . Pediatric Multiple Vit-C-FA (FLINSTONES GUMMIES OMEGA-3 DHA) CHEW Chew by mouth.    . predniSONE (DELTASONE) 1 MG tablet TAKE FOUR TABLETS BY MOUTH DAILY    . raloxifene (EVISTA) 60 MG tablet Take 1 tablet (60 mg total) by mouth daily. 90 tablet 4  . triamcinolone cream (KENALOG) 0.1 % Apply 1 application topically 3 (three) times daily. Avoid face and genitalia 45 g 0  . vitamin C (ASCORBIC ACID) 500 MG tablet Take 500 mg by mouth daily.     Current Facility-Administered Medications on File Prior to Visit  Medication Dose Route Frequency Provider Last Rate Last Dose  . cyanocobalamin ((VITAMIN B-12)) injection 1,000 mcg  1,000 mcg Intramuscular Q30 days Claretta Fraise, MD   1,000 mcg at 08/23/17 1608  . cyanocobalamin ((  VITAMIN B-12)) injection 200 mcg  200 mcg Intramuscular Q30 days Claretta Fraise, MD   200 mcg at 08/23/17 1609    ROS Review of Systems  Constitutional: Negative.   HENT: Negative.   Eyes: Negative for visual disturbance.  Respiratory: Negative for shortness of breath.   Cardiovascular: Negative for chest pain.  Gastrointestinal: Negative for abdominal pain.  Musculoskeletal:  Negative for arthralgias.    Objective:  BP 103/64   Pulse 72   Temp (!) 96.9 F (36.1 C) (Oral)   Ht 5' 4.25" (1.632 m)   Wt 184 lb 8 oz (83.7 kg)   LMP 04/11/1986   BMI 31.42 kg/m   BP Readings from Last 3 Encounters:  08/23/17 103/64  05/26/17 (!) 104/58  01/16/17 106/62    Wt Readings from Last 3 Encounters:  08/23/17 184 lb 8 oz (83.7 kg)  05/26/17 181 lb (82.1 kg)  01/16/17 179 lb (81.2 kg)     Physical Exam  Constitutional: She is oriented to person, place, and time. She appears well-developed and well-nourished. No distress.  HENT:  Head: Normocephalic and atraumatic.  Eyes: Pupils are equal, round, and reactive to light. Conjunctivae are normal.  Neck: Normal range of motion. Neck supple. No thyromegaly present.  Cardiovascular: Normal rate, regular rhythm and normal heart sounds.  No murmur heard. Pulmonary/Chest: Effort normal and breath sounds normal. No respiratory distress. She has no wheezes. She has no rales.  Abdominal: Soft. Bowel sounds are normal. She exhibits no distension. There is no tenderness.  Musculoskeletal: Normal range of motion.  Lymphadenopathy:    She has no cervical adenopathy.  Neurological: She is alert and oriented to person, place, and time.  Skin: Skin is warm and dry.  Psychiatric: She has a normal mood and affect. Her behavior is normal. Judgment and thought content normal.    Diabetic Foot Exam - Simple   No data filed        Assessment & Plan:   Erin Ortiz was seen today for medical management of chronic issues.  Diagnoses and all orders for this visit:  Diabetic nephropathy associated with type 2 diabetes mellitus ([place]) -     CMP14+EGFR -     Bayer Protection Hb A1c Waived  Essential (primary) hypertension  Hypothyroidism, unspecified type  B12 deficiency  Rheumatoid arthritis involving multiple sites with positive rheumatoid factor (HCC)  Other orders -     terbinafine (LAMISIL) 250 MG tablet; Take 1 tablet (250  mg total) by mouth daily.   I am having [facility] start on terbinafine. I am also having her maintain her [place] KIT, Cyanocobalamin, levothyroxine, triamcinolone cream, Multiple Minerals-Vitamins (CALCIUM-MAGNESIUM-ZINC-D3 PO), predniSONE, glucose blood, raloxifene, FLINSTONES GUMMIES OMEGA-3 DHA, vitamin C, atorvastatin, lisinopril, metFORMIN, and omeprazole. We administered cyanocobalamin and cyanocobalamin. We will continue to administer cyanocobalamin and cyanocobalamin.  Meds ordered this encounter  Medications  . terbinafine (LAMISIL) 250 MG tablet    Sig: Take 1 tablet (250 mg total) by mouth daily.    Dispense:  42 tablet    Refill:  0     Follow-up: Return in about 3 months (around 11/23/2017) for diabetes.  Claretta Fraise, M.D.

## 2017-08-24 LAB — CMP14+EGFR
A/G RATIO: 1.9 (ref 1.2–2.2)
ALBUMIN: 4 g/dL (ref 3.5–4.8)
ALK PHOS: 70 IU/L (ref 39–117)
ALT: 13 IU/L (ref 0–32)
AST: 15 IU/L (ref 0–40)
BUN / CREAT RATIO: 20 (ref 12–28)
BUN: 29 mg/dL — ABNORMAL HIGH (ref 8–27)
Bilirubin Total: 0.2 mg/dL (ref 0.0–1.2)
CO2: 18 mmol/L — AB (ref 20–29)
CREATININE: 1.47 mg/dL — AB (ref 0.57–1.00)
Calcium: 8.7 mg/dL (ref 8.7–10.3)
Chloride: 110 mmol/L — ABNORMAL HIGH (ref 96–106)
GFR calc Af Amer: 41 mL/min/{1.73_m2} — ABNORMAL LOW (ref >59)
GFR calc non Af Amer: 35 mL/min/{1.73_m2} — ABNORMAL LOW (ref >59)
GLOBULIN, TOTAL: 2.1 g/dL (ref 1.5–4.5)
Glucose: 80 mg/dL (ref 65–99)
Potassium: 4.7 mmol/L (ref 3.5–5.2)
SODIUM: 143 mmol/L (ref 134–144)
Total Protein: 6.1 g/dL (ref 6.0–8.5)

## 2017-09-26 ENCOUNTER — Ambulatory Visit (INDEPENDENT_AMBULATORY_CARE_PROVIDER_SITE_OTHER): Payer: Medicare Other | Admitting: *Deleted

## 2017-09-26 DIAGNOSIS — E538 Deficiency of other specified B group vitamins: Secondary | ICD-10-CM | POA: Diagnosis not present

## 2017-09-26 NOTE — Patient Instructions (Signed)
Cyanocobalamin, Vitamin B12 injection What is this medicine? CYANOCOBALAMIN (sye an oh koe BAL a min) is a man made form of vitamin B12. Vitamin B12 is used in the growth of healthy blood cells, nerve cells, and proteins in the body. It also helps with the metabolism of fats and carbohydrates. This medicine is used to treat people who can not absorb vitamin B12. This medicine may be used for other purposes; ask your health care provider or pharmacist if you have questions. COMMON BRAND NAME(S): B-12 Compliance Kit, B-12 Injection Kit, Cyomin, LA-12, Nutri-Twelve, Physicians EZ Use B-12, Primabalt What should I tell my health care provider before I take this medicine? They need to know if you have any of these conditions: -kidney disease -Leber's disease -megaloblastic anemia -an unusual or allergic reaction to cyanocobalamin, cobalt, other medicines, foods, dyes, or preservatives -pregnant or trying to get pregnant -breast-feeding How should I use this medicine? This medicine is injected into a muscle or deeply under the skin. It is usually given by a health care professional in a clinic or doctor's office. However, your doctor may teach you how to inject yourself. Follow all instructions. Talk to your pediatrician regarding the use of this medicine in children. Special care may be needed. Overdosage: If you think you have taken too much of this medicine contact a poison control center or emergency room at once. NOTE: This medicine is only for you. Do not share this medicine with others. What if I miss a dose? If you are given your dose at a clinic or doctor's office, call to reschedule your appointment. If you give your own injections and you miss a dose, take it as soon as you can. If it is almost time for your next dose, take only that dose. Do not take double or extra doses. What may interact with this medicine? -colchicine -heavy alcohol intake This list may not describe all possible  interactions. Give your health care provider a list of all the medicines, herbs, non-prescription drugs, or dietary supplements you use. Also tell them if you smoke, drink alcohol, or use illegal drugs. Some items may interact with your medicine. What should I watch for while using this medicine? Visit your doctor or health care professional regularly. You may need blood work done while you are taking this medicine. You may need to follow a special diet. Talk to your doctor. Limit your alcohol intake and avoid smoking to get the best benefit. What side effects may I notice from receiving this medicine? Side effects that you should report to your doctor or health care professional as soon as possible: -allergic reactions like skin rash, itching or hives, swelling of the face, lips, or tongue -blue tint to skin -chest tightness, pain -difficulty breathing, wheezing -dizziness -red, swollen painful area on the leg Side effects that usually do not require medical attention (report to your doctor or health care professional if they continue or are bothersome): -diarrhea -headache This list may not describe all possible side effects. Call your doctor for medical advice about side effects. You may report side effects to FDA at 1-800-FDA-1088. Where should I keep my medicine? Keep out of the reach of children. Store at room temperature between 15 and 30 degrees C (59 and 85 degrees F). Protect from light. Throw away any unused medicine after the expiration date. NOTE: This sheet is a summary. It may not cover all possible information. If you have questions about this medicine, talk to your doctor, pharmacist, or   health care provider.  2018 Elsevier/Gold Standard (2007-07-09 22:10:20)  

## 2017-09-26 NOTE — Progress Notes (Signed)
B12 injection given Tolerated well  

## 2017-10-03 ENCOUNTER — Other Ambulatory Visit: Payer: Self-pay | Admitting: Family Medicine

## 2017-11-03 ENCOUNTER — Ambulatory Visit (INDEPENDENT_AMBULATORY_CARE_PROVIDER_SITE_OTHER): Payer: Medicare Other | Admitting: *Deleted

## 2017-11-03 DIAGNOSIS — E538 Deficiency of other specified B group vitamins: Secondary | ICD-10-CM

## 2017-11-03 NOTE — Progress Notes (Signed)
Pt given Cyanocobalamin inj Tolerated well 

## 2017-11-17 ENCOUNTER — Ambulatory Visit: Payer: Medicare Other | Admitting: Family

## 2017-11-17 ENCOUNTER — Encounter: Payer: Self-pay | Admitting: Family

## 2017-11-17 ENCOUNTER — Ambulatory Visit (INDEPENDENT_AMBULATORY_CARE_PROVIDER_SITE_OTHER): Payer: Medicare Other

## 2017-11-17 VITALS — BP 124/81 | HR 86 | Temp 97.0°F | Ht 64.25 in | Wt 184.0 lb

## 2017-11-17 DIAGNOSIS — E669 Obesity, unspecified: Secondary | ICD-10-CM

## 2017-11-17 DIAGNOSIS — M353 Polymyalgia rheumatica: Secondary | ICD-10-CM

## 2017-11-17 DIAGNOSIS — M316 Other giant cell arteritis: Secondary | ICD-10-CM

## 2017-11-17 DIAGNOSIS — G44201 Tension-type headache, unspecified, intractable: Secondary | ICD-10-CM

## 2017-11-17 MED ORDER — METHYLPREDNISOLONE ACETATE 80 MG/ML IJ SUSP
80.0000 mg | Freq: Once | INTRAMUSCULAR | Status: AC
  Administered 2017-11-17: 80 mg via INTRAMUSCULAR

## 2017-11-17 NOTE — Progress Notes (Signed)
Subjective:    [name], female    DOB: March 04, 1946, 72 y.o.   MRN: [id number]  Chief Complaint  Patient presents with  . Headache    nausea   PT presents to the office today with new headache. She states she has polymyalgia rheumatica and is followed by rheumatologists every 6 moths. She has an appt at the end of this month. She states over the last few months she has had increase in her left neck pain, but recently has become more constant. She is complaining of aching pain of 6 out 10. She believes  that the pain in her neck is causing her headache. She called her rheumatologist this morning and was told to increase her prednisone to  5 mg from 4 mg. However, since it is Friday she was worried her headache would get worse and would not be able to see anyone. Headache   This is a new problem. The current episode started in the past 7 days. The problem occurs constantly. The problem has been unchanged. The pain is located in the left unilateral region. The pain radiates to the left neck. The pain quality is not similar to prior headaches. The quality of the pain is described as aching. The pain is at a severity of 5/10. Associated symptoms include nausea, neck pain, photophobia and weakness. Pertinent negatives include no blurred vision, hearing loss, loss of balance, phonophobia or visual change. Nothing aggravates the symptoms. Treatments tried: prednsione  The treatment provided mild relief.      Review of Systems  HENT: Negative for hearing loss.   Eyes: Positive for photophobia. Negative for blurred vision.  Gastrointestinal: Positive for nausea.  Musculoskeletal: Positive for neck pain.  Neurological: Positive for weakness and headaches. Negative for loss of balance.  All other systems reviewed and are negative.      Objective:   Physical Exam  Constitutional: She is oriented to person, place, and time. She appears well-developed and well-nourished. No distress.    HENT:  Right Ear: External ear normal.  Neck: No thyromegaly present.  Pain in left posterior neck with flexion, extension and rotation   Cardiovascular: Normal rate, regular rhythm, normal heart sounds and intact distal pulses.  No murmur heard. Pulmonary/Chest: Effort normal and breath sounds normal. No respiratory distress. She has no wheezes.  Abdominal: Soft. Bowel sounds are normal. She exhibits no distension. There is no tenderness.  Musculoskeletal: Normal range of motion. She exhibits no edema or tenderness.  Neurological: She is alert and oriented to person, place, and time. She has normal reflexes. No cranial nerve deficit.  Skin: Skin is warm and dry.  Psychiatric: She has a normal mood and affect. Her behavior is normal. Judgment and thought content normal.  Vitals reviewed.     BP 124/81   Pulse 86   Temp (!) 97 F (36.1 C) (Oral)   Ht 5' 4.25" (1.632 m)   Wt 184 lb (83.5 kg)   LMP 04/11/1986   BMI 31.34 kg/m      Assessment & Plan:  Erin Ortiz comes in today with chief complaint of Headache (nausea)   Diagnosis and orders addressed:  1. Anarthritic rheumatoid disease (HCC) - DG Cervical Spine Complete; Future - methylPREDNISolone acetate (DEPO-MEDROL) injection 80 mg  2. Obesity (BMI 30-39.9) - DG Cervical Spine Complete; Future - methylPREDNISolone acetate (DEPO-MEDROL) injection 80 mg  3. Acute intractable tension-type headache - DG Cervical Spine Complete; Future - methylPREDNISolone acetate (DEPO-MEDROL) injection 80 mg  4. Polymyalgia rheumatica (HCC) - methylPREDNISolone acetate (DEPO-MEDROL) injection 80 mg   Keep Rheumatologists appt No changes in gait, speech, or vision. If any of these go to ED.  Stress management  RTO if pain worsen or does not improve   Jannifer Rodney, FNP

## 2017-11-17 NOTE — Patient Instructions (Signed)
Headache and Arthritis If you have arthritis and headaches, it is possible the two problems are related. Some headaches can be caused by arthritis in your neck (cervicogenic headaches). Pain medicine is another possible link between arthritis and headache. If you take a lot of over-the-counter medicines for arthritis pain, you may develop the type of headache that can happen when you stop taking your over-the-counter pain reliever or lower your dose too quickly (rebound headache). What types of arthritis can cause a headache? There are two types of arthritis, rheumatoid arthritis and osteoarthritis. Both types of arthritis can cause headaches.  Rheumatoid arthritis (RA) is an autoimmune disease that causes inflammation of your joints. When you have RA, your body's defense system (immune system) attacks the joints of your body and causes inflammation. This can lead to deformity over time.  Osteoarthritis (OA) is wear and tear caused by joint use over time. Osteoarthritis is not an inflammatory disease.  Both OA and RA can cause neck pain that is felt in the head. When the pain is felt in a different location than it originates, it is called radiating or referred pain. This pain is usually felt in the back of the head. How are headaches and arthritis related? RA can affect any joint in the body, including the joints between the bones of the neck (cervical vertebrae). The neck joints most commonly affected by RA are the top two joints, between the first and second cervical vertebra. Inflammation in these joints may be felt as neck pain and head pain. OA of the neck may be caused by gradual wear and tear or by a neck injury. Neck vertebrae may develop calcium deposits in the areas where muscle attach. Wear and tear of the vertebra may cause pressure on the nerves that leave the spinal cord. These changes can cause referred pain that may be felt as a headache. How are headaches associated with arthritis  diagnosed?  Your health care provider may diagnose headache caused by RA if you have inflammation of vertebrae in your neck. You may have: ? Blood tests to measure how much inflammation you have. ? Imaging studies of your neck (MRI) to check for inflammation of cervical vertebrae.  Your health care provider may diagnose headache caused by OA if an X-ray shows: ? Calcium deposits. ? Bone spurs. ? Narrowing of the space between neck vertebrae.  Your health care provider may diagnose rebound headache if you have a history of using over-the-counter pain relievers frequently. When should I seek care for my headaches? Call your health care provider if:  You have more than three headaches per week.  You take an over-the-counter pain reliever almost every day.  Your headaches are getting worse and happening more often.  You have headache with fever.  You have headache with numbness, weakness, or dizziness.  You have headache with nausea or vomiting.  What are my treatment options?  If you have headache caused by RA, treatment may include: ? Over-the-counter or prescription-strength anti-inflammatory medicines. ? Disease-modifying antirheumatic drugs (DMARDs). These medicines slow or stop the progression of RA.  If you have headache caused by OA, treatment may include: ? Over-the-counter pain medicines. ? Heat or massage. ? Physical therapy.  If you have rebound headaches: ? They will usually go away within several days of stopping the medicine that caused them. ? You may be able to gradually reduce the amount of medicines you take to prevent headache. ? Ask your health care provider if you can take   another type of medicine instead. This information is not intended to replace advice given to you by your health care provider. Make sure you discuss any questions you have with your health care provider. Document Released: 06/18/2003 Document Revised: 09/03/2015 Document Reviewed:  07/01/2013 Elsevier Interactive Patient Education  2018 [employer] Inc[employer]  

## 2017-11-24 ENCOUNTER — Ambulatory Visit: Payer: Medicare Other | Admitting: Family Medicine

## 2017-11-24 ENCOUNTER — Encounter: Payer: Self-pay | Admitting: Family Medicine

## 2017-11-24 VITALS — BP 95/63 | HR 74 | Temp 97.3°F | Ht 64.25 in | Wt 183.0 lb

## 2017-11-24 DIAGNOSIS — M4722 Other spondylosis with radiculopathy, cervical region: Secondary | ICD-10-CM

## 2017-11-24 DIAGNOSIS — E538 Deficiency of other specified B group vitamins: Secondary | ICD-10-CM

## 2017-11-24 DIAGNOSIS — M353 Polymyalgia rheumatica: Secondary | ICD-10-CM

## 2017-11-24 DIAGNOSIS — E1121 Type 2 diabetes mellitus with diabetic nephropathy: Secondary | ICD-10-CM

## 2017-11-24 DIAGNOSIS — E782 Mixed hyperlipidemia: Secondary | ICD-10-CM | POA: Diagnosis not present

## 2017-11-24 LAB — BAYER DCA HB A1C WAIVED: HB A1C (BAYER DCA - WAIVED): 6.1 % (ref <7.0)

## 2017-11-24 MED ORDER — PREDNISONE 5 MG PO TABS: ORAL_TABLET | ORAL | 0 refills | Status: DC

## 2017-11-24 MED ORDER — LEVOTHYROXINE SODIUM 112 MCG PO TABS: ORAL_TABLET | ORAL | 1 refills | Status: DC

## 2017-11-24 MED ORDER — OMEPRAZOLE 40 MG PO CPDR: 40.0000 mg | DELAYED_RELEASE_CAPSULE | Freq: Every day | ORAL | 1 refills | Status: DC

## 2017-11-24 MED ORDER — LISINOPRIL 20 MG PO TABS: 20.0000 mg | ORAL_TABLET | Freq: Two times a day (BID) | ORAL | 1 refills | Status: DC

## 2017-11-24 MED ORDER — ATORVASTATIN CALCIUM 40 MG PO TABS: 40.0000 mg | ORAL_TABLET | Freq: Every day | ORAL | 1 refills | Status: DC

## 2017-11-24 MED ORDER — RALOXIFENE HCL 60 MG PO TABS: 60.0000 mg | ORAL_TABLET | Freq: Every day | ORAL | 4 refills | Status: DC

## 2017-11-24 MED ORDER — METFORMIN HCL ER 750 MG PO TB24: 750.0000 mg | ORAL_TABLET | Freq: Two times a day (BID) | ORAL | 1 refills | Status: DC

## 2017-11-24 NOTE — Patient Instructions (Signed)

## 2017-11-24 NOTE — Progress Notes (Signed)
Subjective:  [name], female    DOB: January 12, 1946  Age: 72 y.o. MRN: [id number]  CC: Medical Management of Chronic Issues   HPI Erin Ortiz presents for Follow-up of diabetes. Patient checks blood sugar at home.  Increased to 150's fasting after recent steroid injection.  Patient denies symptoms such as polyuria, polydipsia, excessive hunger, nausea No significant hypoglycemic spells noted. Medications reviewed. Pt reports taking them regularly without complication/adverse reaction being reported today.  Checking feet daily. The steroid injection, DepoMedrol, was administered due to severe neck pain.  She had an x-ray last week when she saw Dr. Georgiann Cocker who noted that she had a bone spur.  The patient says this radiates to her left hand and causes it to feel jumpy and tingly.  She has similar symptoms in the right foot.  It has improved significantly over the last week but pain is still in the 4/10 range.  The headache has remitted significantly.  The pain in the left hand has resolved but some in the right foot continues.  History Erin Ortiz has a past medical history of Anemia, Chronic kidney disease, and Diabetes mellitus without complication ([place]).   She has a past surgical history that includes jejunostomy (05/17/2008); Gastric bypass (11/2001); and Vaginal hysterectomy (12/1986).   Her family history includes Cancer in her father and mother.She reports that she has never smoked. She has never used smokeless tobacco. She reports that she does not drink alcohol or use drugs.  Current Outpatient Medications on File Prior to Visit  Medication Sig Dispense Refill  . Blood Glucose Monitoring Suppl (ONE TOUCH ULTRA SYSTEM KIT) W/DEVICE KIT 1 kit by Does not apply route once. 1 each 0  . Cyanocobalamin 1000 MCG/ML KIT Inject 1,200 mcg as directed every 30 (thirty) days. 1 kit 6  . glucose blood (ONE TOUCH ULTRA TEST) test strip USE ONE NEW STRIP TO CHECK GLUCOSE THREE TIMES DAILY  100 each 11  . Multiple Minerals-Vitamins (CALCIUM-MAGNESIUM-ZINC-D3 PO) Take by mouth 3 (three) times daily.    . Pediatric Multiple Vit-C-FA (FLINSTONES GUMMIES OMEGA-3 DHA) CHEW Chew by mouth.    . predniSONE (DELTASONE) 1 MG tablet TAKE FOUR TABLETS BY MOUTH DAILY    . terbinafine (LAMISIL) 250 MG tablet Take 1 tablet (250 mg total) by mouth daily. 42 tablet 0  . triamcinolone cream (KENALOG) 0.1 % Apply 1 application topically 3 (three) times daily. Avoid face and genitalia 45 g 0  . vitamin C (ASCORBIC ACID) 500 MG tablet Take 500 mg by mouth daily.     Current Facility-Administered Medications on File Prior to Visit  Medication Dose Route Frequency Provider Last Rate Last Dose  . cyanocobalamin ((VITAMIN B-12)) injection 1,000 mcg  1,000 mcg Intramuscular Q30 days Claretta Fraise, MD   1,000 mcg at 11/24/17 0907  . cyanocobalamin ((VITAMIN B-12)) injection 200 mcg  200 mcg Intramuscular Q30 days Claretta Fraise, MD   200 mcg at 11/24/17 0907    ROS Review of Systems  Constitutional: Negative.   HENT: Negative for congestion.   Eyes: Negative for visual disturbance.  Respiratory: Negative for shortness of breath.   Cardiovascular: Negative for chest pain.  Gastrointestinal: Negative for abdominal pain, constipation, diarrhea, nausea and vomiting.  Genitourinary: Negative for difficulty urinating.  Musculoskeletal: Positive for arthralgias, myalgias and neck pain.  Neurological: Negative for headaches.  Psychiatric/Behavioral: Negative for sleep disturbance.    Objective:  BP 95/63   Pulse 74   Temp (!) 97.3 F (36.3 C) (Oral)  Ht 5' 4.25" (1.632 m)   Wt 183 lb (83 kg)   LMP 04/11/1986   BMI 31.17 kg/m   BP Readings from Last 3 Encounters:  11/24/17 95/63  11/17/17 124/81  08/23/17 103/64    Wt Readings from Last 3 Encounters:  11/24/17 183 lb (83 kg)  11/17/17 184 lb (83.5 kg)  08/23/17 184 lb 8 oz (83.7 kg)     Physical Exam  Constitutional: She is oriented  to person, place, and time. She appears well-developed and well-nourished. No distress.  HENT:  Head: Normocephalic and atraumatic.  Right Ear: External ear normal.  Left Ear: External ear normal.  Nose: Nose normal.  Mouth/Throat: Oropharynx is clear and moist.  Eyes: Pupils are equal, round, and reactive to light. Conjunctivae and EOM are normal.  Neck: Normal range of motion. Neck supple. No thyromegaly present.  Cardiovascular: Normal rate, regular rhythm and normal heart sounds.  No murmur heard. Pulmonary/Chest: Effort normal and breath sounds normal. No respiratory distress. She has no wheezes. She has no rales.  Abdominal: Soft. She exhibits no distension. There is no tenderness.  Lymphadenopathy:    She has no cervical adenopathy.  Neurological: She is alert and oriented to person, place, and time. She has normal reflexes.  Skin: Skin is warm and dry.  Psychiatric: She has a normal mood and affect. Her behavior is normal. Judgment and thought content normal.   [facility] XR reviewed - spur at C3-4 with encroachment into the neuro foramina   Assessment & Plan:   Erin Ortiz was seen today for medical management of chronic issues.  Diagnoses and all orders for this visit:  Diabetic nephropathy associated with type 2 diabetes mellitus ([place]) -     Bayer DCA Hb A1c Waived  Mixed hyperlipidemia -     CBC with Differential/Platelet -     CMP14+EGFR -     Lipid panel  Polymyalgia rheumatica (HCC)  Osteoarthritis of spine with radiculopathy, cervical region  Other orders -     atorvastatin (LIPITOR) 40 MG tablet; Take 1 tablet (40 mg total) by mouth daily. -     levothyroxine (SYNTHROID, LEVOTHROID) 112 MCG tablet; TAKE 1 TABLET BY MOUTH ONCE DAILY BEFORE  BREAKFAST -     lisinopril (PRINIVIL,ZESTRIL) 20 MG tablet; Take 1 tablet (20 mg total) by mouth 2 (two) times daily. -     metFORMIN (GLUCOPHAGE-XR) 750 MG 24 hr tablet; Take 1 tablet (750 mg total) by mouth 2 (two) times  daily. -     omeprazole (PRILOSEC) 40 MG capsule; Take 1 capsule (40 mg total) by mouth daily. -     raloxifene (EVISTA) 60 MG tablet; Take 1 tablet (60 mg total) by mouth daily. -     predniSONE (DELTASONE) 5 MG tablet; Take 4 PO x7days, then 3 PO x7days, then 2PO x7days,then 1 PO daily for 7 days.      I am having Erin Ortiz start on predniSONE. I am also having her maintain her [place] KIT, Cyanocobalamin, triamcinolone cream, Multiple Minerals-Vitamins (CALCIUM-MAGNESIUM-ZINC-D3 PO), predniSONE, glucose blood, FLINSTONES GUMMIES OMEGA-3 DHA, vitamin C, terbinafine, atorvastatin, levothyroxine, lisinopril, metFORMIN, omeprazole, and raloxifene. We administered cyanocobalamin and cyanocobalamin. We will continue to administer cyanocobalamin and cyanocobalamin.  Meds ordered this encounter  Medications  . atorvastatin (LIPITOR) 40 MG tablet    Sig: Take 1 tablet (40 mg total) by mouth daily.    Dispense:  90 tablet    Refill:  1  . levothyroxine (SYNTHROID, LEVOTHROID) 112  MCG tablet    Sig: TAKE 1 TABLET BY MOUTH ONCE DAILY BEFORE  BREAKFAST    Dispense:  90 tablet    Refill:  1  . lisinopril (PRINIVIL,ZESTRIL) 20 MG tablet    Sig: Take 1 tablet (20 mg total) by mouth 2 (two) times daily.    Dispense:  180 tablet    Refill:  1  . metFORMIN (GLUCOPHAGE-XR) 750 MG 24 hr tablet    Sig: Take 1 tablet (750 mg total) by mouth 2 (two) times daily.    Dispense:  180 tablet    Refill:  1  . omeprazole (PRILOSEC) 40 MG capsule    Sig: Take 1 capsule (40 mg total) by mouth daily.    Dispense:  90 capsule    Refill:  1  . raloxifene (EVISTA) 60 MG tablet    Sig: Take 1 tablet (60 mg total) by mouth daily.    Dispense:  90 tablet    Refill:  4  . predniSONE (DELTASONE) 5 MG tablet    Sig: Take 4 PO x7days, then 3 PO x7days, then 2PO x7days,then 1 PO daily for 7 days.    Dispense:  70 tablet    Refill:  0     Follow-up: Return in about 3 months (around  02/24/2018).  Claretta Fraise, M.D.

## 2017-11-25 LAB — CMP14+EGFR
ALT: 16 IU/L (ref 0–32)
AST: 17 IU/L (ref 0–40)
Albumin/Globulin Ratio: 1.7 (ref 1.2–2.2)
Albumin: 3.9 g/dL (ref 3.5–4.8)
Alkaline Phosphatase: 69 IU/L (ref 39–117)
BUN/Creatinine Ratio: 16 (ref 12–28)
BUN: 25 mg/dL (ref 8–27)
Bilirubin Total: 0.3 mg/dL (ref 0.0–1.2)
CALCIUM: 8.9 mg/dL (ref 8.7–10.3)
CO2: 19 mmol/L — AB (ref 20–29)
CREATININE: 1.55 mg/dL — AB (ref 0.57–1.00)
Chloride: 110 mmol/L — ABNORMAL HIGH (ref 96–106)
GFR calc Af Amer: 38 mL/min/{1.73_m2} — ABNORMAL LOW (ref >59)
GFR, EST NON AFRICAN AMERICAN: 33 mL/min/{1.73_m2} — AB (ref >59)
GLUCOSE: 108 mg/dL — AB (ref 65–99)
Globulin, Total: 2.3 g/dL (ref 1.5–4.5)
Potassium: 4.3 mmol/L (ref 3.5–5.2)
Sodium: 140 mmol/L (ref 134–144)
Total Protein: 6.2 g/dL (ref 6.0–8.5)

## 2017-11-25 LAB — CBC WITH DIFFERENTIAL/PLATELET
BASOS ABS: 0 10*3/uL (ref 0.0–0.2)
Basos: 1 %
EOS (ABSOLUTE): 0.1 10*3/uL (ref 0.0–0.4)
EOS: 2 %
HEMATOCRIT: 34 % (ref 34.0–46.6)
Hemoglobin: 10.7 g/dL — ABNORMAL LOW (ref 11.1–15.9)
IMMATURE GRANULOCYTES: 0 %
Immature Grans (Abs): 0 10*3/uL (ref 0.0–0.1)
Lymphocytes Absolute: 1.5 10*3/uL (ref 0.7–3.1)
Lymphs: 20 %
MCH: 29.6 pg (ref 26.6–33.0)
MCHC: 31.5 g/dL (ref 31.5–35.7)
MCV: 94 fL (ref 79–97)
MONOS ABS: 0.6 10*3/uL (ref 0.1–0.9)
Monocytes: 8 %
NEUTROS PCT: 69 %
Neutrophils Absolute: 5.2 10*3/uL (ref 1.4–7.0)
PLATELETS: 219 10*3/uL (ref 150–450)
RBC: 3.61 x10E6/uL — AB (ref 3.77–5.28)
RDW: 14.3 % (ref 12.3–15.4)
WBC: 7.5 10*3/uL (ref 3.4–10.8)

## 2017-11-25 LAB — LIPID PANEL
CHOL/HDL RATIO: 2.5 ratio (ref 0.0–4.4)
CHOLESTEROL TOTAL: 134 mg/dL (ref 100–199)
HDL: 53 mg/dL (ref >39)
LDL CALC: 62 mg/dL (ref 0–99)
Triglycerides: 94 mg/dL (ref 0–149)
VLDL Cholesterol Cal: 19 mg/dL (ref 5–40)

## 2017-12-04 LAB — HM MAMMOGRAPHY

## 2017-12-07 ENCOUNTER — Other Ambulatory Visit: Payer: Self-pay | Admitting: Family Medicine

## 2017-12-07 DIAGNOSIS — Z1239 Encounter for other screening for malignant neoplasm of breast: Secondary | ICD-10-CM

## 2017-12-25 ENCOUNTER — Encounter: Payer: Self-pay | Admitting: Family Medicine

## 2017-12-25 ENCOUNTER — Ambulatory Visit (INDEPENDENT_AMBULATORY_CARE_PROVIDER_SITE_OTHER): Payer: Medicare Other | Admitting: Family Medicine

## 2017-12-25 VITALS — BP 115/71 | HR 73 | Temp 96.7°F | Ht 64.25 in | Wt 187.1 lb

## 2017-12-25 DIAGNOSIS — M353 Polymyalgia rheumatica: Secondary | ICD-10-CM

## 2017-12-25 DIAGNOSIS — E1121 Type 2 diabetes mellitus with diabetic nephropathy: Secondary | ICD-10-CM

## 2017-12-25 DIAGNOSIS — E538 Deficiency of other specified B group vitamins: Secondary | ICD-10-CM | POA: Diagnosis not present

## 2017-12-25 MED ORDER — SITAGLIPTIN PHOSPHATE 50 MG PO TABS: 50.0000 mg | ORAL_TABLET | Freq: Every day | ORAL | 2 refills | Status: DC

## 2017-12-25 MED ORDER — PREDNISONE 10 MG PO TABS: 10.0000 mg | ORAL_TABLET | Freq: Every day | ORAL | 1 refills | Status: DC

## 2017-12-25 NOTE — Progress Notes (Signed)
Subjective:  [name], female    DOB: Oct 02, 1945  Age: 72 y.o. MRN: [id number]  CC: Neck Pain (pt here today following up on neck pain which got better with the higher dose Prednisone but when she got to the lower dose it "flared back up again")   HPI Erin Ortiz presents for recheck a recent flare of polymyalgia.  She did not get good relief from the prednisone for the first couple weeks but starting in the third week she started getting much better relief.  When she tapered down past 10 mg though she started having recurrent pain.  Blood sugars did rebound higher during the higher doses of the prednisone as well with some clawing of 200 which is rare for this patient.  No hypoglycemic spells were noted.  Depression screen [facility] 2/9 12/25/2017 11/17/2017 08/23/2017  Decreased Interest 0 0 0  Down, Depressed, Hopeless 0 0 0  PHQ - 2 Score 0 0 0    History Erin Ortiz has a past medical history of Anemia, Chronic kidney disease, and Diabetes mellitus without complication ([place]).   She has a past surgical history that includes jejunostomy (05/17/2008); Gastric bypass (11/2001); and Vaginal hysterectomy (12/1986).   Her family history includes Cancer in her father and mother.She reports that she has never smoked. She has never used smokeless tobacco. She reports that she does not drink alcohol or use drugs.    ROS Review of Systems  Constitutional: Negative.   HENT: Negative.   Eyes: Negative for visual disturbance.  Respiratory: Negative for shortness of breath.   Cardiovascular: Negative for chest pain.  Gastrointestinal: Negative for abdominal pain.  Musculoskeletal: Positive for arthralgias and joint swelling (Hands and shoulders).    Objective:  BP 115/71   Pulse 73   Temp (!) 96.7 F (35.9 C) (Oral)   Ht 5' 4.25" (1.632 m)   Wt 187 lb 2 oz (84.9 kg)   LMP 04/11/1986   BMI 31.87 kg/m   BP Readings from Last 3 Encounters:  12/25/17 115/71  11/24/17 95/63    11/17/17 124/81    Wt Readings from Last 3 Encounters:  12/25/17 187 lb 2 oz (84.9 kg)  11/24/17 183 lb (83 kg)  11/17/17 184 lb (83.5 kg)     Physical Exam  Constitutional: She is oriented to person, place, and time. She appears well-developed and well-nourished. No distress.  Cardiovascular: Normal rate and regular rhythm.  Pulmonary/Chest: Breath sounds normal.  Musculoskeletal: She exhibits tenderness (at IP joints, for ROM left shoulder).  Neurological: She is alert and oriented to person, place, and time.  Skin: Skin is warm and dry.  Psychiatric: She has a normal mood and affect.      Assessment & Plan:   Erin Ortiz was seen today for neck pain.  Diagnoses and all orders for this visit:  Polymyalgia rheumatica ([place])  Diabetic nephropathy associated with type 2 diabetes mellitus ([place])  Other orders -     predniSONE (DELTASONE) 10 MG tablet; Take 1 tablet (10 mg total) by mouth daily with breakfast. -     sitaGLIPtin (JANUVIA) 50 MG tablet; Take 1 tablet (50 mg total) by mouth daily.       I am having Erin Ortiz start on predniSONE and sitaGLIPtin. I am also having her maintain her [place] KIT, Cyanocobalamin, triamcinolone cream, Multiple Minerals-Vitamins (CALCIUM-MAGNESIUM-ZINC-D3 PO), predniSONE, glucose blood, FLINSTONES GUMMIES OMEGA-3 DHA, vitamin C, terbinafine, atorvastatin, levothyroxine, lisinopril, metFORMIN, omeprazole, and raloxifene. We administered cyanocobalamin and  cyanocobalamin. We will continue to administer cyanocobalamin and cyanocobalamin.  Allergies as of 12/25/2017   No Known Allergies     Medication List        Accurate as of 12/25/17  9:42 PM. Always use your most recent med list.          atorvastatin 40 MG tablet Commonly known as:  LIPITOR Take 1 tablet (40 mg total) by mouth daily.   CALCIUM-MAGNESIUM-ZINC-D3 PO Take by mouth 3 (three) times daily.   Cyanocobalamin 1000 MCG/ML Kit Inject 1,200 mcg as  directed every 30 (thirty) days.   FLINSTONES GUMMIES OMEGA-3 DHA Chew Chew by mouth.   glucose blood test strip USE ONE NEW STRIP TO CHECK GLUCOSE THREE TIMES DAILY   levothyroxine 112 MCG tablet Commonly known as:  SYNTHROID, LEVOTHROID TAKE 1 TABLET BY MOUTH ONCE DAILY BEFORE  BREAKFAST   lisinopril 20 MG tablet Commonly known as:  PRINIVIL,ZESTRIL Take 1 tablet (20 mg total) by mouth 2 (two) times daily.   metFORMIN 750 MG 24 hr tablet Commonly known as:  GLUCOPHAGE-XR Take 1 tablet (750 mg total) by mouth 2 (two) times daily.   omeprazole 40 MG capsule Commonly known as:  PRILOSEC Take 1 capsule (40 mg total) by mouth daily.   ONE TOUCH ULTRA SYSTEM KIT w/Device Kit 1 kit by Does not apply route once.   predniSONE 1 MG tablet Commonly known as:  DELTASONE TAKE FOUR TABLETS BY MOUTH DAILY   predniSONE 10 MG tablet Commonly known as:  DELTASONE Take 1 tablet (10 mg total) by mouth daily with breakfast.   raloxifene 60 MG tablet Commonly known as:  EVISTA Take 1 tablet (60 mg total) by mouth daily.   sitaGLIPtin 50 MG tablet Commonly known as:  JANUVIA Take 1 tablet (50 mg total) by mouth daily.   terbinafine 250 MG tablet Commonly known as:  LAMISIL Take 1 tablet (250 mg total) by mouth daily.   triamcinolone cream 0.1 % Commonly known as:  KENALOG Apply 1 application topically 3 (three) times daily. Avoid face and genitalia   vitamin C 500 MG tablet Commonly known as:  ASCORBIC ACID Take 500 mg by mouth daily.        Follow-up: Return in about 6 weeks (around 02/05/2018).  Claretta Fraise, M.D.

## 2018-01-24 ENCOUNTER — Ambulatory Visit: Payer: Medicare Other | Admitting: Family Medicine

## 2018-01-24 ENCOUNTER — Encounter: Payer: Self-pay | Admitting: Family Medicine

## 2018-01-24 VITALS — BP 125/77 | HR 74 | Temp 97.1°F | Ht 64.25 in | Wt 181.0 lb

## 2018-01-24 DIAGNOSIS — E538 Deficiency of other specified B group vitamins: Secondary | ICD-10-CM | POA: Diagnosis not present

## 2018-01-24 DIAGNOSIS — E1121 Type 2 diabetes mellitus with diabetic nephropathy: Secondary | ICD-10-CM | POA: Diagnosis not present

## 2018-01-24 DIAGNOSIS — M353 Polymyalgia rheumatica: Secondary | ICD-10-CM

## 2018-01-24 DIAGNOSIS — M542 Cervicalgia: Secondary | ICD-10-CM

## 2018-01-24 MED ORDER — PREDNISONE 1 MG PO TABS: 4.0000 mg | ORAL_TABLET | Freq: Every day | ORAL | 5 refills | Status: DC

## 2018-01-24 NOTE — Progress Notes (Signed)
Chief Complaint  Patient presents with  . Follow up neck pain    HPI  Patient presents today for recheck of her neck pain.  She has continued to use the prednisone 10 mg and has tapered to 5 mg is now ready to go back to her standard regimen.  She says that anytime she tries to go less than 4 her polymyalgia rheumatica flares up.  Currently she is at her baseline with regard to pain and it is very manageable.  Additionally she says that her blood sugars are doing well with the metformin.  Range is 100-1 50.  She brings in a log sheet showing a significant drop in her blood sugars into that range starting from the time she began taking Januvia.  PMH: Smoking status noted ROS: Review of Systems  Constitutional: Negative.   HENT: Negative.   Eyes: Negative for visual disturbance.  Respiratory: Negative for shortness of breath.   Cardiovascular: Negative for chest pain.  Gastrointestinal: Negative for abdominal pain.  Musculoskeletal: Negative for arthralgias.     Objective: BP 125/77   Pulse 74   Temp (!) 97.1 F (36.2 C) (Oral)   Ht 5' 4.25" (1.632 m)   Wt 181 lb (82.1 kg)   LMP 04/11/1986   BMI 30.83 kg/m  Gen: NAD, alert, cooperative with exam HEENT: NCAT, EOMI, PERRL CV: RRR, good S1/S2, no murmur Resp: CTABL, no wheezes, non-labored Abd: SNTND, BS present, no guarding or organomegaly Ext: No edema, warm Neuro: Alert and oriented, No gross deficits  Assessment and plan:  1. Diabetic nephropathy associated with type 2 diabetes mellitus (HCC)   2. Cervicalgia   3. Polymyalgia rheumatica (HCC)     Meds ordered this encounter  Medications  . predniSONE (DELTASONE) 1 MG tablet    Sig: Take 4 tablets (4 mg total) by mouth daily.    Dispense:  120 tablet    Refill:  5    No orders of the defined types were placed in this encounter.   Follow up as needed.  Mechele Claude, MD

## 2018-01-28 ENCOUNTER — Encounter: Payer: Self-pay | Admitting: Family Medicine

## 2018-02-21 ENCOUNTER — Ambulatory Visit: Payer: Medicare Other | Admitting: Family Medicine

## 2018-02-21 ENCOUNTER — Encounter: Payer: Self-pay | Admitting: Family Medicine

## 2018-02-21 VITALS — BP 114/78 | HR 75 | Temp 96.9°F | Ht 64.0 in | Wt 182.8 lb

## 2018-02-21 DIAGNOSIS — E039 Hypothyroidism, unspecified: Secondary | ICD-10-CM

## 2018-02-21 DIAGNOSIS — E538 Deficiency of other specified B group vitamins: Secondary | ICD-10-CM | POA: Diagnosis not present

## 2018-02-21 DIAGNOSIS — E782 Mixed hyperlipidemia: Secondary | ICD-10-CM

## 2018-02-21 DIAGNOSIS — E1121 Type 2 diabetes mellitus with diabetic nephropathy: Secondary | ICD-10-CM

## 2018-02-21 DIAGNOSIS — M353 Polymyalgia rheumatica: Secondary | ICD-10-CM

## 2018-02-21 DIAGNOSIS — Z23 Encounter for immunization: Secondary | ICD-10-CM

## 2018-02-21 DIAGNOSIS — N63 Unspecified lump in unspecified breast: Secondary | ICD-10-CM

## 2018-02-21 DIAGNOSIS — I10 Essential (primary) hypertension: Secondary | ICD-10-CM | POA: Diagnosis not present

## 2018-02-21 DIAGNOSIS — E559 Vitamin D deficiency, unspecified: Secondary | ICD-10-CM

## 2018-02-21 LAB — BAYER DCA HB A1C WAIVED: HB A1C (BAYER DCA - WAIVED): 6.2 % (ref <7.0)

## 2018-02-21 MED ORDER — RALOXIFENE HCL 60 MG PO TABS: 60.0000 mg | ORAL_TABLET | Freq: Every day | ORAL | 4 refills | Status: DC

## 2018-02-21 MED ORDER — METFORMIN HCL ER 750 MG PO TB24: 750.0000 mg | ORAL_TABLET | Freq: Two times a day (BID) | ORAL | 1 refills | Status: DC

## 2018-02-21 MED ORDER — LEVOTHYROXINE SODIUM 112 MCG PO TABS: ORAL_TABLET | ORAL | 1 refills | Status: DC

## 2018-02-21 MED ORDER — PREDNISONE 1 MG PO TABS: 4.0000 mg | ORAL_TABLET | Freq: Every day | ORAL | 5 refills | Status: DC

## 2018-02-21 MED ORDER — LISINOPRIL 20 MG PO TABS: 20.0000 mg | ORAL_TABLET | Freq: Two times a day (BID) | ORAL | 1 refills | Status: DC

## 2018-02-21 MED ORDER — ATORVASTATIN CALCIUM 40 MG PO TABS: 40.0000 mg | ORAL_TABLET | Freq: Every day | ORAL | 1 refills | Status: DC

## 2018-02-21 MED ORDER — CYANOCOBALAMIN 1000 MCG/ML IJ KIT: 1200.0000 ug | PACK | INTRAMUSCULAR | 6 refills | Status: DC

## 2018-02-21 MED ORDER — TRIAMCINOLONE ACETONIDE 0.1 % EX CREA: 1.0000 "application " | TOPICAL_CREAM | Freq: Three times a day (TID) | CUTANEOUS | 0 refills | Status: DC

## 2018-02-21 MED ORDER — OMEPRAZOLE 40 MG PO CPDR: 40.0000 mg | DELAYED_RELEASE_CAPSULE | Freq: Every day | ORAL | 1 refills | Status: DC

## 2018-02-21 NOTE — Progress Notes (Signed)
Subjective:  [name],  female    DOB: 1946/01/25  Age: 72 y.o.    CC: Medical Management of Chronic Issues   HPI Shamanda Len presents for  follow-up of hypertension. Patient has no history of headache chest pain or shortness of breath or recent cough. Patient also denies symptoms of TIA such as numbness weakness lateralizing. Patient denies side effects from medication. States taking it regularly.  Patient also  in for follow-up of elevated cholesterol. Doing well without complaints on current medication. Denies side effects  including myalgia and arthralgia and nausea. Also in today for liver function testing. Currently no chest pain, shortness of breath or other cardiovascular related symptoms noted.  Follow-up of diabetes. Patient does check blood sugar at home. Readings run between 90 and 160 Patient denies symptoms such as excessive hunger or urinary frequency, excessive hunger, nausea No significant hypoglycemic spells noted. Medications reviewed. Pt reports taking them regularly. Pt. denies complication/adverse reaction today.  Patient relates that although she had a normal mammogram few months ago.  She noted about 1 month ago that she has a piece size nodule at the upper aspect of the left breast. History Keyanna has a past medical history of Anemia, Chronic kidney disease, and Diabetes mellitus without complication ([place]).   She has a past surgical history that includes jejunostomy (05/17/2008); Gastric bypass (11/2001); and Vaginal hysterectomy (12/1986).   Her family history includes Cancer in her father and mother.She reports that she has never smoked. She has never used smokeless tobacco. She reports that she does not drink alcohol or use drugs.  Current Outpatient Medications on File Prior to Visit  Medication Sig Dispense Refill  . Blood Glucose Monitoring Suppl (ONE TOUCH ULTRA SYSTEM KIT) W/DEVICE KIT 1 kit by Does not apply route once. 1 each 0  .  glucose blood (ONE TOUCH ULTRA TEST) test strip USE ONE NEW STRIP TO CHECK GLUCOSE THREE TIMES DAILY 100 each 11  . Multiple Minerals-Vitamins (CALCIUM-MAGNESIUM-ZINC-D3 PO) Take by mouth 3 (three) times daily.    . Pediatric Multiple Vit-C-FA (FLINSTONES GUMMIES OMEGA-3 DHA) CHEW Chew by mouth.    . vitamin C (ASCORBIC ACID) 500 MG tablet Take 500 mg by mouth daily.     Current Facility-Administered Medications on File Prior to Visit  Medication Dose Route Frequency Provider Last Rate Last Dose  . cyanocobalamin ((VITAMIN B-12)) injection 1,000 mcg  1,000 mcg Intramuscular Q30 days Claretta Fraise, MD   1,000 mcg at 12/25/17 0855  . cyanocobalamin ((VITAMIN B-12)) injection 200 mcg  200 mcg Intramuscular Q30 days Claretta Fraise, MD   200 mcg at 02/21/18 1147    ROS Review of Systems  Constitutional: Negative.   HENT: Negative for congestion.   Eyes: Negative for visual disturbance.  Respiratory: Negative for shortness of breath.   Cardiovascular: Negative for chest pain.  Gastrointestinal: Negative for abdominal pain, constipation, diarrhea, nausea and vomiting.  Genitourinary: Negative for difficulty urinating.  Musculoskeletal: Positive for arthralgias (Much better on the current prednisone dose.  She is been unable to wean below 5 yet she is going to try for for the next few days.). Negative for myalgias.  Neurological: Negative for headaches.  Psychiatric/Behavioral: Negative for sleep disturbance.    Objective:  BP 114/78   Pulse 75   Temp (!) 96.9 F (36.1 C) (Oral)   Ht 5' 4"  (1.626 m)   Wt 182 lb 12.8 oz (82.9 kg)   LMP 04/11/1986   BMI 31.38 kg/m  BP Readings from Last 3 Encounters:  02/21/18 114/78  01/24/18 125/77  12/25/17 115/71    Wt Readings from Last 3 Encounters:  02/21/18 182 lb 12.8 oz (82.9 kg)  01/24/18 181 lb (82.1 kg)  12/25/17 187 lb 2 oz (84.9 kg)     Physical Exam  Constitutional: She is oriented to person, place, and time. She appears  well-developed and well-nourished. No distress.  HENT:  Head: Normocephalic and atraumatic.  Right Ear: External ear normal.  Left Ear: External ear normal.  Nose: Nose normal.  Mouth/Throat: Oropharynx is clear and moist.  Eyes: Pupils are equal, round, and reactive to light. Conjunctivae and EOM are normal.  Neck: Normal range of motion. Neck supple. No thyromegaly present.  Cardiovascular: Normal rate, regular rhythm and normal heart sounds.  No murmur heard. Pulmonary/Chest: Effort normal and breath sounds normal. No respiratory distress. She has no wheezes. She has no rales.  Abdominal: Soft. Bowel sounds are normal. She exhibits no distension. There is no tenderness.  Lymphadenopathy:    She has no cervical adenopathy.  Neurological: She is alert and oriented to person, place, and time. She has normal reflexes.  Skin: Skin is warm and dry.  Psychiatric: She has a normal mood and affect. Her behavior is normal. Judgment and thought content normal.    Diabetic Foot Exam - Simple   No data filed        Assessment & Plan:   Nabilah was seen today for medical management of chronic issues.  Diagnoses and all orders for this visit:  Mixed hyperlipidemia -     CBC with Differential/Platelet -     CMP14+EGFR -     Lipid panel  Anarthritic rheumatoid disease ([place]) -     CBC with Differential/Platelet  Essential (primary) hypertension -     CBC with Differential/Platelet  Hypothyroidism, unspecified type -     CBC with Differential/Platelet -     TSH + free T4  Diabetic nephropathy associated with type 2 diabetes mellitus (HCC) -     Bayer DCA Hb A1c Waived -     CBC with Differential/Platelet -     CMP14+EGFR -     Lipid panel -     TSH + free T4  Avitaminosis D -     CBC with Differential/Platelet -     VITAMIN D 25 Hydroxy (Vit-D Deficiency, Fractures)  Breast nodule -     US BREAST COMPLETE UNI LEFT INC AXILLA; Future  Other orders -     atorvastatin  (LIPITOR) 40 MG tablet; Take 1 tablet (40 mg total) by mouth daily. -     Cyanocobalamin 1000 MCG/ML KIT; Inject 1,200 mcg as directed every 30 (thirty) days. -     levothyroxine (SYNTHROID, LEVOTHROID) 112 MCG tablet; TAKE 1 TABLET BY MOUTH ONCE DAILY BEFORE  BREAKFAST -     lisinopril (PRINIVIL,ZESTRIL) 20 MG tablet; Take 1 tablet (20 mg total) by mouth 2 (two) times daily. -     metFORMIN (GLUCOPHAGE-XR) 750 MG 24 hr tablet; Take 1 tablet (750 mg total) by mouth 2 (two) times daily. -     omeprazole (PRILOSEC) 40 MG capsule; Take 1 capsule (40 mg total) by mouth daily. -     predniSONE (DELTASONE) 1 MG tablet; Take 4 tablets (4 mg total) by mouth daily. -     raloxifene (EVISTA) 60 MG tablet; Take 1 tablet (60 mg total) by mouth daily. -     triamcinolone cream (KENALOG) 0.1 %;  Apply 1 application topically 3 (three) times daily. Avoid face and genitalia -     Varicella-zoster vaccine IM   I have discontinued Elfida Germano's sitaGLIPtin. I am also having her maintain her [place] KIT, Multiple Minerals-Vitamins (CALCIUM-MAGNESIUM-ZINC-D3 PO), glucose blood, FLINSTONES GUMMIES OMEGA-3 DHA, vitamin C, atorvastatin, Cyanocobalamin, levothyroxine, lisinopril, metFORMIN, omeprazole, predniSONE, raloxifene, and triamcinolone cream. We administered cyanocobalamin. We will continue to administer cyanocobalamin and cyanocobalamin.  Meds ordered this encounter  Medications  . atorvastatin (LIPITOR) 40 MG tablet    Sig: Take 1 tablet (40 mg total) by mouth daily.    Dispense:  90 tablet    Refill:  1  . Cyanocobalamin 1000 MCG/ML KIT    Sig: Inject 1,200 mcg as directed every 30 (thirty) days.    Dispense:  1 kit    Refill:  6  . levothyroxine (SYNTHROID, LEVOTHROID) 112 MCG tablet    Sig: TAKE 1 TABLET BY MOUTH ONCE DAILY BEFORE  BREAKFAST    Dispense:  90 tablet    Refill:  1  . lisinopril (PRINIVIL,ZESTRIL) 20 MG tablet    Sig: Take 1 tablet (20 mg total) by mouth 2 (two) times  daily.    Dispense:  180 tablet    Refill:  1  . metFORMIN (GLUCOPHAGE-XR) 750 MG 24 hr tablet    Sig: Take 1 tablet (750 mg total) by mouth 2 (two) times daily.    Dispense:  180 tablet    Refill:  1  . omeprazole (PRILOSEC) 40 MG capsule    Sig: Take 1 capsule (40 mg total) by mouth daily.    Dispense:  90 capsule    Refill:  1  . predniSONE (DELTASONE) 1 MG tablet    Sig: Take 4 tablets (4 mg total) by mouth daily.    Dispense:  120 tablet    Refill:  5  . raloxifene (EVISTA) 60 MG tablet    Sig: Take 1 tablet (60 mg total) by mouth daily.    Dispense:  90 tablet    Refill:  4  . triamcinolone cream (KENALOG) 0.1 %    Sig: Apply 1 application topically 3 (three) times daily. Avoid face and genitalia    Dispense:  45 g    Refill:  0     Follow-up: Return in about 3 months (around 05/24/2018).  Claretta Fraise, M.D.

## 2018-02-22 ENCOUNTER — Telehealth: Payer: Self-pay | Admitting: Family Medicine

## 2018-02-22 LAB — CMP14+EGFR
A/G RATIO: 1.9 (ref 1.2–2.2)
ALBUMIN: 3.9 g/dL (ref 3.5–4.8)
ALT: 15 IU/L (ref 0–32)
AST: 15 IU/L (ref 0–40)
Alkaline Phosphatase: 75 IU/L (ref 39–117)
BILIRUBIN TOTAL: 0.3 mg/dL (ref 0.0–1.2)
BUN / CREAT RATIO: 13 (ref 12–28)
BUN: 20 mg/dL (ref 8–27)
CALCIUM: 8.5 mg/dL — AB (ref 8.7–10.3)
CHLORIDE: 110 mmol/L — AB (ref 96–106)
CO2: 22 mmol/L (ref 20–29)
Creatinine, Ser: 1.55 mg/dL — ABNORMAL HIGH (ref 0.57–1.00)
GFR, EST AFRICAN AMERICAN: 38 mL/min/{1.73_m2} — AB (ref >59)
GFR, EST NON AFRICAN AMERICAN: 33 mL/min/{1.73_m2} — AB (ref >59)
GLOBULIN, TOTAL: 2.1 g/dL (ref 1.5–4.5)
Glucose: 119 mg/dL — ABNORMAL HIGH (ref 65–99)
POTASSIUM: 4.6 mmol/L (ref 3.5–5.2)
SODIUM: 145 mmol/L — AB (ref 134–144)
TOTAL PROTEIN: 6 g/dL (ref 6.0–8.5)

## 2018-02-22 LAB — LIPID PANEL
CHOLESTEROL TOTAL: 162 mg/dL (ref 100–199)
Chol/HDL Ratio: 2.8 ratio (ref 0.0–4.4)
HDL: 57 mg/dL (ref >39)
LDL Calculated: 86 mg/dL (ref 0–99)
Triglycerides: 96 mg/dL (ref 0–149)
VLDL CHOLESTEROL CAL: 19 mg/dL (ref 5–40)

## 2018-02-22 LAB — CBC WITH DIFFERENTIAL/PLATELET
BASOS: 1 %
Basophils Absolute: 0.1 10*3/uL (ref 0.0–0.2)
EOS (ABSOLUTE): 0.1 10*3/uL (ref 0.0–0.4)
EOS: 1 %
HEMATOCRIT: 32.8 % — AB (ref 34.0–46.6)
Hemoglobin: 10.3 g/dL — ABNORMAL LOW (ref 11.1–15.9)
IMMATURE GRANS (ABS): 0 10*3/uL (ref 0.0–0.1)
IMMATURE GRANULOCYTES: 0 %
LYMPHS: 12 %
Lymphocytes Absolute: 0.9 10*3/uL (ref 0.7–3.1)
MCH: 28.5 pg (ref 26.6–33.0)
MCHC: 31.4 g/dL — ABNORMAL LOW (ref 31.5–35.7)
MCV: 91 fL (ref 79–97)
MONOS ABS: 0.4 10*3/uL (ref 0.1–0.9)
Monocytes: 5 %
NEUTROS ABS: 6.1 10*3/uL (ref 1.4–7.0)
NEUTROS PCT: 81 %
PLATELETS: 222 10*3/uL (ref 150–450)
RBC: 3.62 x10E6/uL — ABNORMAL LOW (ref 3.77–5.28)
RDW: 13.8 % (ref 12.3–15.4)
WBC: 7.6 10*3/uL (ref 3.4–10.8)

## 2018-02-22 LAB — TSH+FREE T4
Free T4: 1.37 ng/dL (ref 0.82–1.77)
TSH: 0.464 u[IU]/mL (ref 0.450–4.500)

## 2018-02-22 LAB — VITAMIN D 25 HYDROXY (VIT D DEFICIENCY, FRACTURES): Vit D, 25-Hydroxy: 20.7 ng/mL — ABNORMAL LOW (ref 30.0–100.0)

## 2018-02-22 MED ORDER — VITAMIN D (ERGOCALCIFEROL) 1.25 MG (50000 UNIT) PO CAPS: 50000.0000 [IU] | ORAL_CAPSULE | ORAL | 1 refills | Status: DC

## 2018-02-22 NOTE — Telephone Encounter (Signed)
Patient aware of lab results and verbalizes understanding.  

## 2018-02-22 NOTE — Addendum Note (Signed)
Addended by: Lorelee Cover C on: 02/22/2018 02:45 PM   Modules accepted: Orders

## 2018-03-21 LAB — HM MAMMOGRAPHY

## 2018-04-12 ENCOUNTER — Ambulatory Visit (INDEPENDENT_AMBULATORY_CARE_PROVIDER_SITE_OTHER): Payer: Medicare Other | Admitting: *Deleted

## 2018-04-12 DIAGNOSIS — E538 Deficiency of other specified B group vitamins: Secondary | ICD-10-CM | POA: Diagnosis not present

## 2018-04-12 NOTE — Progress Notes (Signed)
Pt given cyanocobalamin inj Tolerated well 

## 2018-05-14 ENCOUNTER — Ambulatory Visit (INDEPENDENT_AMBULATORY_CARE_PROVIDER_SITE_OTHER): Payer: Medicare Other | Admitting: *Deleted

## 2018-05-14 DIAGNOSIS — E538 Deficiency of other specified B group vitamins: Secondary | ICD-10-CM

## 2018-05-14 DIAGNOSIS — Z23 Encounter for immunization: Secondary | ICD-10-CM

## 2018-05-14 NOTE — Progress Notes (Signed)
Pt given Cyanocobalamin inj Pt also given Shingrix #2 vaccine Tolerated well

## 2018-06-05 ENCOUNTER — Other Ambulatory Visit: Payer: Self-pay | Admitting: Family Medicine

## 2018-06-05 ENCOUNTER — Ambulatory Visit: Payer: Self-pay | Admitting: Family Medicine

## 2018-06-15 ENCOUNTER — Ambulatory Visit: Payer: Medicare Other | Admitting: Family Medicine

## 2018-06-15 ENCOUNTER — Encounter: Payer: Self-pay | Admitting: Family Medicine

## 2018-06-15 VITALS — BP 100/59 | HR 76 | Temp 97.2°F | Ht 64.0 in | Wt 182.1 lb

## 2018-06-15 DIAGNOSIS — E782 Mixed hyperlipidemia: Secondary | ICD-10-CM | POA: Diagnosis not present

## 2018-06-15 DIAGNOSIS — M353 Polymyalgia rheumatica: Secondary | ICD-10-CM

## 2018-06-15 DIAGNOSIS — N183 Chronic kidney disease, stage 3 unspecified: Secondary | ICD-10-CM

## 2018-06-15 DIAGNOSIS — E1121 Type 2 diabetes mellitus with diabetic nephropathy: Secondary | ICD-10-CM | POA: Diagnosis not present

## 2018-06-15 DIAGNOSIS — E1122 Type 2 diabetes mellitus with diabetic chronic kidney disease: Secondary | ICD-10-CM | POA: Diagnosis not present

## 2018-06-15 DIAGNOSIS — I10 Essential (primary) hypertension: Secondary | ICD-10-CM

## 2018-06-15 DIAGNOSIS — E538 Deficiency of other specified B group vitamins: Secondary | ICD-10-CM | POA: Diagnosis not present

## 2018-06-15 LAB — URINALYSIS
Bilirubin, UA: NEGATIVE
Glucose, UA: NEGATIVE
Ketones, UA: NEGATIVE
Nitrite, UA: NEGATIVE
PH UA: 5.5 (ref 5.0–7.5)
Protein, UA: NEGATIVE
RBC, UA: NEGATIVE
Specific Gravity, UA: 1.01 (ref 1.005–1.030)
Urobilinogen, Ur: 0.2 mg/dL (ref 0.2–1.0)

## 2018-06-15 LAB — BAYER DCA HB A1C WAIVED: HB A1C (BAYER DCA - WAIVED): 6 % (ref <7.0)

## 2018-06-15 MED ORDER — ATORVASTATIN CALCIUM 40 MG PO TABS: 40.0000 mg | ORAL_TABLET | Freq: Every day | ORAL | 1 refills | Status: DC

## 2018-06-15 MED ORDER — METFORMIN HCL ER 750 MG PO TB24: 750.0000 mg | ORAL_TABLET | Freq: Two times a day (BID) | ORAL | 1 refills | Status: DC

## 2018-06-15 MED ORDER — OMEPRAZOLE 40 MG PO CPDR: 40.0000 mg | DELAYED_RELEASE_CAPSULE | Freq: Every day | ORAL | 1 refills | Status: DC

## 2018-06-15 MED ORDER — LEVOTHYROXINE SODIUM 112 MCG PO TABS: ORAL_TABLET | ORAL | 1 refills | Status: DC

## 2018-06-15 MED ORDER — PREDNISONE 1 MG PO TABS: 4.0000 mg | ORAL_TABLET | Freq: Every day | ORAL | 5 refills | Status: DC

## 2018-06-15 MED ORDER — LISINOPRIL 20 MG PO TABS: 20.0000 mg | ORAL_TABLET | Freq: Two times a day (BID) | ORAL | 1 refills | Status: DC

## 2018-06-15 NOTE — Progress Notes (Signed)
Subjective:  [name],  female    DOB: 07-23-1945  Age: 73 y.o.    CC: Medical Management of Chronic Issues (pt here today for routine follow up and also has 2 spots on her left arm she would like looked at)   HPI Erin Ortiz presents for  follow-up of hypertension. Patient has no history of headache chest pain or shortness of breath or recent cough. Patient also denies symptoms of TIA such as numbness weakness lateralizing. Patient denies side effects from medication. States taking it regularly.  Patient also  in for follow-up of elevated cholesterol. Doing well without complaints on current medication. Denies side effects  including myalgia and arthralgia and nausea. Also in today for liver function testing. Currently no chest pain, shortness of breath or other cardiovascular related symptoms noted.  Follow-up of diabetes. Patient does check blood sugar at home. Readings run between 106 and 130s Patient denies symptoms such as excessive hunger or urinary frequency, excessive hunger, nausea No significant hypoglycemic spells noted. She feels a bit nauseated when it drops into the 90s. Medications reviewed. Pt reports taking them regularly. Pt. denies complication/adverse reaction today.   Patient reports an exacerbation of her rheumatoid disease.  She decided to treat this with hemp oil instead of prednisone this time.  She has maintained her 4 mg of prednisone daily.  The pain was primarily noted at the posterior neck.  She has been applying hemp oil to that with excellent results.  As a result her sugar has not climbed like it normally does with a steroid bolus.  Patient has 2 lesions on the left arm that she is like looked at.  Neither is causing any problems for her pain wise.  However, they seem to be changing in shape and size.   History Erin Ortiz has a past medical history of Anemia, Chronic kidney disease, and Diabetes mellitus without complication ([place]).   She  has a past surgical history that includes jejunostomy (05/17/2008); Gastric bypass (11/2001); and Vaginal hysterectomy (12/1986).   Her family history includes Cancer in her father and mother.She reports that she has never smoked. She has never used smokeless tobacco. She reports that she does not drink alcohol or use drugs.  Current Outpatient Medications on File Prior to Visit  Medication Sig Dispense Refill  . Blood Glucose Monitoring Suppl (ONE TOUCH ULTRA SYSTEM KIT) W/DEVICE KIT 1 kit by Does not apply route once. 1 each 0  . Cyanocobalamin 1000 MCG/ML KIT Inject 1,200 mcg as directed every 30 (thirty) days. 1 kit 6  . glucose blood (ONE TOUCH ULTRA TEST) test strip CHECK GLUCOSE 3 TIMES DAILY 300 each 3  . Multiple Minerals-Vitamins (CALCIUM-MAGNESIUM-ZINC-D3 PO) Take by mouth 3 (three) times daily.    . Pediatric Multiple Vit-C-FA (FLINSTONES GUMMIES OMEGA-3 DHA) CHEW Chew by mouth.    . raloxifene (EVISTA) 60 MG tablet Take 1 tablet (60 mg total) by mouth daily. 90 tablet 4  . triamcinolone cream (KENALOG) 0.1 % Apply 1 application topically 3 (three) times daily. Avoid face and genitalia 45 g 0  . vitamin C (ASCORBIC ACID) 500 MG tablet Take 500 mg by mouth daily.    . Vitamin D, Ergocalciferol, (DRISDOL) 1.25 MG (50000 UT) CAPS capsule Take 1 capsule (50,000 Units total) by mouth every 7 (seven) days. 13 capsule 1   Current Facility-Administered Medications on File Prior to Visit  Medication Dose Route Frequency Provider Last Rate Last Dose  . cyanocobalamin ((VITAMIN B-12)) injection 1,000  mcg  1,000 mcg Intramuscular Q30 days Claretta Fraise, MD   1,000 mcg at 06/15/18 1548    ROS Review of Systems  Constitutional: Negative.   HENT: Negative for congestion.   Eyes: Negative for visual disturbance.  Respiratory: Negative for shortness of breath.   Cardiovascular: Negative for chest pain.  Gastrointestinal: Negative for abdominal pain, constipation, diarrhea, nausea and vomiting.    Genitourinary: Negative for difficulty urinating.  Musculoskeletal: Positive for arthralgias, myalgias and neck pain.  Neurological: Negative for headaches.  Psychiatric/Behavioral: Negative for sleep disturbance.    Objective:  BP (!) 100/59   Pulse 76   Temp (!) 97.2 F (36.2 C) (Oral)   Ht 5' 4"  (1.626 m)   Wt 182 lb 2 oz (82.6 kg)   LMP 04/11/1986   BMI 31.26 kg/m   BP Readings from Last 3 Encounters:  06/15/18 (!) 100/59  02/21/18 114/78  01/24/18 125/77    Wt Readings from Last 3 Encounters:  06/15/18 182 lb 2 oz (82.6 kg)  02/21/18 182 lb 12.8 oz (82.9 kg)  01/24/18 181 lb (82.1 kg)     Physical Exam Constitutional:      General: She is not in acute distress.    Appearance: She is well-developed.  HENT:     Head: Normocephalic and atraumatic.     Right Ear: External ear normal.     Left Ear: External ear normal.     Nose: Nose normal.  Eyes:     Conjunctiva/sclera: Conjunctivae normal.     Pupils: Pupils are equal, round, and reactive to light.  Neck:     Musculoskeletal: Normal range of motion and neck supple.     Thyroid: No thyromegaly.  Cardiovascular:     Rate and Rhythm: Normal rate and regular rhythm.     Heart sounds: Normal heart sounds. No murmur.  Pulmonary:     Effort: Pulmonary effort is normal. No respiratory distress.     Breath sounds: Normal breath sounds. No wheezing or rales.  Abdominal:     General: Bowel sounds are normal. There is no distension.     Palpations: Abdomen is soft.     Tenderness: There is no abdominal tenderness.  Lymphadenopathy:     Cervical: No cervical adenopathy.  Skin:    General: Skin is warm and dry.     Findings: Lesion (  There is a 4 mm keratin horn at the left forearm.  There is a scaly erythematous circumscribed lesion at the left upper arm 5 mm) present.  Neurological:     Mental Status: She is alert and oriented to person, place, and time.     Deep Tendon Reflexes: Reflexes are normal and  symmetric.  Psychiatric:        Behavior: Behavior normal.        Thought Content: Thought content normal.        Judgment: Judgment normal.       Assessment & Plan:   Erin Ortiz was seen today for medical management of chronic issues.  Diagnoses and all orders for this visit:  Diabetic nephropathy associated with type 2 diabetes mellitus ([place])  Mixed hyperlipidemia -     Lipid panel  Anarthritic rheumatoid disease ([place]) -     Sedimentation rate  Type 2 diabetes mellitus with stage 3 chronic kidney disease, without long-term current use of insulin (HCC) -     CBC with Differential/Platelet -     CMP14+EGFR -     Microalbumin / creatinine urine  ratio -     Bayer DCA Hb A1c Waived -     Urinalysis  Essential (primary) hypertension  Other orders -     atorvastatin (LIPITOR) 40 MG tablet; Take 1 tablet (40 mg total) by mouth daily. -     levothyroxine (SYNTHROID, LEVOTHROID) 112 MCG tablet; TAKE 1 TABLET BY MOUTH ONCE DAILY BEFORE  BREAKFAST -     lisinopril (PRINIVIL,ZESTRIL) 20 MG tablet; Take 1 tablet (20 mg total) by mouth 2 (two) times daily. -     metFORMIN (GLUCOPHAGE-XR) 750 MG 24 hr tablet; Take 1 tablet (750 mg total) by mouth 2 (two) times daily. -     omeprazole (PRILOSEC) 40 MG capsule; Take 1 capsule (40 mg total) by mouth daily. -     predniSONE (DELTASONE) 1 MG tablet; Take 4 tablets (4 mg total) by mouth daily.   I am having [name] maintain her ONE TOUCH ULTRA SYSTEM KIT, Multiple Minerals-Vitamins (CALCIUM-MAGNESIUM-ZINC-D3 PO), Flinstones Gummies Omega-3 DHA, vitamin C, Cyanocobalamin, raloxifene, triamcinolone cream, Vitamin D (Ergocalciferol), glucose blood, atorvastatin, levothyroxine, lisinopril, metFORMIN, omeprazole, and predniSONE. We administered cyanocobalamin. We will continue to administer cyanocobalamin.  Meds ordered this encounter  Medications  . atorvastatin (LIPITOR) 40 MG tablet    Sig: Take 1 tablet (40 mg total) by mouth daily.     Dispense:  90 tablet    Refill:  1  . levothyroxine (SYNTHROID, LEVOTHROID) 112 MCG tablet    Sig: TAKE 1 TABLET BY MOUTH ONCE DAILY BEFORE  BREAKFAST    Dispense:  90 tablet    Refill:  1  . lisinopril (PRINIVIL,ZESTRIL) 20 MG tablet    Sig: Take 1 tablet (20 mg total) by mouth 2 (two) times daily.    Dispense:  180 tablet    Refill:  1  . metFORMIN (GLUCOPHAGE-XR) 750 MG 24 hr tablet    Sig: Take 1 tablet (750 mg total) by mouth 2 (two) times daily.    Dispense:  180 tablet    Refill:  1  . omeprazole (PRILOSEC) 40 MG capsule    Sig: Take 1 capsule (40 mg total) by mouth daily.    Dispense:  90 capsule    Refill:  1  . predniSONE (DELTASONE) 1 MG tablet    Sig: Take 4 tablets (4 mg total) by mouth daily.    Dispense:  120 tablet    Refill:  5     Follow-up: No follow-ups on file.  Claretta Fraise, M.D.

## 2018-06-16 LAB — CMP14+EGFR
ALT: 17 IU/L (ref 0–32)
AST: 18 IU/L (ref 0–40)
Albumin/Globulin Ratio: 1.9 (ref 1.2–2.2)
Albumin: 4.3 g/dL (ref 3.7–4.7)
Alkaline Phosphatase: 83 IU/L (ref 39–117)
BUN/Creatinine Ratio: 14 (ref 12–28)
BUN: 21 mg/dL (ref 8–27)
Bilirubin Total: 0.3 mg/dL (ref 0.0–1.2)
CO2: 19 mmol/L — ABNORMAL LOW (ref 20–29)
Calcium: 8.9 mg/dL (ref 8.7–10.3)
Chloride: 110 mmol/L — ABNORMAL HIGH (ref 96–106)
Creatinine, Ser: 1.54 mg/dL — ABNORMAL HIGH (ref 0.57–1.00)
GFR calc Af Amer: 38 mL/min/{1.73_m2} — ABNORMAL LOW (ref >59)
GFR calc non Af Amer: 33 mL/min/{1.73_m2} — ABNORMAL LOW (ref >59)
Globulin, Total: 2.3 g/dL (ref 1.5–4.5)
Glucose: 108 mg/dL — ABNORMAL HIGH (ref 65–99)
Potassium: 4.9 mmol/L (ref 3.5–5.2)
Sodium: 145 mmol/L — ABNORMAL HIGH (ref 134–144)
TOTAL PROTEIN: 6.6 g/dL (ref 6.0–8.5)

## 2018-06-16 LAB — LIPID PANEL
Chol/HDL Ratio: 2.5 ratio (ref 0.0–4.4)
Cholesterol, Total: 151 mg/dL (ref 100–199)
HDL: 61 mg/dL (ref >39)
LDL CALC: 71 mg/dL (ref 0–99)
Triglycerides: 97 mg/dL (ref 0–149)
VLDL CHOLESTEROL CAL: 19 mg/dL (ref 5–40)

## 2018-06-16 LAB — CBC WITH DIFFERENTIAL/PLATELET
Basophils Absolute: 0.1 10*3/uL (ref 0.0–0.2)
Basos: 1 %
EOS (ABSOLUTE): 0.1 10*3/uL (ref 0.0–0.4)
Eos: 1 %
Hematocrit: 31.9 % — ABNORMAL LOW (ref 34.0–46.6)
Hemoglobin: 10.9 g/dL — ABNORMAL LOW (ref 11.1–15.9)
IMMATURE GRANS (ABS): 0 10*3/uL (ref 0.0–0.1)
Immature Granulocytes: 0 %
Lymphocytes Absolute: 1.5 10*3/uL (ref 0.7–3.1)
Lymphs: 20 %
MCH: 30.7 pg (ref 26.6–33.0)
MCHC: 34.2 g/dL (ref 31.5–35.7)
MCV: 90 fL (ref 79–97)
Monocytes Absolute: 0.4 10*3/uL (ref 0.1–0.9)
Monocytes: 6 %
Neutrophils Absolute: 5.6 10*3/uL (ref 1.4–7.0)
Neutrophils: 72 %
PLATELETS: 221 10*3/uL (ref 150–450)
RBC: 3.55 x10E6/uL — ABNORMAL LOW (ref 3.77–5.28)
RDW: 14 % (ref 11.7–15.4)
WBC: 7.8 10*3/uL (ref 3.4–10.8)

## 2018-06-16 LAB — MICROALBUMIN / CREATININE URINE RATIO
Creatinine, Urine: 42.9 mg/dL
Microalb/Creat Ratio: <7 mg/g creat (ref 0–29)
Microalbumin, Urine: <3 ug/mL

## 2018-06-16 LAB — SEDIMENTATION RATE: Sed Rate: 7 mm/hr (ref 0–40)

## 2018-08-29 LAB — HM DIABETES EYE EXAM

## 2018-09-18 ENCOUNTER — Encounter: Payer: Self-pay | Admitting: Family Medicine

## 2018-09-18 ENCOUNTER — Ambulatory Visit (INDEPENDENT_AMBULATORY_CARE_PROVIDER_SITE_OTHER): Payer: Medicare Other | Admitting: Family Medicine

## 2018-09-18 ENCOUNTER — Other Ambulatory Visit: Payer: Self-pay

## 2018-09-18 DIAGNOSIS — E538 Deficiency of other specified B group vitamins: Secondary | ICD-10-CM

## 2018-09-18 DIAGNOSIS — E039 Hypothyroidism, unspecified: Secondary | ICD-10-CM

## 2018-09-18 DIAGNOSIS — E1121 Type 2 diabetes mellitus with diabetic nephropathy: Secondary | ICD-10-CM | POA: Diagnosis not present

## 2018-09-18 MED ORDER — CYANOCOBALAMIN 1000 MCG/ML IJ KIT: PACK | INTRAMUSCULAR | 3 refills | Status: AC

## 2018-09-18 NOTE — Progress Notes (Signed)
Subjective:  [name], female    DOB: 03-29-1946  Age: 73 y.o. MRN: [id number]  CC: No chief complaint on file.   HPI Erin Ortiz presents forFollow-up of diabetes. Patient checks blood sugar at home.   125 fasting and 90-120 postprandial Patient denies symptoms such as polyuria, polydipsia, excessive hunger, nausea No significant hypoglycemic spells noted. Medications reviewed. Pt reports taking them regularly without complication/adverse reaction being reported today.  Checking feet daily. Last eye appt was  Can't taper under 4 mg of prednisone due to pain - neck, shoulders, elbows gets severe.  History Erin Ortiz has a past medical history of Anemia, Chronic kidney disease, and Diabetes mellitus without complication ([place]).   She has a past surgical history that includes jejunostomy (05/17/2008); Gastric bypass (11/2001); and Vaginal hysterectomy (12/1986).   Her family history includes Cancer in her father and mother.She reports that she has never smoked. She has never used smokeless tobacco. She reports that she does not drink alcohol or use drugs.  Current Outpatient Medications on File Prior to Visit  Medication Sig Dispense Refill  . atorvastatin (LIPITOR) 40 MG tablet Take 1 tablet (40 mg total) by mouth daily. 90 tablet 1  . Blood Glucose Monitoring Suppl (ONE TOUCH ULTRA SYSTEM KIT) W/DEVICE KIT 1 kit by Does not apply route once. 1 each 0  . Cyanocobalamin 1000 MCG/ML KIT Inject 1,200 mcg as directed every 30 (thirty) days. 1 kit 6  . glucose blood (ONE TOUCH ULTRA TEST) test strip CHECK GLUCOSE 3 TIMES DAILY 300 each 3  . levothyroxine (SYNTHROID, LEVOTHROID) 112 MCG tablet TAKE 1 TABLET BY MOUTH ONCE DAILY BEFORE  BREAKFAST 90 tablet 1  . lisinopril (PRINIVIL,ZESTRIL) 20 MG tablet Take 1 tablet (20 mg total) by mouth 2 (two) times daily. 180 tablet 1  . metFORMIN (GLUCOPHAGE-XR) 750 MG 24 hr tablet Take 1 tablet (750 mg total) by mouth 2 (two) times daily.  180 tablet 1  . Multiple Minerals-Vitamins (CALCIUM-MAGNESIUM-ZINC-D3 PO) Take by mouth 3 (three) times daily.    Marland Kitchen omeprazole (PRILOSEC) 40 MG capsule Take 1 capsule (40 mg total) by mouth daily. 90 capsule 1  . Pediatric Multiple Vit-C-FA (FLINSTONES GUMMIES OMEGA-3 DHA) CHEW Chew by mouth.    . predniSONE (DELTASONE) 1 MG tablet Take 4 tablets (4 mg total) by mouth daily. 120 tablet 5  . raloxifene (EVISTA) 60 MG tablet Take 1 tablet (60 mg total) by mouth daily. 90 tablet 4  . triamcinolone cream (KENALOG) 0.1 % Apply 1 application topically 3 (three) times daily. Avoid face and genitalia 45 g 0  . vitamin C (ASCORBIC ACID) 500 MG tablet Take 500 mg by mouth daily.    . Vitamin D, Ergocalciferol, (DRISDOL) 1.25 MG (50000 UT) CAPS capsule Take 1 capsule (50,000 Units total) by mouth every 7 (seven) days. 13 capsule 1   Current Facility-Administered Medications on File Prior to Visit  Medication Dose Route Frequency Provider Last Rate Last Dose  . cyanocobalamin ((VITAMIN B-12)) injection 1,000 mcg  1,000 mcg Intramuscular Q30 days Claretta Fraise, MD   1,000 mcg at 06/15/18 1548    ROS Review of Systems  Objective:  LMP 04/11/1986   BP Readings from Last 3 Encounters:  06/15/18 (!) 100/59  02/21/18 114/78  01/24/18 125/77    Wt Readings from Last 3 Encounters:  06/15/18 182 lb 2 oz (82.6 kg)  02/21/18 182 lb 12.8 oz (82.9 kg)  01/24/18 181 lb (82.1 kg)     Physical Exam  Assessment & Plan:   There are no diagnoses linked to this encounter.    I am having [name] maintain her ONE TOUCH ULTRA SYSTEM KIT, Multiple Minerals-Vitamins (CALCIUM-MAGNESIUM-ZINC-D3 PO), Flinstones Gummies Omega-3 DHA, vitamin C, Cyanocobalamin, raloxifene, triamcinolone cream, Vitamin D (Ergocalciferol), glucose blood, atorvastatin, levothyroxine, lisinopril, metFORMIN, omeprazole, and predniSONE. We will continue to administer cyanocobalamin.  No orders of the defined types were placed  in this encounter.    Follow-up: No follow-ups on file.  Claretta Fraise, M.D.

## 2018-09-28 ENCOUNTER — Telehealth: Payer: Self-pay | Admitting: Family Medicine

## 2018-10-01 ENCOUNTER — Other Ambulatory Visit: Payer: Self-pay | Admitting: Family Medicine

## 2018-10-01 MED ORDER — SAXAGLIPTIN-METFORMIN ER 5-500 MG PO TB24: 1.0000 | ORAL_TABLET | Freq: Every day | ORAL | 2 refills | Status: DC

## 2018-10-01 NOTE — Telephone Encounter (Signed)
Recall on Metformin - please advise

## 2018-10-01 NOTE — Telephone Encounter (Signed)
NA- which medication ?

## 2018-10-01 NOTE — Telephone Encounter (Signed)
Patient aware and verbalizes understanding. 

## 2018-10-01 NOTE — Telephone Encounter (Signed)
I sent in a substitute with 2 ingredients that should work as well or better. One pill once a day.

## 2018-12-19 ENCOUNTER — Ambulatory Visit: Payer: Medicare Other | Admitting: Family Medicine

## 2019-01-02 ENCOUNTER — Other Ambulatory Visit: Payer: Self-pay

## 2019-01-02 ENCOUNTER — Encounter: Payer: Self-pay | Admitting: Family Medicine

## 2019-01-02 ENCOUNTER — Ambulatory Visit (INDEPENDENT_AMBULATORY_CARE_PROVIDER_SITE_OTHER): Payer: Medicare Other | Admitting: Family Medicine

## 2019-01-02 VITALS — BP 128/77 | HR 82 | Temp 98.4°F | Ht 64.0 in | Wt 191.0 lb

## 2019-01-02 DIAGNOSIS — E039 Hypothyroidism, unspecified: Secondary | ICD-10-CM | POA: Diagnosis not present

## 2019-01-02 DIAGNOSIS — E782 Mixed hyperlipidemia: Secondary | ICD-10-CM

## 2019-01-02 DIAGNOSIS — E1122 Type 2 diabetes mellitus with diabetic chronic kidney disease: Secondary | ICD-10-CM | POA: Diagnosis not present

## 2019-01-02 DIAGNOSIS — I10 Essential (primary) hypertension: Secondary | ICD-10-CM | POA: Diagnosis not present

## 2019-01-02 DIAGNOSIS — Z23 Encounter for immunization: Secondary | ICD-10-CM | POA: Diagnosis not present

## 2019-01-02 DIAGNOSIS — N183 Chronic kidney disease, stage 3 unspecified: Secondary | ICD-10-CM

## 2019-01-02 LAB — URINALYSIS
Bilirubin, UA: NEGATIVE
Glucose, UA: NEGATIVE
Ketones, UA: NEGATIVE
Leukocytes,UA: NEGATIVE
Nitrite, UA: NEGATIVE
Protein,UA: NEGATIVE
RBC, UA: NEGATIVE
Specific Gravity, UA: 1.01 (ref 1.005–1.030)
Urobilinogen, Ur: 0.2 mg/dL (ref 0.2–1.0)
pH, UA: 5.5 (ref 5.0–7.5)

## 2019-01-02 LAB — BAYER DCA HB A1C WAIVED: HB A1C (BAYER DCA - WAIVED): 6.4 % (ref <7.0)

## 2019-01-02 NOTE — Progress Notes (Signed)
Subjective:  [name],  female    DOB: Sep 20, 1945  Age: 73 y.o.    CC: Diabetes (3 month ) and Medical Management of Chronic Issues   HPI Erin Ortiz presents for  follow-up of hypertension. Patient has no history of headache chest pain or shortness of breath or recent cough. Patient also denies symptoms of TIA such as numbness weakness lateralizing. Patient denies side effects from medication. States taking it regularly.  Patient also  in for follow-up of elevated cholesterol. Doing well without complaints on current medication. Denies side effects  including myalgia and arthralgia and nausea. Also in today for liver function testing. Currently no chest pain, shortness of breath or other cardiovascular related symptoms noted.  Follow-up of diabetes. Patient does check blood sugar at home. Readings run between 90 and 120 both fasting and postprandial. Patient denies symptoms such as excessive hunger or urinary frequency, excessive hunger, nausea No significant hypoglycemic spells noted. Medications reviewed. Pt reports taking them regularly. Pt. denies complication/adverse reaction today.   Patient has a history of colon polyps most recent colonoscopy 3 years ago showed 3 polyps we do not have the biopsy report currently.  She was asked to follow-up at the end of 3 years and plans to make those arrangements herself with Dr. Janus Molder of [facility] [place].   History Erin Ortiz has a past medical history of Anemia, Chronic kidney disease, and Diabetes mellitus without complication ([place]).   She has a past surgical history that includes jejunostomy (05/17/2008); Gastric bypass (11/2001); and Vaginal hysterectomy (12/1986).   Her family history includes Cancer in her father and mother.She reports that she has never smoked. She has never used smokeless tobacco. She reports that she does not drink alcohol or use drugs.  Current Outpatient Medications on File Prior to Visit   Medication Sig Dispense Refill  . atorvastatin (LIPITOR) 40 MG tablet Take 1 tablet (40 mg total) by mouth daily. 90 tablet 1  . Blood Glucose Monitoring Suppl (ONE TOUCH ULTRA SYSTEM KIT) W/DEVICE KIT 1 kit by Does not apply route once. 1 each 0  . Cyanocobalamin 1000 MCG/ML KIT Give 1 ml from one vial and 0.2 ml from second vial monthly 4 kit 3  . glucose blood (ONE TOUCH ULTRA TEST) test strip CHECK GLUCOSE 3 TIMES DAILY 300 each 3  . levothyroxine (SYNTHROID, LEVOTHROID) 112 MCG tablet TAKE 1 TABLET BY MOUTH ONCE DAILY BEFORE  BREAKFAST 90 tablet 1  . lisinopril (PRINIVIL,ZESTRIL) 20 MG tablet Take 1 tablet (20 mg total) by mouth 2 (two) times daily. 180 tablet 1  . metFORMIN (GLUCOPHAGE-XR) 750 MG 24 hr tablet Take 750 mg by mouth 2 (two) times daily.    . Multiple Minerals-Vitamins (CALCIUM-MAGNESIUM-ZINC-D3 PO) Take by mouth 3 (three) times daily.    Marland Kitchen omeprazole (PRILOSEC) 40 MG capsule Take 1 capsule (40 mg total) by mouth daily. 90 capsule 1  . Pediatric Multiple Vit-C-FA (FLINSTONES GUMMIES OMEGA-3 DHA) CHEW Chew by mouth.    . predniSONE (DELTASONE) 1 MG tablet Take 4 tablets (4 mg total) by mouth daily. 120 tablet 5  . raloxifene (EVISTA) 60 MG tablet Take 1 tablet (60 mg total) by mouth daily. 90 tablet 4  . vitamin C (ASCORBIC ACID) 500 MG tablet Take 500 mg by mouth daily.    . Vitamin D, Ergocalciferol, (DRISDOL) 1.25 MG (50000 UT) CAPS capsule Take 1 capsule (50,000 Units total) by mouth every 7 (seven) days. 13 capsule 1   Current Facility-Administered Medications  on File Prior to Visit  Medication Dose Route Frequency Provider Last Rate Last Dose  . cyanocobalamin ((VITAMIN B-12)) injection 1,000 mcg  1,000 mcg Intramuscular Q30 days Claretta Fraise, MD   1,000 mcg at 06/15/18 1548    ROS Review of Systems  Constitutional: Negative.   HENT: Negative for congestion.   Eyes: Negative for visual disturbance.  Respiratory: Negative for shortness of breath.   Cardiovascular:  Negative for chest pain.  Gastrointestinal: Negative for abdominal pain, constipation, diarrhea, nausea and vomiting.  Genitourinary: Negative for difficulty urinating.  Musculoskeletal: Positive for arthralgias (Patient states rheumatoid arthritis causes joint pain.  This is stable on her current regimen of 3 mg prednisone alternating with 4 mg daily.). Negative for myalgias.  Neurological: Negative for headaches.  Psychiatric/Behavioral: Negative for sleep disturbance.    Objective:  BP 128/77   Pulse 82   Temp 98.4 F (36.9 C) (Temporal)   Ht 5' 4"  (1.626 m)   Wt 191 lb (86.6 kg)   LMP 04/11/1986   SpO2 100%   BMI 32.79 kg/m   BP Readings from Last 3 Encounters:  01/02/19 128/77  06/15/18 (!) 100/59  02/21/18 114/78   Test test test  Wt Readings from Last 3 Encounters:  01/02/19 191 lb (86.6 kg)  06/15/18 182 lb 2 oz (82.6 kg)  02/21/18 182 lb 12.8 oz (82.9 kg)     Physical Exam Constitutional:      General: She is not in acute distress.    Appearance: She is well-developed.  HENT:     Head: Normocephalic and atraumatic.     Right Ear: External ear normal.     Left Ear: External ear normal.     Nose: Nose normal.  Eyes:     Conjunctiva/sclera: Conjunctivae normal.     Pupils: Pupils are equal, round, and reactive to light.  Neck:     Musculoskeletal: Normal range of motion and neck supple.     Thyroid: No thyromegaly.  Cardiovascular:     Rate and Rhythm: Normal rate and regular rhythm.     Heart sounds: Normal heart sounds. No murmur.  Pulmonary:     Effort: Pulmonary effort is normal. No respiratory distress.     Breath sounds: Normal breath sounds. No wheezing or rales.  Abdominal:     General: Bowel sounds are normal. There is no distension.     Palpations: Abdomen is soft.     Tenderness: There is no abdominal tenderness.  Lymphadenopathy:     Cervical: No cervical adenopathy.  Skin:    General: Skin is warm and dry.  Neurological:     Mental  Status: She is alert and oriented to person, place, and time.     Deep Tendon Reflexes: Reflexes are normal and symmetric.  Psychiatric:        Behavior: Behavior normal.        Thought Content: Thought content normal.        Judgment: Judgment normal.     Diabetic Foot Exam - Simple   No data filed        Assessment & Plan:   Erin Ortiz was seen today for diabetes and medical management of chronic issues.  Diagnoses and all orders for this visit:  Hypothyroidism, unspecified type -     Thyroid Panel With TSH  Mixed hyperlipidemia -     CMP14+EGFR -     Lipid panel  Type 2 diabetes mellitus with stage 3 chronic kidney disease, without long-term current use  of insulin ([place]) -     Bayer DCA Hb A1c Waived -     Urinalysis -     Microalbumin / creatinine urine ratio  Essential (primary) hypertension -     CBC with Differential/Platelet  Need for immunization against influenza -     Flu Vaccine QUAD High Dose(Fluad)   I have discontinued Dara Keahey's triamcinolone cream, Saxagliptin-Metformin, and Glucophage XR. I am also having her maintain her [place] KIT, Multiple Minerals-Vitamins (CALCIUM-MAGNESIUM-ZINC-D3 PO), Flinstones Gummies Omega-3 DHA, vitamin C, raloxifene, Vitamin D (Ergocalciferol), glucose blood, atorvastatin, levothyroxine, lisinopril, omeprazole, predniSONE, Cyanocobalamin, and metFORMIN. We will continue to administer cyanocobalamin.  Today's hemoglobin A1c is 6.4.  Excellent work!  Even though you are doing well I do advise you to be very careful with snacks and carbohydrates.  Monitor the mole on your nose.  If it seems to grow quickly you may want to see a dermatologist sooner.  Otherwise, just show it to your dermatologist at your routine follow-up in December.  Follow-up: Return in about 3 months (around 04/03/2019).  Claretta Fraise, M.D.

## 2019-01-02 NOTE — Patient Instructions (Signed)
Today's hemoglobin A1c is 6.4.  Excellent work!  Even though you are doing well I do advise you to be very careful with snacks and carbohydrates.  Monitor the mole on your nose.  If it seems to grow quickly you may want to see a dermatologist sooner.  Otherwise, just show it to your dermatologist at your routine follow-up in December.

## 2019-01-03 LAB — MICROALBUMIN / CREATININE URINE RATIO
Creatinine, Urine: 28.7 mg/dL
Microalb/Creat Ratio: <10 mg/g creat (ref 0–29)
Microalbumin, Urine: <3 ug/mL

## 2019-01-03 LAB — CBC WITH DIFFERENTIAL/PLATELET
Basophils Absolute: 0.1 10*3/uL (ref 0.0–0.2)
Basos: 1 %
EOS (ABSOLUTE): 0.1 10*3/uL (ref 0.0–0.4)
Eos: 1 %
Hematocrit: 31.1 % — ABNORMAL LOW (ref 34.0–46.6)
Hemoglobin: 10 g/dL — ABNORMAL LOW (ref 11.1–15.9)
Immature Grans (Abs): 0 10*3/uL (ref 0.0–0.1)
Immature Granulocytes: 0 %
Lymphocytes Absolute: 1.2 10*3/uL (ref 0.7–3.1)
Lymphs: 15 %
MCH: 29.3 pg (ref 26.6–33.0)
MCHC: 32.2 g/dL (ref 31.5–35.7)
MCV: 91 fL (ref 79–97)
Monocytes Absolute: 0.4 10*3/uL (ref 0.1–0.9)
Monocytes: 6 %
Neutrophils Absolute: 6.2 10*3/uL (ref 1.4–7.0)
Neutrophils: 77 %
Platelets: 213 10*3/uL (ref 150–450)
RBC: 3.41 x10E6/uL — ABNORMAL LOW (ref 3.77–5.28)
RDW: 14 % (ref 11.7–15.4)
WBC: 8 10*3/uL (ref 3.4–10.8)

## 2019-01-03 LAB — CMP14+EGFR
ALT: 27 IU/L (ref 0–32)
AST: 24 IU/L (ref 0–40)
Albumin/Globulin Ratio: 1.5 (ref 1.2–2.2)
Albumin: 3.8 g/dL (ref 3.7–4.7)
Alkaline Phosphatase: 91 IU/L (ref 39–117)
BUN/Creatinine Ratio: 16 (ref 12–28)
BUN: 23 mg/dL (ref 8–27)
Bilirubin Total: 0.2 mg/dL (ref 0.0–1.2)
CO2: 21 mmol/L (ref 20–29)
Calcium: 8.4 mg/dL — ABNORMAL LOW (ref 8.7–10.3)
Chloride: 109 mmol/L — ABNORMAL HIGH (ref 96–106)
Creatinine, Ser: 1.42 mg/dL — ABNORMAL HIGH (ref 0.57–1.00)
GFR calc Af Amer: 42 mL/min/{1.73_m2} — ABNORMAL LOW (ref >59)
GFR calc non Af Amer: 36 mL/min/{1.73_m2} — ABNORMAL LOW (ref >59)
Globulin, Total: 2.5 g/dL (ref 1.5–4.5)
Glucose: 110 mg/dL — ABNORMAL HIGH (ref 65–99)
Potassium: 4.6 mmol/L (ref 3.5–5.2)
Sodium: 142 mmol/L (ref 134–144)
Total Protein: 6.3 g/dL (ref 6.0–8.5)

## 2019-01-03 LAB — THYROID PANEL WITH TSH
Free Thyroxine Index: 1.6 (ref 1.2–4.9)
T3 Uptake Ratio: 26 % (ref 24–39)
T4, Total: 6.2 ug/dL (ref 4.5–12.0)
TSH: 3.08 u[IU]/mL (ref 0.450–4.500)

## 2019-01-03 LAB — LIPID PANEL
Chol/HDL Ratio: 2.5 ratio (ref 0.0–4.4)
Cholesterol, Total: 153 mg/dL (ref 100–199)
HDL: 61 mg/dL (ref >39)
LDL Chol Calc (NIH): 74 mg/dL (ref 0–99)
Triglycerides: 101 mg/dL (ref 0–149)
VLDL Cholesterol Cal: 18 mg/dL (ref 5–40)

## 2019-01-03 NOTE — Progress Notes (Signed)
Hello Arvie,  Your lab result is normal and/or stable.Some minor variations that are not significant are commonly marked abnormal, but do not represent any medical problem for you.  Best regards, Yuritzi Kamp, M.D.

## 2019-03-11 ENCOUNTER — Other Ambulatory Visit: Payer: Self-pay | Admitting: Family Medicine

## 2019-03-13 ENCOUNTER — Ambulatory Visit: Payer: Medicare Other

## 2019-03-14 ENCOUNTER — Telehealth: Payer: Self-pay | Admitting: Family Medicine

## 2019-03-14 ENCOUNTER — Other Ambulatory Visit: Payer: Self-pay

## 2019-03-15 ENCOUNTER — Ambulatory Visit (INDEPENDENT_AMBULATORY_CARE_PROVIDER_SITE_OTHER): Payer: Medicare Other

## 2019-03-15 DIAGNOSIS — E538 Deficiency of other specified B group vitamins: Secondary | ICD-10-CM | POA: Diagnosis not present

## 2019-03-15 NOTE — Progress Notes (Signed)
Cyanocobalamin injection given to right deltoid.  Patient tolerated well. 

## 2019-03-25 ENCOUNTER — Other Ambulatory Visit: Payer: Self-pay | Admitting: Family Medicine

## 2019-04-15 ENCOUNTER — Other Ambulatory Visit: Payer: Self-pay

## 2019-04-16 ENCOUNTER — Ambulatory Visit: Payer: Medicare PPO | Admitting: Family Medicine

## 2019-04-16 ENCOUNTER — Other Ambulatory Visit: Payer: Self-pay | Admitting: *Deleted

## 2019-04-16 ENCOUNTER — Encounter: Payer: Self-pay | Admitting: Family Medicine

## 2019-04-16 VITALS — BP 123/73 | HR 76 | Temp 96.0°F | Ht 64.0 in | Wt 187.2 lb

## 2019-04-16 DIAGNOSIS — I10 Essential (primary) hypertension: Secondary | ICD-10-CM | POA: Diagnosis not present

## 2019-04-16 DIAGNOSIS — Z13820 Encounter for screening for osteoporosis: Secondary | ICD-10-CM | POA: Diagnosis not present

## 2019-04-16 DIAGNOSIS — N183 Chronic kidney disease, stage 3 unspecified: Secondary | ICD-10-CM | POA: Diagnosis not present

## 2019-04-16 DIAGNOSIS — E1122 Type 2 diabetes mellitus with diabetic chronic kidney disease: Secondary | ICD-10-CM

## 2019-04-16 DIAGNOSIS — E538 Deficiency of other specified B group vitamins: Secondary | ICD-10-CM

## 2019-04-16 DIAGNOSIS — M353 Polymyalgia rheumatica: Secondary | ICD-10-CM | POA: Diagnosis not present

## 2019-04-16 DIAGNOSIS — D692 Other nonthrombocytopenic purpura: Secondary | ICD-10-CM | POA: Diagnosis not present

## 2019-04-16 DIAGNOSIS — E039 Hypothyroidism, unspecified: Secondary | ICD-10-CM | POA: Diagnosis not present

## 2019-04-16 DIAGNOSIS — E782 Mixed hyperlipidemia: Secondary | ICD-10-CM | POA: Diagnosis not present

## 2019-04-16 DIAGNOSIS — Z780 Asymptomatic menopausal state: Secondary | ICD-10-CM | POA: Diagnosis not present

## 2019-04-16 LAB — BAYER DCA HB A1C WAIVED: HB A1C (BAYER DCA - WAIVED): 6.5 % (ref <7.0)

## 2019-04-16 MED ORDER — LISINOPRIL 20 MG PO TABS: 20.0000 mg | ORAL_TABLET | Freq: Two times a day (BID) | ORAL | 1 refills | Status: DC

## 2019-04-16 MED ORDER — METFORMIN HCL ER 750 MG PO TB24: 750.0000 mg | ORAL_TABLET | Freq: Two times a day (BID) | ORAL | 3 refills | Status: DC

## 2019-04-16 MED ORDER — LEVOTHYROXINE SODIUM 112 MCG PO TABS: ORAL_TABLET | ORAL | 3 refills | Status: DC

## 2019-04-16 MED ORDER — OMEPRAZOLE 40 MG PO CPDR: 40.0000 mg | DELAYED_RELEASE_CAPSULE | Freq: Every day | ORAL | 1 refills | Status: DC

## 2019-04-16 MED ORDER — ATORVASTATIN CALCIUM 40 MG PO TABS: 40.0000 mg | ORAL_TABLET | Freq: Every day | ORAL | 3 refills | Status: DC

## 2019-04-16 NOTE — Progress Notes (Signed)
Subjective:  [name], female    DOB: 1945/10/05  Age: 74 y.o. MRN: [id number]  CC: Diabetes   HPI Erin Ortiz presents forFollow-up of diabetes. Patient checks blood sugar at home.   100-120 fasting and  postprandial Patient denies symptoms such as polyuria, polydipsia, excessive hunger, nausea A few mild hypoglycemic spells noted.Usually glucose drops to around 70 if she eats something sweet, or a large amount of food then takes a nap. She will wake up feeling shaky. Not a common occurrence.  Medications reviewed. Pt reports taking them regularly without complication/adverse reaction being reported today.  Checking feet daily   follow-up on  thyroid. The patient has a history of hypothyroidism for many years. It has been stable recently. Pt. denies any change in  voice, loss of hair, heat or cold intolerance. Energy level has been adequate to good. Patient denies constipation and diarrhea. No myxedema. Medication is as noted below. Verified that pt is taking it daily on an empty stomach. Well tolerated.  PT. controlling her rheumatoid disease with 3 mg alternated qod with 4 mg of prednisone. Using long term.    History Erin Ortiz has a past medical history of Anemia, Chronic kidney disease, and Diabetes mellitus without complication ([place]).   She has a past surgical history that includes jejunostomy (05/17/2008); Gastric bypass (11/2001); and Vaginal hysterectomy (12/1986).   Her family history includes Cancer in her father and mother.She reports that she has never smoked. She has never used smokeless tobacco. She reports that she does not drink alcohol or use drugs.  Current Outpatient Medications on File Prior to Visit  Medication Sig Dispense Refill  . Blood Glucose Monitoring Suppl (ONE TOUCH ULTRA SYSTEM KIT) W/DEVICE KIT 1 kit by Does not apply route once. 1 each 0  . Cyanocobalamin 1000 MCG/ML KIT Give 1 ml from one vial and 0.2 ml from second vial monthly 4 kit 3   . glucose blood (ONE TOUCH ULTRA TEST) test strip CHECK GLUCOSE 3 TIMES DAILY 300 each 3  . Multiple Minerals-Vitamins (CALCIUM-MAGNESIUM-ZINC-D3 PO) Take by mouth 3 (three) times daily.    . Pediatric Multiple Vit-C-FA (FLINSTONES GUMMIES OMEGA-3 DHA) CHEW Chew by mouth.    . predniSONE (DELTASONE) 1 MG tablet Take 4 tablets (4 mg total) by mouth daily. 120 tablet 5  . raloxifene (EVISTA) 60 MG tablet Take 1 tablet by mouth once daily 90 tablet 0  . vitamin C (ASCORBIC ACID) 500 MG tablet Take 500 mg by mouth daily.    . Vitamin D, Ergocalciferol, (DRISDOL) 1.25 MG (50000 UT) CAPS capsule Take 1 capsule (50,000 Units total) by mouth every 7 (seven) days. 13 capsule 1   Current Facility-Administered Medications on File Prior to Visit  Medication Dose Route Frequency Provider Last Rate Last Admin  . cyanocobalamin ((VITAMIN B-12)) injection 1,000 mcg  1,000 mcg Intramuscular Q30 days Claretta Fraise, MD   1,000 mcg at 03/15/19 1126    ROS Review of Systems  Constitutional: Negative.   HENT: Negative for congestion.   Eyes: Negative for visual disturbance.  Respiratory: Negative for shortness of breath.   Cardiovascular: Negative for chest pain.  Gastrointestinal: Negative for abdominal pain, constipation, diarrhea, nausea and vomiting.  Genitourinary: Negative for difficulty urinating.  Musculoskeletal: Negative for arthralgias and myalgias.  Neurological: Negative for headaches.  Psychiatric/Behavioral: Negative for sleep disturbance.    Objective:  BP 123/73   Pulse 76   Temp (!) 96 F (35.6 C) (Temporal)   Ht 5' 4"  (1.626  m)   Wt 187 lb 3.2 oz (84.9 kg)   LMP 04/11/1986   SpO2 100%   BMI 32.13 kg/m   BP Readings from Last 3 Encounters:  04/16/19 123/73  01/02/19 128/77  06/15/18 (!) 100/59    Wt Readings from Last 3 Encounters:  04/16/19 187 lb 3.2 oz (84.9 kg)  01/02/19 191 lb (86.6 kg)  06/15/18 182 lb 2 oz (82.6 kg)     Physical Exam Constitutional:       General: She is not in acute distress.    Appearance: She is well-developed.  HENT:     Head: Normocephalic and atraumatic.     Right Ear: External ear normal.     Left Ear: External ear normal.     Nose: Nose normal.  Eyes:     Conjunctiva/sclera: Conjunctivae normal.     Pupils: Pupils are equal, round, and reactive to light.  Neck:     Thyroid: No thyromegaly.  Cardiovascular:     Rate and Rhythm: Normal rate and regular rhythm.     Heart sounds: Normal heart sounds. No murmur.  Pulmonary:     Effort: Pulmonary effort is normal. No respiratory distress.     Breath sounds: Normal breath sounds. No wheezing or rales.  Abdominal:     General: Bowel sounds are normal. There is no distension.     Palpations: Abdomen is soft.     Tenderness: There is no abdominal tenderness.  Musculoskeletal:     Cervical back: Normal range of motion and neck supple.  Lymphadenopathy:     Cervical: No cervical adenopathy.  Skin:    General: Skin is warm and dry.     Findings: Bruising present.  Neurological:     Mental Status: She is alert and oriented to person, place, and time.     Deep Tendon Reflexes: Reflexes are normal and symmetric.  Psychiatric:        Behavior: Behavior normal.        Thought Content: Thought content normal.        Judgment: Judgment normal.       Assessment & Plan:   Erin Ortiz was seen today for diabetes.  Diagnoses and all orders for this visit:  Acquired hypothyroidism -     CBC -     TSH + free T4  Type 2 diabetes mellitus with stage 3 chronic kidney disease, without long-term current use of insulin, unspecified whether stage 3a or 3b CKD ([facility]) -     hgba1c -     CBC -     CMP -     TSH + free T4  Mixed hyperlipidemia -     CMP  Essential (primary) hypertension -     CMP  Anarthritic rheumatoid disease ([facility]) -     CBC -     CMP  Senile purpura ([place])  Other orders -     atorvastatin (LIPITOR) 40 MG tablet; Take 1 tablet (40 mg total) by mouth  daily. -     levothyroxine (SYNTHROID) 112 MCG tablet; Take one tablet every morning 30 minutes before meal -     lisinopril (ZESTRIL) 20 MG tablet; Take 1 tablet (20 mg total) by mouth 2 (two) times daily. -     metFORMIN (GLUCOPHAGE-XR) 750 MG 24 hr tablet; Take 1 tablet (750 mg total) by mouth 2 (two) times daily. -     omeprazole (PRILOSEC) 40 MG capsule; Take 1 capsule (40 mg total) by mouth daily.  I have changed Tashyra Tews's atorvastatin, levothyroxine, lisinopril, and metFORMIN. I am also having her maintain her [place] KIT, Multiple Minerals-Vitamins (CALCIUM-MAGNESIUM-ZINC-D3 PO), Flinstones Gummies Omega-3 DHA, vitamin C, Vitamin D (Ergocalciferol), glucose blood, predniSONE, Cyanocobalamin, raloxifene, and omeprazole. We will continue to administer cyanocobalamin.  Meds ordered this encounter  Medications  . atorvastatin (LIPITOR) 40 MG tablet    Sig: Take 1 tablet (40 mg total) by mouth daily.    Dispense:  90 tablet    Refill:  3  . levothyroxine (SYNTHROID) 112 MCG tablet    Sig: Take one tablet every morning 30 minutes before meal    Dispense:  90 tablet    Refill:  3  . lisinopril (ZESTRIL) 20 MG tablet    Sig: Take 1 tablet (20 mg total) by mouth 2 (two) times daily.    Dispense:  180 tablet    Refill:  1  . metFORMIN (GLUCOPHAGE-XR) 750 MG 24 hr tablet    Sig: Take 1 tablet (750 mg total) by mouth 2 (two) times daily.    Dispense:  90 tablet    Refill:  3  . omeprazole (PRILOSEC) 40 MG capsule    Sig: Take 1 capsule (40 mg total) by mouth daily.    Dispense:  90 capsule    Refill:  1     Follow-up: Return in about 3 months (around 07/15/2019).  Claretta Fraise, M.D.

## 2019-04-16 NOTE — Addendum Note (Signed)
Addended by: Magdalene River on: 04/16/2019 05:04 PM   Modules accepted: Orders

## 2019-04-17 ENCOUNTER — Other Ambulatory Visit: Payer: Self-pay | Admitting: Family Medicine

## 2019-04-17 LAB — CMP14+EGFR
ALT: 24 IU/L (ref 0–32)
AST: 24 IU/L (ref 0–40)
Albumin/Globulin Ratio: 1.7 (ref 1.2–2.2)
Albumin: 3.8 g/dL (ref 3.7–4.7)
Alkaline Phosphatase: 90 IU/L (ref 39–117)
BUN/Creatinine Ratio: 10 — ABNORMAL LOW (ref 12–28)
BUN: 16 mg/dL (ref 8–27)
Bilirubin Total: 0.3 mg/dL (ref 0.0–1.2)
CO2: 21 mmol/L (ref 20–29)
Calcium: 8.7 mg/dL (ref 8.7–10.3)
Chloride: 108 mmol/L — ABNORMAL HIGH (ref 96–106)
Creatinine, Ser: 1.56 mg/dL — ABNORMAL HIGH (ref 0.57–1.00)
GFR calc Af Amer: 37 mL/min/{1.73_m2} — ABNORMAL LOW (ref >59)
GFR calc non Af Amer: 32 mL/min/{1.73_m2} — ABNORMAL LOW (ref >59)
Globulin, Total: 2.3 g/dL (ref 1.5–4.5)
Glucose: 115 mg/dL — ABNORMAL HIGH (ref 65–99)
Potassium: 4.1 mmol/L (ref 3.5–5.2)
Sodium: 142 mmol/L (ref 134–144)
Total Protein: 6.1 g/dL (ref 6.0–8.5)

## 2019-04-17 LAB — CBC WITH DIFFERENTIAL/PLATELET
Basophils Absolute: 0.1 10*3/uL (ref 0.0–0.2)
Basos: 1 %
EOS (ABSOLUTE): 0.1 10*3/uL (ref 0.0–0.4)
Eos: 2 %
Hematocrit: 33 % — ABNORMAL LOW (ref 34.0–46.6)
Hemoglobin: 10.3 g/dL — ABNORMAL LOW (ref 11.1–15.9)
Immature Grans (Abs): 0 10*3/uL (ref 0.0–0.1)
Immature Granulocytes: 0 %
Lymphocytes Absolute: 1.8 10*3/uL (ref 0.7–3.1)
Lymphs: 22 %
MCH: 28.5 pg (ref 26.6–33.0)
MCHC: 31.2 g/dL — ABNORMAL LOW (ref 31.5–35.7)
MCV: 91 fL (ref 79–97)
Monocytes Absolute: 0.6 10*3/uL (ref 0.1–0.9)
Monocytes: 7 %
Neutrophils Absolute: 5.4 10*3/uL (ref 1.4–7.0)
Neutrophils: 68 %
Platelets: 209 10*3/uL (ref 150–450)
RBC: 3.62 x10E6/uL — ABNORMAL LOW (ref 3.77–5.28)
RDW: 13.6 % (ref 11.7–15.4)
WBC: 8 10*3/uL (ref 3.4–10.8)

## 2019-04-17 LAB — TSH+FREE T4
Free T4: 0.93 ng/dL (ref 0.82–1.77)
TSH: 7.37 u[IU]/mL — ABNORMAL HIGH (ref 0.450–4.500)

## 2019-04-17 MED ORDER — LEVOTHYROXINE SODIUM 125 MCG PO TABS: ORAL_TABLET | ORAL | 1 refills | Status: DC

## 2019-04-18 ENCOUNTER — Telehealth: Payer: Self-pay | Admitting: Family Medicine

## 2019-04-18 DIAGNOSIS — E039 Hypothyroidism, unspecified: Secondary | ICD-10-CM

## 2019-04-18 NOTE — Telephone Encounter (Signed)
Reviewed results and recommendations with pt and placed future orders for pt to come have thyroid labs drawn.

## 2019-04-18 NOTE — Telephone Encounter (Signed)
Received missed call from [place]. Requested to be called back.

## 2019-05-29 ENCOUNTER — Other Ambulatory Visit: Payer: Self-pay

## 2019-05-29 ENCOUNTER — Other Ambulatory Visit: Payer: Medicare PPO

## 2019-05-29 DIAGNOSIS — E039 Hypothyroidism, unspecified: Secondary | ICD-10-CM | POA: Diagnosis not present

## 2019-05-30 LAB — TSH: TSH: 0.33 u[IU]/mL — ABNORMAL LOW (ref 0.450–4.500)

## 2019-05-30 LAB — T4, FREE: Free T4: 1.6 ng/dL (ref 0.82–1.77)

## 2019-06-04 ENCOUNTER — Ambulatory Visit: Payer: Medicare PPO | Admitting: Family Medicine

## 2019-06-09 ENCOUNTER — Other Ambulatory Visit: Payer: Self-pay | Admitting: Family Medicine

## 2019-07-13 ENCOUNTER — Other Ambulatory Visit: Payer: Self-pay | Admitting: Family Medicine

## 2019-07-15 ENCOUNTER — Other Ambulatory Visit: Payer: Medicare PPO

## 2019-07-15 ENCOUNTER — Ambulatory Visit: Payer: Medicare PPO | Admitting: Family Medicine

## 2019-07-18 ENCOUNTER — Other Ambulatory Visit: Payer: Medicare PPO

## 2019-07-18 ENCOUNTER — Ambulatory Visit: Payer: Medicare PPO | Admitting: Family Medicine

## 2019-07-24 ENCOUNTER — Ambulatory Visit (INDEPENDENT_AMBULATORY_CARE_PROVIDER_SITE_OTHER): Payer: Medicare PPO

## 2019-07-24 ENCOUNTER — Ambulatory Visit (INDEPENDENT_AMBULATORY_CARE_PROVIDER_SITE_OTHER): Payer: Medicare PPO | Admitting: Family Medicine

## 2019-07-24 ENCOUNTER — Other Ambulatory Visit: Payer: Self-pay

## 2019-07-24 ENCOUNTER — Encounter: Payer: Self-pay | Admitting: Family Medicine

## 2019-07-24 VITALS — BP 123/79 | HR 84 | Temp 96.8°F | Resp 20 | Ht 64.0 in | Wt 181.0 lb

## 2019-07-24 DIAGNOSIS — R6889 Other general symptoms and signs: Secondary | ICD-10-CM | POA: Diagnosis not present

## 2019-07-24 DIAGNOSIS — I10 Essential (primary) hypertension: Secondary | ICD-10-CM | POA: Diagnosis not present

## 2019-07-24 DIAGNOSIS — D509 Iron deficiency anemia, unspecified: Secondary | ICD-10-CM

## 2019-07-24 DIAGNOSIS — E538 Deficiency of other specified B group vitamins: Secondary | ICD-10-CM | POA: Diagnosis not present

## 2019-07-24 DIAGNOSIS — E1122 Type 2 diabetes mellitus with diabetic chronic kidney disease: Secondary | ICD-10-CM | POA: Diagnosis not present

## 2019-07-24 DIAGNOSIS — Z780 Asymptomatic menopausal state: Secondary | ICD-10-CM

## 2019-07-24 DIAGNOSIS — E782 Mixed hyperlipidemia: Secondary | ICD-10-CM | POA: Diagnosis not present

## 2019-07-24 DIAGNOSIS — M85851 Other specified disorders of bone density and structure, right thigh: Secondary | ICD-10-CM | POA: Diagnosis not present

## 2019-07-24 DIAGNOSIS — E559 Vitamin D deficiency, unspecified: Secondary | ICD-10-CM | POA: Diagnosis not present

## 2019-07-24 DIAGNOSIS — Z13820 Encounter for screening for osteoporosis: Secondary | ICD-10-CM

## 2019-07-24 DIAGNOSIS — N183 Chronic kidney disease, stage 3 unspecified: Secondary | ICD-10-CM | POA: Diagnosis not present

## 2019-07-24 DIAGNOSIS — E039 Hypothyroidism, unspecified: Secondary | ICD-10-CM

## 2019-07-24 LAB — BAYER DCA HB A1C WAIVED: HB A1C (BAYER DCA - WAIVED): 6.5 % (ref <7.0)

## 2019-07-24 MED ORDER — CYANOCOBALAMIN 1000 MCG/ML IJ SOLN: 1000.0000 ug | INTRAMUSCULAR | 99 refills | Status: AC

## 2019-07-24 MED ORDER — METFORMIN HCL ER 500 MG PO TB24: 500.0000 mg | ORAL_TABLET | Freq: Two times a day (BID) | ORAL | 1 refills | Status: DC

## 2019-07-24 NOTE — Progress Notes (Signed)
Subjective:  [name], female    DOB: 1946/02/15  Age: 74 y.o. MRN: [id number]  CC: Medical Management of Chronic Issues   HPI Erin Ortiz presents forFollow-up of diabetes. Patient checks blood sugar at home.  Log attached showing readings fasting in the 1 10-1 35 range.  Postprandial range from 72 150.  Several lows in the 60s were noted.  It dropped into the 50s once.  She is able to treat it with taking foods high in glucose. Patient denies symptoms such as polyuria, polydipsia, excessive hunger, nausea Several hypoglycemic spells noted. Make her feel like getting the flu. Usually occur in the afternoon or evening. Medications reviewed. Pt reports taking them regularly without complication/adverse reaction being reported today.   Patient presents for follow-up on  thyroid. The patient has a history of hypothyroidism for many years. It has been stable recently. Pt. denies any change in  voice, loss of hair, heat or cold intolerance. Energy level has been adequate to good. Patient denies constipation and diarrhea. No myxedema. Medication is as noted below. Verified that pt is taking it daily on an empty stomach. Well tolerated.   Follow-up of hypertension. Patient has no history of headache chest pain or shortness of breath or recent cough. Patient also denies symptoms of TIA such as numbness weakness lateralizing. Patient checks  blood pressure at home and has not had any elevated readings recently. Patient denies side effects from his medication. States taking it regularly.   History Erin Ortiz has a past medical history of Anemia, Chronic kidney disease, and Diabetes mellitus without complication ([place]).   She has a past surgical history that includes jejunostomy (05/17/2008); Gastric bypass (11/2001); and Vaginal hysterectomy (12/1986).   Her family history includes Cancer in her father and mother.She reports that she has never smoked. She has never used smokeless tobacco.  She reports that she does not drink alcohol or use drugs.  Current Outpatient Medications on File Prior to Visit  Medication Sig Dispense Refill  . atorvastatin (LIPITOR) 40 MG tablet Take 1 tablet by mouth once daily 30 tablet 0  . Blood Glucose Monitoring Suppl (ONE TOUCH ULTRA SYSTEM KIT) W/DEVICE KIT 1 kit by Does not apply route once. 1 each 0  . Cyanocobalamin 1000 MCG/ML KIT Give 1 ml from one vial and 0.2 ml from second vial monthly 4 kit 3  . glucose blood (ONE TOUCH ULTRA TEST) test strip CHECK GLUCOSE 3 TIMES DAILY 300 each 3  . levothyroxine (SYNTHROID) 125 MCG tablet Take one tablet every morning 30 minutes before meal 90 tablet 1  . lisinopril (ZESTRIL) 20 MG tablet Take 1 tablet (20 mg total) by mouth 2 (two) times daily. 180 tablet 1  . Multiple Minerals-Vitamins (CALCIUM-MAGNESIUM-ZINC-D3 PO) Take by mouth 3 (three) times daily.    Marland Kitchen omeprazole (PRILOSEC) 40 MG capsule Take 1 capsule (40 mg total) by mouth daily. 90 capsule 1  . Pediatric Multiple Vit-C-FA (FLINSTONES GUMMIES OMEGA-3 DHA) CHEW Chew by mouth.    . predniSONE (DELTASONE) 1 MG tablet Take 4 tablets (4 mg total) by mouth daily. 120 tablet 5  . raloxifene (EVISTA) 60 MG tablet Take 1 tablet by mouth once daily 90 tablet 1  . vitamin C (ASCORBIC ACID) 500 MG tablet Take 500 mg by mouth daily.    . Vitamin D, Ergocalciferol, (DRISDOL) 1.25 MG (50000 UT) CAPS capsule Take 1 capsule (50,000 Units total) by mouth every 7 (seven) days. (Patient not taking: Reported on 07/24/2019) 13 capsule  1   Current Facility-Administered Medications on File Prior to Visit  Medication Dose Route Frequency Provider Last Rate Last Admin  . cyanocobalamin ((VITAMIN B-12)) injection 1,000 mcg  1,000 mcg Intramuscular Q30 days Claretta Fraise, MD   1,000 mcg at 04/16/19 1011    ROS Review of Systems  Constitutional: Negative.   HENT: Negative for congestion.   Eyes: Negative for visual disturbance.  Respiratory: Negative for shortness of  breath.   Cardiovascular: Negative for chest pain.  Gastrointestinal: Negative for abdominal pain, constipation, diarrhea, nausea and vomiting.  Genitourinary: Negative for difficulty urinating.  Musculoskeletal: Positive for arthralgias. Negative for myalgias.  Neurological: Negative for headaches.  Psychiatric/Behavioral: Negative for sleep disturbance.    Objective:  BP 123/79   Pulse 84   Temp (!) 96.8 F (36 C) (Temporal)   Resp 20   Ht 5' 4"  (1.626 m)   Wt 181 lb (82.1 kg)   LMP 04/11/1986   SpO2 98%   BMI 31.07 kg/m   BP Readings from Last 3 Encounters:  07/24/19 123/79  04/16/19 123/73  01/02/19 128/77    Wt Readings from Last 3 Encounters:  07/24/19 181 lb (82.1 kg)  04/16/19 187 lb 3.2 oz (84.9 kg)  01/02/19 191 lb (86.6 kg)     Physical Exam Constitutional:      General: She is not in acute distress.    Appearance: She is well-developed.  HENT:     Head: Normocephalic and atraumatic.     Right Ear: External ear normal.     Left Ear: External ear normal.     Nose: Nose normal.  Eyes:     Conjunctiva/sclera: Conjunctivae normal.     Pupils: Pupils are equal, round, and reactive to light.  Neck:     Thyroid: No thyromegaly.  Cardiovascular:     Rate and Rhythm: Normal rate and regular rhythm.     Heart sounds: Normal heart sounds. No murmur.  Pulmonary:     Effort: Pulmonary effort is normal. No respiratory distress.     Breath sounds: Normal breath sounds. No wheezing or rales.  Abdominal:     General: Bowel sounds are normal. There is no distension.     Palpations: Abdomen is soft.     Tenderness: There is no abdominal tenderness.  Musculoskeletal:     Cervical back: Normal range of motion and neck supple.  Lymphadenopathy:     Cervical: No cervical adenopathy.  Skin:    General: Skin is warm and dry.  Neurological:     Mental Status: She is alert and oriented to person, place, and time.     Deep Tendon Reflexes: Reflexes are normal and  symmetric.  Psychiatric:        Behavior: Behavior normal.        Thought Content: Thought content normal.        Judgment: Judgment normal.       Assessment & Plan:   Erin Ortiz was seen today for medical management of chronic issues.  Diagnoses and all orders for this visit:  Type 2 diabetes mellitus with stage 3 chronic kidney disease, without long-term current use of insulin, unspecified whether stage 3a or 3b CKD (HCC) -     Bayer DCA Hb A1c Waived -     CBC with Differential/Platelet -     CMP14+EGFR -     Lipid panel -     metFORMIN (GLUCOPHAGE-XR) 500 MG 24 hr tablet; Take 1 tablet (500 mg total) by mouth 2 (  two) times daily.  Acquired hypothyroidism -     CBC with Differential/Platelet -     CMP14+EGFR -     Lipid panel -     Thyroid Panel With TSH  Mixed hyperlipidemia -     CBC with Differential/Platelet -     CMP14+EGFR -     Lipid panel  Essential (primary) hypertension -     CBC with Differential/Platelet -     CMP14+EGFR -     Lipid panel  Iron deficiency anemia, unspecified iron deficiency anemia type  B12 deficiency -     cyanocobalamin (,VITAMIN B-12,) 1000 MCG/ML injection; Inject 1 mL (1,000 mcg total) into the skin every 30 (thirty) days. -     Vitamin B12 -     Vitamin B12 -     Specimen status report  Avitaminosis D -     VITAMIN D 25 Hydroxy (Vit-D Deficiency, Fractures) -     VITAMIN D 25 Hydroxy (Vit-D Deficiency, Fractures)   Dexa Today.   I have changed Erin Ortiz's metFORMIN. I am also having her start on cyanocobalamin. Additionally, I am having her maintain her [place] KIT, Multiple Minerals-Vitamins (CALCIUM-MAGNESIUM-ZINC-D3 PO), Flinstones Gummies Omega-3 DHA, vitamin C, Vitamin D (Ergocalciferol), glucose blood, predniSONE, Cyanocobalamin, lisinopril, omeprazole, levothyroxine, raloxifene, and atorvastatin. We will continue to administer cyanocobalamin.  Meds ordered this encounter  Medications  . metFORMIN  (GLUCOPHAGE-XR) 500 MG 24 hr tablet    Sig: Take 1 tablet (500 mg total) by mouth 2 (two) times daily.    Dispense:  90 tablet    Refill:  1  . cyanocobalamin (,VITAMIN B-12,) 1000 MCG/ML injection    Sig: Inject 1 mL (1,000 mcg total) into the skin every 30 (thirty) days.    Dispense:  10 mL    Refill:  prn     Follow-up: Return in about 3 months (around 10/23/2019).  Claretta Fraise, M.D.

## 2019-07-25 LAB — CMP14+EGFR
ALT: 17 IU/L (ref 0–32)
AST: 20 IU/L (ref 0–40)
Albumin/Globulin Ratio: 1.9 (ref 1.2–2.2)
Albumin: 4.1 g/dL (ref 3.7–4.7)
Alkaline Phosphatase: 71 IU/L (ref 39–117)
BUN/Creatinine Ratio: 15 (ref 12–28)
BUN: 26 mg/dL (ref 8–27)
Bilirubin Total: 0.3 mg/dL (ref 0.0–1.2)
CO2: 20 mmol/L (ref 20–29)
Calcium: 8.7 mg/dL (ref 8.7–10.3)
Chloride: 109 mmol/L — ABNORMAL HIGH (ref 96–106)
Creatinine, Ser: 1.69 mg/dL — ABNORMAL HIGH (ref 0.57–1.00)
GFR calc Af Amer: 34 mL/min/{1.73_m2} — ABNORMAL LOW (ref >59)
GFR calc non Af Amer: 29 mL/min/{1.73_m2} — ABNORMAL LOW (ref >59)
Globulin, Total: 2.2 g/dL (ref 1.5–4.5)
Glucose: 90 mg/dL (ref 65–99)
Potassium: 4.3 mmol/L (ref 3.5–5.2)
Sodium: 142 mmol/L (ref 134–144)
Total Protein: 6.3 g/dL (ref 6.0–8.5)

## 2019-07-25 LAB — CBC WITH DIFFERENTIAL/PLATELET
Basophils Absolute: 0.1 10*3/uL (ref 0.0–0.2)
Basos: 1 %
EOS (ABSOLUTE): 0.1 10*3/uL (ref 0.0–0.4)
Eos: 1 %
Hematocrit: 30.8 % — ABNORMAL LOW (ref 34.0–46.6)
Hemoglobin: 10 g/dL — ABNORMAL LOW (ref 11.1–15.9)
Immature Grans (Abs): 0 10*3/uL (ref 0.0–0.1)
Immature Granulocytes: 0 %
Lymphocytes Absolute: 1.7 10*3/uL (ref 0.7–3.1)
Lymphs: 21 %
MCH: 29.6 pg (ref 26.6–33.0)
MCHC: 32.5 g/dL (ref 31.5–35.7)
MCV: 91 fL (ref 79–97)
Monocytes Absolute: 0.6 10*3/uL (ref 0.1–0.9)
Monocytes: 7 %
Neutrophils Absolute: 5.6 10*3/uL (ref 1.4–7.0)
Neutrophils: 70 %
Platelets: 241 10*3/uL (ref 150–450)
RBC: 3.38 x10E6/uL — ABNORMAL LOW (ref 3.77–5.28)
RDW: 13.5 % (ref 11.7–15.4)
WBC: 8 10*3/uL (ref 3.4–10.8)

## 2019-07-25 LAB — LIPID PANEL
Chol/HDL Ratio: 2.7 ratio (ref 0.0–4.4)
Cholesterol, Total: 133 mg/dL (ref 100–199)
HDL: 50 mg/dL (ref >39)
LDL Chol Calc (NIH): 68 mg/dL (ref 0–99)
Triglycerides: 74 mg/dL (ref 0–149)
VLDL Cholesterol Cal: 15 mg/dL (ref 5–40)

## 2019-07-25 LAB — THYROID PANEL WITH TSH
Free Thyroxine Index: 2.2 (ref 1.2–4.9)
T3 Uptake Ratio: 30 % (ref 24–39)
T4, Total: 7.4 ug/dL (ref 4.5–12.0)
TSH: 0.176 u[IU]/mL — ABNORMAL LOW (ref 0.450–4.500)

## 2019-07-26 LAB — VITAMIN D 25 HYDROXY (VIT D DEFICIENCY, FRACTURES): Vit D, 25-Hydroxy: 40.8 ng/mL (ref 30.0–100.0)

## 2019-07-26 LAB — SPECIMEN STATUS REPORT

## 2019-07-26 LAB — VITAMIN B12: Vitamin B-12: 1282 pg/mL — ABNORMAL HIGH (ref 232–1245)

## 2019-08-06 DIAGNOSIS — Z1231 Encounter for screening mammogram for malignant neoplasm of breast: Secondary | ICD-10-CM | POA: Diagnosis not present

## 2019-08-11 ENCOUNTER — Other Ambulatory Visit: Payer: Self-pay | Admitting: Family Medicine

## 2019-08-15 ENCOUNTER — Other Ambulatory Visit: Payer: Self-pay | Admitting: Family Medicine

## 2019-08-15 MED ORDER — PREDNISONE 1 MG PO TABS: 4.0000 mg | ORAL_TABLET | Freq: Every day | ORAL | 5 refills | Status: DC

## 2019-08-15 NOTE — Telephone Encounter (Signed)
Please advise, pt states prednisone is for arthritis

## 2019-08-15 NOTE — Telephone Encounter (Signed)
  Prescription Request  08/15/2019  What is the name of the medication or equipment? Prednisone 1 mg  Have you contacted your pharmacy to request a refill? (if applicable) Yes  Which pharmacy would you like this sent to? [facility] in [place]   Patient notified that their request is being sent to the clinical staff for review and that they should receive a response within 2 business days.

## 2019-08-19 ENCOUNTER — Ambulatory Visit (INDEPENDENT_AMBULATORY_CARE_PROVIDER_SITE_OTHER): Payer: Medicare PPO | Admitting: *Deleted

## 2019-08-19 ENCOUNTER — Other Ambulatory Visit: Payer: Self-pay

## 2019-08-19 DIAGNOSIS — E538 Deficiency of other specified B group vitamins: Secondary | ICD-10-CM

## 2019-08-19 MED ORDER — CYANOCOBALAMIN 1000 MCG/ML IJ SOLN
200.0000 ug | INTRAMUSCULAR | Status: DC
  Administered 2019-08-19: 200 ug via INTRAMUSCULAR

## 2019-08-19 MED ORDER — CYANOCOBALAMIN 1000 MCG/ML IJ SOLN: 200.0000 ug | INTRAMUSCULAR | Status: DC

## 2019-08-19 NOTE — Progress Notes (Signed)
 B12 given- Right Deltoid Tolerated Well Next appt made

## 2019-09-20 ENCOUNTER — Ambulatory Visit (INDEPENDENT_AMBULATORY_CARE_PROVIDER_SITE_OTHER): Payer: Medicare PPO

## 2019-09-20 ENCOUNTER — Other Ambulatory Visit: Payer: Self-pay

## 2019-09-20 DIAGNOSIS — E538 Deficiency of other specified B group vitamins: Secondary | ICD-10-CM

## 2019-09-20 NOTE — Progress Notes (Signed)
Cyanocobalamin 1200 mcg given to left deltoid.  Patient tolerated well.

## 2019-09-23 ENCOUNTER — Other Ambulatory Visit: Payer: Self-pay | Admitting: Family Medicine

## 2019-10-23 ENCOUNTER — Ambulatory Visit: Payer: Medicare PPO | Admitting: Family Medicine

## 2019-10-23 ENCOUNTER — Other Ambulatory Visit: Payer: Self-pay

## 2019-10-23 ENCOUNTER — Encounter: Payer: Self-pay | Admitting: Family Medicine

## 2019-10-23 VITALS — BP 133/77 | HR 82 | Temp 97.1°F | Resp 20 | Ht 64.0 in | Wt 181.0 lb

## 2019-10-23 DIAGNOSIS — E039 Hypothyroidism, unspecified: Secondary | ICD-10-CM

## 2019-10-23 DIAGNOSIS — N183 Chronic kidney disease, stage 3 unspecified: Secondary | ICD-10-CM | POA: Diagnosis not present

## 2019-10-23 DIAGNOSIS — E782 Mixed hyperlipidemia: Secondary | ICD-10-CM

## 2019-10-23 DIAGNOSIS — E538 Deficiency of other specified B group vitamins: Secondary | ICD-10-CM

## 2019-10-23 DIAGNOSIS — E1122 Type 2 diabetes mellitus with diabetic chronic kidney disease: Secondary | ICD-10-CM

## 2019-10-23 DIAGNOSIS — I10 Essential (primary) hypertension: Secondary | ICD-10-CM | POA: Diagnosis not present

## 2019-10-23 LAB — BAYER DCA HB A1C WAIVED: HB A1C (BAYER DCA - WAIVED): 5.9 %

## 2019-10-23 NOTE — Progress Notes (Signed)
Subjective:  [name],  female    DOB: Jan 10, 1946  Age: 74 y.o.    CC: Medical Management of Chronic Issues   HPI Erin Ortiz presents for  follow-up of hypertension. Patient has no history of headache chest pain or shortness of breath or recent cough. Patient also denies symptoms of TIA such as numbness weakness lateralizing. Patient denies side effects from medication. States taking it regularly.  Patient also  in for follow-up of elevated cholesterol. Doing well without complaints on current medication. Denies side effects  including myalgia and arthralgia and nausea. Also in today for liver function testing. Currently no chest pain, shortness of breath or other cardiovascular related symptoms noted.  Follow-up of diabetes. Patient does check blood sugar at home. Readings run between 109-120s.  She is exercising walking and doing yard work. Patient denies symptoms such as excessive hunger or urinary frequency, excessive hunger, nausea No significant hypoglycemic spells noted. Medications reviewed. Pt reports taking them regularly. Pt. denies complication/adverse reaction today.    History Erin Ortiz has a past medical history of Anemia, Chronic kidney disease, and Diabetes mellitus without complication ([place]).   She has a past surgical history that includes jejunostomy (05/17/2008); Gastric bypass (11/2001); and Vaginal hysterectomy (12/1986).   Her family history includes Cancer in her father and mother.She reports that she has never smoked. She has never used smokeless tobacco. She reports that she does not drink alcohol and does not use drugs.  Current Outpatient Medications on File Prior to Visit  Medication Sig Dispense Refill  . atorvastatin (LIPITOR) 40 MG tablet Take 1 tablet by mouth once daily 30 tablet 5  . Blood Glucose Monitoring Suppl (ONE TOUCH ULTRA SYSTEM KIT) W/DEVICE KIT 1 kit by Does not apply route once. 1 each 0  . cyanocobalamin (,VITAMIN B-12,)  1000 MCG/ML injection Inject 1 mL (1,000 mcg total) into the skin every 30 (thirty) days. 10 mL prn  . Cyanocobalamin 1000 MCG/ML KIT Give 1 ml from one vial and 0.2 ml from second vial monthly 4 kit 3  . EUTHYROX 125 MCG tablet TAKE 1 TABLET BY MOUTH IN THE MORNING 30  MINUTES  BEFORE  MEAL 90 tablet 1  . glucose blood (ONE TOUCH ULTRA TEST) test strip CHECK GLUCOSE 3 TIMES DAILY 300 each 3  . lisinopril (ZESTRIL) 20 MG tablet Take 1 tablet (20 mg total) by mouth 2 (two) times daily. 180 tablet 1  . metFORMIN (GLUCOPHAGE-XR) 500 MG 24 hr tablet Take 1 tablet (500 mg total) by mouth 2 (two) times daily. 90 tablet 1  . Multiple Minerals-Vitamins (CALCIUM-MAGNESIUM-ZINC-D3 PO) Take by mouth 3 (three) times daily.    Marland Kitchen omeprazole (PRILOSEC) 40 MG capsule Take 1 capsule (40 mg total) by mouth daily. 90 capsule 1  . Pediatric Multiple Vit-C-FA (FLINSTONES GUMMIES OMEGA-3 DHA) CHEW Chew by mouth.    . predniSONE (DELTASONE) 1 MG tablet Take 4 tablets (4 mg total) by mouth daily. 120 tablet 5  . raloxifene (EVISTA) 60 MG tablet Take 1 tablet by mouth once daily 90 tablet 1  . vitamin C (ASCORBIC ACID) 500 MG tablet Take 500 mg by mouth daily.     Current Facility-Administered Medications on File Prior to Visit  Medication Dose Route Frequency Provider Last Rate Last Admin  . cyanocobalamin ((VITAMIN B-12)) injection 1,000 mcg  1,000 mcg Intramuscular Q30 days Claretta Fraise, MD   1,000 mcg at 10/23/19 1002    ROS Review of Systems  Constitutional: Negative.  HENT: Negative.   Eyes: Negative for visual disturbance.  Respiratory: Negative for shortness of breath.   Cardiovascular: Negative for chest pain.  Gastrointestinal: Negative for abdominal pain.  Musculoskeletal: Positive for arthralgias (Right hip.  She tried reducing her prednisone and this seems tobe the result).    Objective:  BP 133/77   Pulse 82   Temp (!) 97.1 F (36.2 C) (Temporal)   Resp 20   Ht 5' 4"  (1.626 m)   Wt 181 lb  (82.1 kg)   LMP 04/11/1986   SpO2 99%   BMI 31.07 kg/m   BP Readings from Last 3 Encounters:  10/23/19 133/77  07/24/19 123/79  04/16/19 123/73    Wt Readings from Last 3 Encounters:  10/23/19 181 lb (82.1 kg)  07/24/19 181 lb (82.1 kg)  04/16/19 187 lb 3.2 oz (84.9 kg)     Physical Exam Constitutional:      General: She is not in acute distress.    Appearance: She is well-developed.  HENT:     Head: Normocephalic and atraumatic.  Eyes:     Conjunctiva/sclera: Conjunctivae normal.     Pupils: Pupils are equal, round, and reactive to light.  Neck:     Thyroid: No thyromegaly.  Cardiovascular:     Rate and Rhythm: Normal rate and regular rhythm.     Heart sounds: Normal heart sounds. No murmur heard.   Pulmonary:     Effort: Pulmonary effort is normal. No respiratory distress.     Breath sounds: Normal breath sounds. No wheezing or rales.  Musculoskeletal:        General: Normal range of motion.     Cervical back: Normal range of motion and neck supple.  Lymphadenopathy:     Cervical: No cervical adenopathy.  Skin:    General: Skin is warm and dry.  Neurological:     Mental Status: She is alert and oriented to person, place, and time.  Psychiatric:        Behavior: Behavior normal.        Thought Content: Thought content normal.        Judgment: Judgment normal.     Diabetic Foot Exam - Simple   Simple Foot Form Visual Inspection No deformities, no ulcerations, no other skin breakdown bilaterally: Yes Sensation Testing Intact to touch and monofilament testing bilaterally: Yes Pulse Check Posterior Tibialis and Dorsalis pulse intact bilaterally: Yes Comments       Assessment & Plan:   Erin Ortiz was seen today for medical management of chronic issues.  Diagnoses and all orders for this visit:  Acquired hypothyroidism -     CBC with Differential/Platelet -     CMP14+EGFR -     Lipid panel -     Thyroid Panel With TSH  Type 2 diabetes mellitus with  stage 3 chronic kidney disease, without long-term current use of insulin, unspecified whether stage 3a or 3b CKD (HCC) -     Bayer DCA Hb A1c Waived -     CBC with Differential/Platelet -     CMP14+EGFR -     Lipid panel  Mixed hyperlipidemia -     CBC with Differential/Platelet -     CMP14+EGFR -     Lipid panel  Essential (primary) hypertension -     CBC with Differential/Platelet -     CMP14+EGFR -     Lipid panel  B12 deficiency   I have discontinued Chesni Anspach's Vitamin D (Ergocalciferol). I am also having her  maintain her ONE TOUCH ULTRA SYSTEM KIT, Multiple Minerals-Vitamins (CALCIUM-MAGNESIUM-ZINC-D3 PO), Flinstones Gummies Omega-3 DHA, vitamin C, glucose blood, Cyanocobalamin, lisinopril, omeprazole, raloxifene, metFORMIN, cyanocobalamin, atorvastatin, predniSONE, and Euthyrox. We administered cyanocobalamin. We will continue to administer cyanocobalamin.  No orders of the defined types were placed in this encounter.    Follow-up: Return in about 3 months (around 01/23/2020).  Claretta Fraise, M.D.

## 2019-10-23 NOTE — Progress Notes (Signed)
B12 injection given to right deltoid.  Patient tolerated well. 

## 2019-10-24 LAB — CMP14+EGFR
ALT: 13 IU/L (ref 0–32)
AST: 16 IU/L (ref 0–40)
Albumin/Globulin Ratio: 1.5 (ref 1.2–2.2)
Albumin: 3.8 g/dL (ref 3.7–4.7)
Alkaline Phosphatase: 83 IU/L (ref 48–121)
BUN/Creatinine Ratio: 21 (ref 12–28)
BUN: 32 mg/dL — ABNORMAL HIGH (ref 8–27)
Bilirubin Total: 0.3 mg/dL (ref 0.0–1.2)
CO2: 19 mmol/L — ABNORMAL LOW (ref 20–29)
Calcium: 8.5 mg/dL — ABNORMAL LOW (ref 8.7–10.3)
Chloride: 107 mmol/L — ABNORMAL HIGH (ref 96–106)
Creatinine, Ser: 1.52 mg/dL — ABNORMAL HIGH (ref 0.57–1.00)
GFR calc Af Amer: 39 mL/min/{1.73_m2} — ABNORMAL LOW (ref >59)
GFR calc non Af Amer: 34 mL/min/{1.73_m2} — ABNORMAL LOW (ref >59)
Globulin, Total: 2.6 g/dL (ref 1.5–4.5)
Glucose: 116 mg/dL — ABNORMAL HIGH (ref 65–99)
Potassium: 5 mmol/L (ref 3.5–5.2)
Sodium: 140 mmol/L (ref 134–144)
Total Protein: 6.4 g/dL (ref 6.0–8.5)

## 2019-10-24 LAB — CBC WITH DIFFERENTIAL/PLATELET
Basophils Absolute: 0.1 10*3/uL (ref 0.0–0.2)
Basos: 1 %
EOS (ABSOLUTE): 0.1 10*3/uL (ref 0.0–0.4)
Eos: 2 %
Hematocrit: 32.1 % — ABNORMAL LOW (ref 34.0–46.6)
Hemoglobin: 10.3 g/dL — ABNORMAL LOW (ref 11.1–15.9)
Immature Grans (Abs): 0.1 10*3/uL (ref 0.0–0.1)
Immature Granulocytes: 1 %
Lymphocytes Absolute: 1.4 10*3/uL (ref 0.7–3.1)
Lymphs: 16 %
MCH: 29 pg (ref 26.6–33.0)
MCHC: 32.1 g/dL (ref 31.5–35.7)
MCV: 90 fL (ref 79–97)
Monocytes Absolute: 0.6 10*3/uL (ref 0.1–0.9)
Monocytes: 7 %
Neutrophils Absolute: 6.1 10*3/uL (ref 1.4–7.0)
Neutrophils: 73 %
Platelets: 215 10*3/uL (ref 150–450)
RBC: 3.55 x10E6/uL — ABNORMAL LOW (ref 3.77–5.28)
RDW: 13.2 % (ref 11.7–15.4)
WBC: 8.3 10*3/uL (ref 3.4–10.8)

## 2019-10-24 LAB — LIPID PANEL
Chol/HDL Ratio: 2.9 ratio (ref 0.0–4.4)
Cholesterol, Total: 152 mg/dL (ref 100–199)
HDL: 52 mg/dL (ref >39)
LDL Chol Calc (NIH): 82 mg/dL (ref 0–99)
Triglycerides: 98 mg/dL (ref 0–149)
VLDL Cholesterol Cal: 18 mg/dL (ref 5–40)

## 2019-10-24 LAB — THYROID PANEL WITH TSH
Free Thyroxine Index: 2.5 (ref 1.2–4.9)
T3 Uptake Ratio: 32 % (ref 24–39)
T4, Total: 7.8 ug/dL (ref 4.5–12.0)
TSH: 0.263 u[IU]/mL — ABNORMAL LOW (ref 0.450–4.500)

## 2019-10-24 NOTE — Progress Notes (Signed)
Hello Symphany,  Your lab result is normal and/or stable.Some minor variations that are not significant are commonly marked abnormal, but do not represent any medical problem for you.  Best regards, Ilaisaane Marts, M.D.

## 2019-10-26 ENCOUNTER — Other Ambulatory Visit: Payer: Self-pay | Admitting: Family Medicine

## 2019-10-27 ENCOUNTER — Encounter: Payer: Self-pay | Admitting: Family Medicine

## 2019-11-07 ENCOUNTER — Encounter: Payer: Self-pay | Admitting: Family Medicine

## 2019-11-07 ENCOUNTER — Other Ambulatory Visit: Payer: Self-pay

## 2019-11-07 ENCOUNTER — Ambulatory Visit: Payer: Medicare PPO | Admitting: Family Medicine

## 2019-11-07 VITALS — BP 133/86 | HR 81 | Temp 98.1°F | Ht 64.0 in | Wt 179.8 lb

## 2019-11-07 DIAGNOSIS — M7918 Myalgia, other site: Secondary | ICD-10-CM | POA: Diagnosis not present

## 2019-11-07 DIAGNOSIS — M069 Rheumatoid arthritis, unspecified: Secondary | ICD-10-CM

## 2019-11-07 DIAGNOSIS — R0789 Other chest pain: Secondary | ICD-10-CM

## 2019-11-07 MED ORDER — BETAMETHASONE SOD PHOS & ACET 6 (3-3) MG/ML IJ SUSP: 6.0000 mg | Freq: Once | INTRAMUSCULAR | Status: DC

## 2019-11-07 NOTE — Progress Notes (Signed)
 Subjective:  [name], female    DOB: 11/27/1945  Age: 74 y.o. MRN: [id number]  CC: Arthritis and Breast Pain   HPI Sherryn Sirmon presents for patient states that she is having exacerbation of her rheumatoid arthritis Been taking 4 mg of the prednisone daily even though three is usually enough.  In spite of that she has bilateral shoulder and neck pain.  Her biggest concern is that she started having swelling in her breasts with a dull ache bilaterally.  She says that she has a numbness and tingling at the bra line on the back.  She does not see a rheumatologist anymore.  She has been doing so well and so stable on the current dose.  Depression screen PHQ 2/9 11/07/2019 10/23/2019 07/24/2019  Decreased Interest 0 0 0  Down, Depressed, Hopeless 0 0 0  PHQ - 2 Score 0 0 0    History Tiani has a past medical history of Anemia, Chronic kidney disease, and Diabetes mellitus without complication (HCC).   She has a past surgical history that includes jejunostomy (05/17/2008); Gastric bypass (11/2001); and Vaginal hysterectomy (12/1986).   Her family history includes Cancer in her father and mother.She reports that she has never smoked. She has never used smokeless tobacco. She reports that she does not drink alcohol and does not use drugs.    ROS Review of Systems  Constitutional: Negative.   HENT: Negative.   Eyes: Negative for visual disturbance.  Respiratory: Negative for shortness of breath.   Cardiovascular: Negative for chest pain.  Gastrointestinal: Negative for abdominal pain.  Musculoskeletal: Positive for arthralgias, back pain and neck pain.    Objective:  BP (!) 133/86   Pulse 81   Temp 98.1 F (36.7 C) (Temporal)   Ht 5' 4" (1.626 m)   Wt 179 lb 12.8 oz (81.6 kg)   LMP 04/11/1986   BMI 30.86 kg/m   BP Readings from Last 3 Encounters:  11/07/19 (!) 133/86  10/23/19 133/77  07/24/19 123/79    Wt Readings from Last 3 Encounters:  11/07/19 179 lb  12.8 oz (81.6 kg)  10/23/19 181 lb (82.1 kg)  07/24/19 181 lb (82.1 kg)     Physical Exam Constitutional:      Appearance: She is well-developed.  HENT:     Head: Normocephalic.  Cardiovascular:     Rate and Rhythm: Normal rate and regular rhythm.     Heart sounds: No murmur heard.   Pulmonary:     Effort: Pulmonary effort is normal.     Breath sounds: Normal breath sounds.  Chest:     Chest wall: Tenderness (Mild and diffuse underneath the breasts and superior to the right breast) present. No mass or deformity.     Breasts:        Right: Tenderness present. No inverted nipple, mass or skin change.        Left: Tenderness present. No inverted nipple, mass or skin change.  Musculoskeletal:        General: Tenderness (At the posterior neck and the base of the neck and across the upper shoulders.) present.  Lymphadenopathy:     Upper Body:     Right upper body: No supraclavicular or axillary adenopathy.     Left upper body: No supraclavicular or axillary adenopathy.       Assessment & Plan:   Marcena was seen today for arthritis and breast pain.  Diagnoses and all orders for this visit:  Rheumatoid arthritis involving   both shoulders, unspecified whether rheumatoid factor present (HCC) -     betamethasone acetate-betamethasone sodium phosphate (CELESTONE) injection 6 mg  Myalgia of muscle of neck -     betamethasone acetate-betamethasone sodium phosphate (CELESTONE) injection 6 mg  Chest wall pain -     betamethasone acetate-betamethasone sodium phosphate (CELESTONE) injection 6 mg       I am having Karlye Eick maintain her ONE TOUCH ULTRA SYSTEM KIT, Multiple Minerals-Vitamins (CALCIUM-MAGNESIUM-ZINC-D3 PO), Flinstones Gummies Omega-3 DHA, vitamin C, glucose blood, Cyanocobalamin, lisinopril, raloxifene, metFORMIN, cyanocobalamin, atorvastatin, predniSONE, Euthyrox, and omeprazole. We will continue to administer cyanocobalamin and betamethasone  acetate-betamethasone sodium phosphate.  Allergies as of 11/07/2019      Reactions   Januvia [sitagliptin] Nausea Only      Medication List       Accurate as of November 07, 2019  9:22 PM. If you have any questions, ask your nurse or doctor.        atorvastatin 40 MG tablet Commonly known as: LIPITOR Take 1 tablet by mouth once daily   CALCIUM-MAGNESIUM-ZINC-D3 PO Take by mouth 3 (three) times daily.   Cyanocobalamin 1000 MCG/ML Kit Give 1 ml from one vial and 0.2 ml from second vial monthly   cyanocobalamin 1000 MCG/ML injection Commonly known as: (VITAMIN B-12) Inject 1 mL (1,000 mcg total) into the skin every 30 (thirty) days.   Euthyrox 125 MCG tablet Generic drug: levothyroxine TAKE 1 TABLET BY MOUTH IN THE MORNING 30  MINUTES  BEFORE  MEAL   [place] Gummies Omega-3 DHA Chew Chew by mouth.   glucose blood test strip Commonly known as: ONE TOUCH ULTRA TEST CHECK GLUCOSE 3 TIMES DAILY   lisinopril 20 MG tablet Commonly known as: ZESTRIL Take 1 tablet (20 mg total) by mouth 2 (two) times daily.   metFORMIN 500 MG 24 hr tablet Commonly known as: GLUCOPHAGE-XR Take 1 tablet (500 mg total) by mouth 2 (two) times daily.   omeprazole 40 MG capsule Commonly known as: PRILOSEC Take 1 capsule by mouth once daily   ONE TOUCH ULTRA SYSTEM KIT w/Device Kit 1 kit by Does not apply route once.   predniSONE 1 MG tablet Commonly known as: DELTASONE Take 4 tablets (4 mg total) by mouth daily.   raloxifene 60 MG tablet Commonly known as: EVISTA Take 1 tablet by mouth once daily   vitamin C 500 MG tablet Commonly known as: ASCORBIC ACID Take 500 mg by mouth daily.        Follow-up: No follow-ups on file.  Warren Stacks, M.D. 

## 2019-11-25 ENCOUNTER — Other Ambulatory Visit: Payer: Self-pay | Admitting: Family Medicine

## 2019-11-25 ENCOUNTER — Ambulatory Visit: Payer: Medicare PPO

## 2019-11-25 DIAGNOSIS — H16251 Phlyctenular keratoconjunctivitis, right eye: Secondary | ICD-10-CM | POA: Diagnosis not present

## 2019-11-25 DIAGNOSIS — E1122 Type 2 diabetes mellitus with diabetic chronic kidney disease: Secondary | ICD-10-CM

## 2019-11-26 ENCOUNTER — Other Ambulatory Visit: Payer: Self-pay

## 2019-11-26 ENCOUNTER — Ambulatory Visit (INDEPENDENT_AMBULATORY_CARE_PROVIDER_SITE_OTHER): Payer: Medicare PPO | Admitting: *Deleted

## 2019-11-26 DIAGNOSIS — E538 Deficiency of other specified B group vitamins: Secondary | ICD-10-CM

## 2019-11-26 NOTE — Progress Notes (Signed)
Vitamin b12 injection given in left deltoid. Patient states she is supposed to be getting Cyanocobalamin 1000 MCG/ML KIT [[id number]]   Order Details Dose, Route, Frequency: As Directed  Dispense Quantity: 4 kit Refills: 3   Indications of Use: Vitamin B12 Deficiency, Pernicious Anemia      Sig: Give 1 ml from one vial and 0.2 ml from second vial monthly   I spoke with Dr. Livia Snellen and the order we have was for only 1021mg (111m monthly he reviewed patients chart and states Vitamin b12 level was elevated at last check and to give patient 1 ml. Patient will have B12 level repeated in 2 weeks. Patient aware and verbalizes understanding.

## 2019-12-09 ENCOUNTER — Other Ambulatory Visit: Payer: Medicare PPO

## 2019-12-09 ENCOUNTER — Other Ambulatory Visit: Payer: Self-pay

## 2019-12-09 DIAGNOSIS — E538 Deficiency of other specified B group vitamins: Secondary | ICD-10-CM | POA: Diagnosis not present

## 2019-12-10 LAB — VITAMIN B12: Vitamin B-12: 1004 pg/mL (ref 232–1245)

## 2019-12-15 ENCOUNTER — Other Ambulatory Visit: Payer: Self-pay | Admitting: Family Medicine

## 2019-12-16 ENCOUNTER — Other Ambulatory Visit: Payer: Self-pay | Admitting: Family Medicine

## 2019-12-17 ENCOUNTER — Telehealth: Payer: Self-pay | Admitting: Family Medicine

## 2019-12-17 ENCOUNTER — Other Ambulatory Visit: Payer: Self-pay | Admitting: Family Medicine

## 2019-12-17 MED ORDER — GLUCOSE BLOOD VI STRP: ORAL_STRIP | 0 refills | Status: DC

## 2019-12-17 NOTE — Telephone Encounter (Signed)
[employer] calling to request One Touch Ultra test strips to be sent in to [facility] for the pt. Pt has only enough to last today.

## 2019-12-31 ENCOUNTER — Other Ambulatory Visit: Payer: Self-pay

## 2019-12-31 ENCOUNTER — Ambulatory Visit (INDEPENDENT_AMBULATORY_CARE_PROVIDER_SITE_OTHER): Payer: Medicare PPO | Admitting: *Deleted

## 2019-12-31 DIAGNOSIS — E538 Deficiency of other specified B group vitamins: Secondary | ICD-10-CM | POA: Diagnosis not present

## 2019-12-31 NOTE — Progress Notes (Signed)
Patient in today for monthly B 12 injection. 1000 mcg given IM in right deltoid. Patient tolerated well.  

## 2020-01-18 ENCOUNTER — Other Ambulatory Visit: Payer: Self-pay | Admitting: Family Medicine

## 2020-01-23 ENCOUNTER — Other Ambulatory Visit: Payer: Self-pay | Admitting: Family Medicine

## 2020-01-23 DIAGNOSIS — N183 Chronic kidney disease, stage 3 unspecified: Secondary | ICD-10-CM

## 2020-01-23 DIAGNOSIS — E1122 Type 2 diabetes mellitus with diabetic chronic kidney disease: Secondary | ICD-10-CM

## 2020-01-24 ENCOUNTER — Other Ambulatory Visit: Payer: Self-pay | Admitting: Family Medicine

## 2020-01-27 ENCOUNTER — Encounter: Payer: Self-pay | Admitting: Family Medicine

## 2020-01-27 ENCOUNTER — Ambulatory Visit: Payer: Medicare PPO | Admitting: Family Medicine

## 2020-01-27 ENCOUNTER — Other Ambulatory Visit: Payer: Self-pay

## 2020-01-27 VITALS — BP 134/81 | HR 68 | Temp 97.9°F | Ht 64.0 in | Wt 182.8 lb

## 2020-01-27 DIAGNOSIS — I10 Essential (primary) hypertension: Secondary | ICD-10-CM | POA: Diagnosis not present

## 2020-01-27 DIAGNOSIS — E538 Deficiency of other specified B group vitamins: Secondary | ICD-10-CM | POA: Diagnosis not present

## 2020-01-27 DIAGNOSIS — E039 Hypothyroidism, unspecified: Secondary | ICD-10-CM

## 2020-01-27 DIAGNOSIS — E559 Vitamin D deficiency, unspecified: Secondary | ICD-10-CM

## 2020-01-27 DIAGNOSIS — E782 Mixed hyperlipidemia: Secondary | ICD-10-CM | POA: Diagnosis not present

## 2020-01-27 DIAGNOSIS — M353 Polymyalgia rheumatica: Secondary | ICD-10-CM | POA: Diagnosis not present

## 2020-01-27 DIAGNOSIS — E1122 Type 2 diabetes mellitus with diabetic chronic kidney disease: Secondary | ICD-10-CM | POA: Diagnosis not present

## 2020-01-27 DIAGNOSIS — N183 Chronic kidney disease, stage 3 unspecified: Secondary | ICD-10-CM

## 2020-01-27 DIAGNOSIS — Z23 Encounter for immunization: Secondary | ICD-10-CM

## 2020-01-27 LAB — BAYER DCA HB A1C WAIVED: HB A1C (BAYER DCA - WAIVED): 5.9 % (ref <7.0)

## 2020-01-27 MED ORDER — ONETOUCH ULTRA VI STRP: ORAL_STRIP | 3 refills | Status: DC

## 2020-01-27 MED ORDER — METFORMIN HCL ER 500 MG PO TB24: 500.0000 mg | ORAL_TABLET | Freq: Two times a day (BID) | ORAL | 0 refills | Status: DC

## 2020-01-27 NOTE — Addendum Note (Signed)
Addended by: Julious Payer D on: 01/27/2020 09:41 AM   Modules accepted: Orders

## 2020-01-27 NOTE — Telephone Encounter (Signed)
E-prescribe down. resent 

## 2020-01-27 NOTE — Progress Notes (Signed)
Subjective:  [name], female    DOB: February 28, 1946  Age: 74 y.o. MRN: [id number]  CC: Follow-up (3 month)   HPI Erin Ortiz presents forFollow-up of diabetes. Patient checks blood sugar at home.   Patient denies symptoms such as polyuria, polydipsia, excessive hunger, nausea No significant hypoglycemic spells noted. Medications reviewed. Pt reports taking them regularly without complication/adverse reaction being reported today.   follow-up on  thyroid. The patient has a history of hypothyroidism for many years. It has been stable recently. Pt. denies any change in  voice, loss of hair, heat or cold intolerance. Energy level has been adequate to good. Patient denies constipation and diarrhea. No myxedema. Medication is as noted below. Verified that pt is taking it daily on an empty stomach. Well tolerated.  presents for  follow-up of hypertension. Patient has no history of headache chest pain or shortness of breath or recent cough. Patient also denies symptoms of TIA such as focal numbness or weakness. Patient denies side effects from medication. States taking it regularly.  History Erin Ortiz has a past medical history of Anemia, Chronic kidney disease, and Diabetes mellitus without complication ([place]).   She has a past surgical history that includes jejunostomy (05/17/2008); Gastric bypass (11/2001); and Vaginal hysterectomy (12/1986).   Her family history includes Cancer in her father and mother.She reports that she has never smoked. She has never used smokeless tobacco. She reports that she does not drink alcohol and does not use drugs.  Current Outpatient Medications on File Prior to Visit  Medication Sig Dispense Refill  . atorvastatin (LIPITOR) 40 MG tablet Take 1 tablet by mouth once daily 30 tablet 5  . Blood Glucose Monitoring Suppl (ONE TOUCH ULTRA SYSTEM KIT) W/DEVICE KIT 1 kit by Does not apply route once. 1 each 0  . cyanocobalamin (,VITAMIN B-12,) 1000 MCG/ML  injection Inject 1 mL (1,000 mcg total) into the skin every 30 (thirty) days. 10 mL prn  . Cyanocobalamin 1000 MCG/ML KIT Give 1 ml from one vial and 0.2 ml from second vial monthly 4 kit 3  . EUTHYROX 125 MCG tablet TAKE 1 TABLET BY MOUTH IN THE MORNING 30  MINUTES  BEFORE  MEAL 90 tablet 1  . glucose blood (ONETOUCH ULTRA) test strip Test BS TID Dx E11.21 300 each 3  . lisinopril (ZESTRIL) 20 MG tablet Take 1 tablet by mouth twice daily 180 tablet 1  . metFORMIN (GLUCOPHAGE-XR) 500 MG 24 hr tablet Take 1 tablet (500 mg total) by mouth 2 (two) times daily. 180 tablet 0  . Multiple Minerals-Vitamins (CALCIUM-MAGNESIUM-ZINC-D3 PO) Take by mouth 3 (three) times daily.    Marland Kitchen omeprazole (PRILOSEC) 40 MG capsule Take 1 capsule by mouth once daily 90 capsule 0  . Pediatric Multiple Vit-C-FA (FLINSTONES GUMMIES OMEGA-3 DHA) CHEW Chew by mouth.    . predniSONE (DELTASONE) 1 MG tablet Take 4 tablets (4 mg total) by mouth daily. 120 tablet 5  . raloxifene (EVISTA) 60 MG tablet Take 1 tablet by mouth once daily 90 tablet 0  . vitamin C (ASCORBIC ACID) 500 MG tablet Take 500 mg by mouth daily.     Current Facility-Administered Medications on File Prior to Visit  Medication Dose Route Frequency Provider Last Rate Last Admin  . betamethasone acetate-betamethasone sodium phosphate (CELESTONE) injection 6 mg  6 mg Intramuscular Once Claretta Fraise, MD      . cyanocobalamin ((VITAMIN B-12)) injection 1,000 mcg  1,000 mcg Intramuscular Q30 days Claretta Fraise, MD   1,000  mcg at 01/27/20 1027    ROS Review of Systems  Constitutional: Negative.   HENT: Negative for congestion.   Eyes: Negative for visual disturbance.  Respiratory: Negative for shortness of breath.   Cardiovascular: Negative for chest pain.  Gastrointestinal: Negative for abdominal pain, constipation, diarrhea, nausea and vomiting.  Genitourinary: Negative for difficulty urinating.  Musculoskeletal: Positive for arthralgias (secondary to RA.  Now traking prednisone 4 mg daily) and myalgias.  Neurological: Negative for headaches.  Psychiatric/Behavioral: Negative for sleep disturbance.    Objective:  BP 134/81   Pulse 68   Temp 97.9 F (36.6 C) (Temporal)   Ht 5' 4"  (1.626 m)   Wt 182 lb 12.8 oz (82.9 kg)   LMP 04/11/1986   BMI 31.38 kg/m   BP Readings from Last 3 Encounters:  01/27/20 134/81  11/07/19 (!) 133/86  10/23/19 133/77    Wt Readings from Last 3 Encounters:  01/27/20 182 lb 12.8 oz (82.9 kg)  11/07/19 179 lb 12.8 oz (81.6 kg)  10/23/19 181 lb (82.1 kg)     Physical Exam Constitutional:      General: She is not in acute distress.    Appearance: She is well-developed.  Cardiovascular:     Rate and Rhythm: Normal rate and regular rhythm.  Pulmonary:     Breath sounds: Normal breath sounds.  Skin:    General: Skin is warm and dry.  Neurological:     Mental Status: She is alert and oriented to person, place, and time.       Assessment & Plan:   Erin Ortiz was seen today for follow-up.  Diagnoses and all orders for this visit:  Type 2 diabetes mellitus with stage 3 chronic kidney disease, without long-term current use of insulin, unspecified whether stage 3a or 3b CKD ([place]) -     CBC with Differential/Platelet -     CMP14+EGFR -     Bayer DCA Hb A1c Waived  B12 deficiency -     CBC with Differential/Platelet -     CMP14+EGFR -     Vitamin B12  Acquired hypothyroidism -     CBC with Differential/Platelet -     CMP14+EGFR -     Thyroid Panel With TSH  Mixed hyperlipidemia -     CBC with Differential/Platelet -     CMP14+EGFR -     Lipid panel  Essential (primary) hypertension -     CBC with Differential/Platelet -     CMP14+EGFR  Need for immunization against influenza -     Flu Vaccine QUAD High Dose(Fluad)  Anarthritic rheumatoid disease ([place])  Avitaminosis D      I am having Erin Ortiz maintain her ONE TOUCH ULTRA SYSTEM KIT, Multiple Minerals-Vitamins  (CALCIUM-MAGNESIUM-ZINC-D3 PO), Flinstones Gummies Omega-3 DHA, vitamin C, Cyanocobalamin, cyanocobalamin, atorvastatin, predniSONE, Euthyrox, raloxifene, lisinopril, omeprazole, metFORMIN, and OneTouch Ultra. We administered cyanocobalamin. We will continue to administer cyanocobalamin and betamethasone acetate-betamethasone sodium phosphate.  No orders of the defined types were placed in this encounter.    Follow-up: Return in about 3 months (around 04/28/2020).  Claretta Fraise, M.D.

## 2020-01-27 NOTE — Addendum Note (Signed)
Addended by: Julious Payer D on: 01/27/2020 08:58 AM   Modules accepted: Orders

## 2020-01-28 ENCOUNTER — Telehealth: Payer: Self-pay | Admitting: *Deleted

## 2020-01-28 LAB — CMP14+EGFR
ALT: 18 IU/L (ref 0–32)
AST: 16 IU/L (ref 0–40)
Albumin/Globulin Ratio: 1.5 (ref 1.2–2.2)
Albumin: 3.7 g/dL (ref 3.7–4.7)
Alkaline Phosphatase: 80 IU/L (ref 44–121)
BUN/Creatinine Ratio: 15 (ref 12–28)
BUN: 22 mg/dL (ref 8–27)
Bilirubin Total: 0.3 mg/dL (ref 0.0–1.2)
CO2: 22 mmol/L (ref 20–29)
Calcium: 8.6 mg/dL — ABNORMAL LOW (ref 8.7–10.3)
Chloride: 109 mmol/L — ABNORMAL HIGH (ref 96–106)
Creatinine, Ser: 1.46 mg/dL — ABNORMAL HIGH (ref 0.57–1.00)
GFR calc Af Amer: 40 mL/min/{1.73_m2} — ABNORMAL LOW (ref >59)
GFR calc non Af Amer: 35 mL/min/{1.73_m2} — ABNORMAL LOW (ref >59)
Globulin, Total: 2.4 g/dL (ref 1.5–4.5)
Glucose: 110 mg/dL — ABNORMAL HIGH (ref 65–99)
Potassium: 4.6 mmol/L (ref 3.5–5.2)
Sodium: 141 mmol/L (ref 134–144)
Total Protein: 6.1 g/dL (ref 6.0–8.5)

## 2020-01-28 LAB — CBC WITH DIFFERENTIAL/PLATELET
Basophils Absolute: 0.1 10*3/uL (ref 0.0–0.2)
Basos: 1 %
EOS (ABSOLUTE): 0.2 10*3/uL (ref 0.0–0.4)
Eos: 3 %
Hematocrit: 32.2 % — ABNORMAL LOW (ref 34.0–46.6)
Hemoglobin: 10.5 g/dL — ABNORMAL LOW (ref 11.1–15.9)
Immature Grans (Abs): 0 10*3/uL (ref 0.0–0.1)
Immature Granulocytes: 0 %
Lymphocytes Absolute: 1.5 10*3/uL (ref 0.7–3.1)
Lymphs: 21 %
MCH: 29.3 pg (ref 26.6–33.0)
MCHC: 32.6 g/dL (ref 31.5–35.7)
MCV: 90 fL (ref 79–97)
Monocytes Absolute: 0.5 10*3/uL (ref 0.1–0.9)
Monocytes: 7 %
Neutrophils Absolute: 5 10*3/uL (ref 1.4–7.0)
Neutrophils: 68 %
Platelets: 188 10*3/uL (ref 150–450)
RBC: 3.58 x10E6/uL — ABNORMAL LOW (ref 3.77–5.28)
RDW: 13.4 % (ref 11.7–15.4)
WBC: 7.3 10*3/uL (ref 3.4–10.8)

## 2020-01-28 LAB — LIPID PANEL
Chol/HDL Ratio: 2.6 ratio (ref 0.0–4.4)
Cholesterol, Total: 152 mg/dL (ref 100–199)
HDL: 59 mg/dL (ref >39)
LDL Chol Calc (NIH): 77 mg/dL (ref 0–99)
Triglycerides: 84 mg/dL (ref 0–149)
VLDL Cholesterol Cal: 16 mg/dL (ref 5–40)

## 2020-01-28 LAB — THYROID PANEL WITH TSH
Free Thyroxine Index: 2.4 (ref 1.2–4.9)
T3 Uptake Ratio: 31 % (ref 24–39)
T4, Total: 7.6 ug/dL (ref 4.5–12.0)
TSH: 0.102 u[IU]/mL — ABNORMAL LOW (ref 0.450–4.500)

## 2020-01-28 LAB — VITAMIN B12: Vitamin B-12: 643 pg/mL (ref 232–1245)

## 2020-01-28 NOTE — Telephone Encounter (Signed)
Key: [id number] - PA Case ID: [id number]  Drug OneTouch Ultra strips  Form [employer] Electronic PA Form  Sent to Plan today

## 2020-01-28 NOTE — Progress Notes (Signed)
Hello Annabelle,  Your lab result is normal and/or stable.Some minor variations that are not significant are commonly marked abnormal, but do not represent any medical problem for you.  Best regards, Prince Couey, M.D.

## 2020-01-30 MED ORDER — ONETOUCH ULTRA VI STRP
ORAL_STRIP | 3 refills | Status: DC
Start: 2020-01-30 — End: 2021-06-09

## 2020-01-30 NOTE — Telephone Encounter (Signed)
Denied on October 20 Coverage for the requested medical supply has been APPROVED UNDER MEDICARE PART B. [employer] follows Medicare rules. The Medicare Local Coverage Determination for your state, [phone] Glucose Monitors, says blood sugar testing supplies should not exceed a 90-day supply. [employer] has denied your request for 150 day supply, but APPROVED COVERAGE FOR up to 450 test strips per 90 days (enough to test up to 5 times per day) under your Part B benefit for/through 04/10/2021. Please discuss this with your provider or pharmacy   QTY changed and re-sent to pharm [facility] D[facility]

## 2020-02-19 ENCOUNTER — Other Ambulatory Visit: Payer: Self-pay | Admitting: Family Medicine

## 2020-02-26 ENCOUNTER — Other Ambulatory Visit: Payer: Self-pay

## 2020-02-26 ENCOUNTER — Ambulatory Visit (INDEPENDENT_AMBULATORY_CARE_PROVIDER_SITE_OTHER): Payer: Medicare PPO

## 2020-02-26 DIAGNOSIS — E538 Deficiency of other specified B group vitamins: Secondary | ICD-10-CM

## 2020-02-26 DIAGNOSIS — Z23 Encounter for immunization: Secondary | ICD-10-CM

## 2020-02-26 NOTE — Progress Notes (Signed)
   Covid-19 Vaccination Clinic  Name:  Erin Ortiz    MRN: [id number] DOB: 1945-05-09  02/26/2020  [name] was observed post Covid-19 immunization for 15 minutes without incident. She was provided with Vaccine Information Sheet and instruction to access the V-Safe system.   [name] was instructed to call 911 with any severe reactions post vaccine: Marland Kitchen Difficulty breathing  . Swelling of face and throat  . A fast heartbeat  . A bad rash all over body  . Dizziness and weakness   Immunizations Administered    Name Date Dose VIS Date Route   Pfizer COVID-19 Vaccine 02/26/2020 11:37 AM 0.3 mL 01/29/2020 Intramuscular   Manufacturer: [employer], [employer]   Lot: [id number]   NDC: [address]-3

## 2020-02-28 DIAGNOSIS — Z85828 Personal history of other malignant neoplasm of skin: Secondary | ICD-10-CM | POA: Diagnosis not present

## 2020-02-28 DIAGNOSIS — L814 Other melanin hyperpigmentation: Secondary | ICD-10-CM | POA: Diagnosis not present

## 2020-02-28 DIAGNOSIS — L579 Skin changes due to chronic exposure to nonionizing radiation, unspecified: Secondary | ICD-10-CM | POA: Diagnosis not present

## 2020-02-28 DIAGNOSIS — L570 Actinic keratosis: Secondary | ICD-10-CM | POA: Diagnosis not present

## 2020-03-12 ENCOUNTER — Other Ambulatory Visit: Payer: Self-pay | Admitting: Family Medicine

## 2020-03-16 ENCOUNTER — Other Ambulatory Visit: Payer: Self-pay | Admitting: Family Medicine

## 2020-03-17 ENCOUNTER — Telehealth: Payer: Self-pay

## 2020-03-17 MED ORDER — PREDNISONE 1 MG PO TABS
4.0000 mg | ORAL_TABLET | Freq: Every day | ORAL | 5 refills | Status: DC
Start: 2020-03-17 — End: 2020-11-04

## 2020-03-17 NOTE — Telephone Encounter (Signed)
Please refill for 6 mos. She is on long term low dose due to RA

## 2020-03-17 NOTE — Telephone Encounter (Signed)
Patient aware rx sent  

## 2020-03-17 NOTE — Telephone Encounter (Signed)
Pt doesn't need prior Auth but rx refill was denied when it came through due to our office refill policy. Can we refill prednisone or does pt ntbs?

## 2020-03-24 DIAGNOSIS — H43813 Vitreous degeneration, bilateral: Secondary | ICD-10-CM | POA: Diagnosis not present

## 2020-03-24 DIAGNOSIS — H25813 Combined forms of age-related cataract, bilateral: Secondary | ICD-10-CM | POA: Diagnosis not present

## 2020-03-24 DIAGNOSIS — H527 Unspecified disorder of refraction: Secondary | ICD-10-CM | POA: Diagnosis not present

## 2020-03-24 DIAGNOSIS — E119 Type 2 diabetes mellitus without complications: Secondary | ICD-10-CM | POA: Diagnosis not present

## 2020-03-24 DIAGNOSIS — H33313 Horseshoe tear of retina without detachment, bilateral: Secondary | ICD-10-CM | POA: Diagnosis not present

## 2020-03-24 LAB — HM DIABETES EYE EXAM

## 2020-04-07 ENCOUNTER — Ambulatory Visit (INDEPENDENT_AMBULATORY_CARE_PROVIDER_SITE_OTHER): Payer: Medicare PPO

## 2020-04-07 ENCOUNTER — Other Ambulatory Visit: Payer: Self-pay

## 2020-04-07 DIAGNOSIS — E538 Deficiency of other specified B group vitamins: Secondary | ICD-10-CM | POA: Diagnosis not present

## 2020-04-07 NOTE — Progress Notes (Signed)
B12 injection given today. Pt tolerated well.

## 2020-04-27 ENCOUNTER — Other Ambulatory Visit: Payer: Self-pay | Admitting: Family Medicine

## 2020-04-28 ENCOUNTER — Ambulatory Visit: Payer: Medicare PPO | Admitting: Family Medicine

## 2020-05-07 ENCOUNTER — Other Ambulatory Visit: Payer: Self-pay

## 2020-05-07 ENCOUNTER — Encounter: Payer: Self-pay | Admitting: Family Medicine

## 2020-05-07 ENCOUNTER — Ambulatory Visit: Payer: Medicare PPO | Admitting: Family Medicine

## 2020-05-07 VITALS — BP 132/82 | HR 76 | Temp 97.1°F | Ht 64.0 in | Wt 183.0 lb

## 2020-05-07 DIAGNOSIS — E538 Deficiency of other specified B group vitamins: Secondary | ICD-10-CM | POA: Diagnosis not present

## 2020-05-07 DIAGNOSIS — N183 Chronic kidney disease, stage 3 unspecified: Secondary | ICD-10-CM | POA: Diagnosis not present

## 2020-05-07 DIAGNOSIS — M353 Polymyalgia rheumatica: Secondary | ICD-10-CM | POA: Diagnosis not present

## 2020-05-07 DIAGNOSIS — E782 Mixed hyperlipidemia: Secondary | ICD-10-CM | POA: Diagnosis not present

## 2020-05-07 DIAGNOSIS — E1122 Type 2 diabetes mellitus with diabetic chronic kidney disease: Secondary | ICD-10-CM | POA: Diagnosis not present

## 2020-05-07 DIAGNOSIS — E039 Hypothyroidism, unspecified: Secondary | ICD-10-CM

## 2020-05-07 DIAGNOSIS — I10 Essential (primary) hypertension: Secondary | ICD-10-CM

## 2020-05-07 DIAGNOSIS — E1121 Type 2 diabetes mellitus with diabetic nephropathy: Secondary | ICD-10-CM

## 2020-05-07 LAB — BAYER DCA HB A1C WAIVED: HB A1C (BAYER DCA - WAIVED): 6.6 % (ref <7.0)

## 2020-05-08 LAB — CBC WITH DIFFERENTIAL/PLATELET
Basophils Absolute: 0.1 10*3/uL (ref 0.0–0.2)
Basos: 1 %
EOS (ABSOLUTE): 0.2 10*3/uL (ref 0.0–0.4)
Eos: 3 %
Hematocrit: 33.8 % — ABNORMAL LOW (ref 34.0–46.6)
Hemoglobin: 11.2 g/dL (ref 11.1–15.9)
Immature Grans (Abs): 0 10*3/uL (ref 0.0–0.1)
Immature Granulocytes: 0 %
Lymphocytes Absolute: 1.4 10*3/uL (ref 0.7–3.1)
Lymphs: 23 %
MCH: 28.6 pg (ref 26.6–33.0)
MCHC: 33.1 g/dL (ref 31.5–35.7)
MCV: 86 fL (ref 79–97)
Monocytes Absolute: 0.5 10*3/uL (ref 0.1–0.9)
Monocytes: 8 %
Neutrophils Absolute: 4 10*3/uL (ref 1.4–7.0)
Neutrophils: 65 %
Platelets: 206 10*3/uL (ref 150–450)
RBC: 3.92 x10E6/uL (ref 3.77–5.28)
RDW: 13.3 % (ref 11.7–15.4)
WBC: 6.1 10*3/uL (ref 3.4–10.8)

## 2020-05-08 LAB — CMP14+EGFR
ALT: 13 IU/L (ref 0–32)
AST: 15 IU/L (ref 0–40)
Albumin/Globulin Ratio: 1.9 (ref 1.2–2.2)
Albumin: 4.1 g/dL (ref 3.7–4.7)
Alkaline Phosphatase: 106 IU/L (ref 44–121)
BUN/Creatinine Ratio: 17 (ref 12–28)
BUN: 25 mg/dL (ref 8–27)
Bilirubin Total: 0.3 mg/dL (ref 0.0–1.2)
CO2: 23 mmol/L (ref 20–29)
Calcium: 8.8 mg/dL (ref 8.7–10.3)
Chloride: 106 mmol/L (ref 96–106)
Creatinine, Ser: 1.51 mg/dL — ABNORMAL HIGH (ref 0.57–1.00)
GFR calc Af Amer: 39 mL/min/{1.73_m2} — ABNORMAL LOW (ref >59)
GFR calc non Af Amer: 34 mL/min/{1.73_m2} — ABNORMAL LOW (ref >59)
Globulin, Total: 2.2 g/dL (ref 1.5–4.5)
Glucose: 106 mg/dL — ABNORMAL HIGH (ref 65–99)
Potassium: 4.2 mmol/L (ref 3.5–5.2)
Sodium: 141 mmol/L (ref 134–144)
Total Protein: 6.3 g/dL (ref 6.0–8.5)

## 2020-05-08 LAB — LIPID PANEL
Chol/HDL Ratio: 2.6 ratio (ref 0.0–4.4)
Cholesterol, Total: 152 mg/dL (ref 100–199)
HDL: 59 mg/dL (ref >39)
LDL Chol Calc (NIH): 77 mg/dL (ref 0–99)
Triglycerides: 83 mg/dL (ref 0–149)
VLDL Cholesterol Cal: 16 mg/dL (ref 5–40)

## 2020-05-08 LAB — VITAMIN B12: Vitamin B-12: 638 pg/mL (ref 232–1245)

## 2020-05-08 LAB — THYROID PANEL WITH TSH
Free Thyroxine Index: 2.9 (ref 1.2–4.9)
T3 Uptake Ratio: 33 % (ref 24–39)
T4, Total: 8.8 ug/dL (ref 4.5–12.0)
TSH: 0.176 u[IU]/mL — ABNORMAL LOW (ref 0.450–4.500)

## 2020-05-10 ENCOUNTER — Encounter: Payer: Self-pay | Admitting: Family Medicine

## 2020-05-10 MED ORDER — METFORMIN HCL ER 500 MG PO TB24: 500.0000 mg | ORAL_TABLET | Freq: Two times a day (BID) | ORAL | 0 refills | Status: DC

## 2020-05-10 MED ORDER — LISINOPRIL 20 MG PO TABS
20.0000 mg | ORAL_TABLET | Freq: Two times a day (BID) | ORAL | 1 refills | Status: DC
Start: 2020-05-10 — End: 2020-11-04

## 2020-05-10 NOTE — Progress Notes (Signed)
Subjective:  [name],  female    DOB: 29-Sep-1945  Age: 75 y.o.    CC: Medical Management of Chronic Issues (3 month)   HPI Erin Ortiz presents for  follow-up of hypertension. Patient has no history of headache chest pain or shortness of breath or recent cough. Patient also denies symptoms of TIA such as numbness weakness lateralizing. Patient denies side effects from medication. States taking it regularly.  Patient also  in for follow-up of elevated cholesterol. Doing well without complaints on current medication. Denies side effects  including myalgia and arthralgia and nausea. Also in today for liver function testing. Currently no chest pain, shortness of breath or other cardiovascular related symptoms noted.  Follow-up of diabetes. Patient does check blood sugar at home. Readings run between 100 and 150 Patient denies symptoms such as excessive hunger or urinary frequency, excessive hunger, nausea No significant hypoglycemic spells noted. Medications reviewed. Pt reports taking them regularly. Pt. denies complication/adverse reaction today.  Patient does take low-dose of prednisone every day due to rheumatoid arthritis.  Every time she tries to taper below that dose she has a flare.  She has been quite stable on that dose for many years.  It was recently set up by rheumatology for her.  Patient presents for follow-up on  thyroid. The patient has a history of hypothyroidism for many years. It has been stable recently. Pt. denies any change in  voice, loss of hair, heat or cold intolerance. Energy level has been adequate to good. Patient denies constipation and diarrhea. No myxedema. Medication is as noted below. Verified that pt is taking it daily on an empty stomach. Well tolerated.   History Erin Ortiz has a past medical history of Anemia, Chronic kidney disease, and Diabetes mellitus without complication ([place]).   She has a past surgical history that includes  jejunostomy (05/17/2008); Gastric bypass (11/2001); and Vaginal hysterectomy (12/1986).   Her family history includes Cancer in her father and mother.She reports that she has never smoked. She has never used smokeless tobacco. She reports that she does not drink alcohol and does not use drugs.  Current Outpatient Medications on File Prior to Visit  Medication Sig Dispense Refill  . atorvastatin (LIPITOR) 40 MG tablet Take 1 tablet by mouth once daily 90 tablet 1  . Blood Glucose Monitoring Suppl (ONE TOUCH ULTRA SYSTEM KIT) W/DEVICE KIT 1 kit by Does not apply route once. 1 each 0  . cyanocobalamin (,VITAMIN B-12,) 1000 MCG/ML injection Inject 1 mL (1,000 mcg total) into the skin every 30 (thirty) days. 10 mL prn  . Cyanocobalamin 1000 MCG/ML KIT Give 1 ml from one vial and 0.2 ml from second vial monthly 4 kit 3  . EUTHYROX 125 MCG tablet TAKE 1 TABLET BY MOUTH IN THE MORNING 30  MINUTES  BEFORE  MEAL 90 tablet 0  . glucose blood (ONETOUCH ULTRA) test strip Test BS TID Dx E11.21 100 each 3  . Multiple Minerals-Vitamins (CALCIUM-MAGNESIUM-ZINC-D3 PO) Take by mouth 3 (three) times daily.    Marland Kitchen omeprazole (PRILOSEC) 40 MG capsule Take 1 capsule by mouth once daily 90 capsule 0  . Pediatric Multiple Vit-C-FA (FLINSTONES GUMMIES OMEGA-3 DHA) CHEW Chew by mouth.    . predniSONE (DELTASONE) 1 MG tablet Take 4 tablets (4 mg total) by mouth daily. 120 tablet 5  . raloxifene (EVISTA) 60 MG tablet Take 1 tablet by mouth once daily 90 tablet 1  . vitamin C (ASCORBIC ACID) 500 MG tablet Take 500  mg by mouth daily.     Current Facility-Administered Medications on File Prior to Visit  Medication Dose Route Frequency Provider Last Rate Last Admin  . betamethasone acetate-betamethasone sodium phosphate (CELESTONE) injection 6 mg  6 mg Intramuscular Once Erin Fraise, MD      . cyanocobalamin ((VITAMIN B-12)) injection 1,000 mcg  1,000 mcg Intramuscular Q30 days Erin Fraise, MD   1,000 mcg at 05/07/20 1606     ROS Review of Systems  Constitutional: Negative.   HENT: Negative.   Eyes: Negative for visual disturbance.  Respiratory: Negative for shortness of breath.   Cardiovascular: Negative for chest pain.  Gastrointestinal: Negative for abdominal pain.  Musculoskeletal: Negative for arthralgias.    Objective:  BP 132/82   Pulse 76   Temp (!) 97.1 F (36.2 C) (Temporal)   Ht 5' 4"  (1.626 m)   Wt 183 lb (83 kg)   LMP 04/11/1986   BMI 31.41 kg/m   BP Readings from Last 3 Encounters:  05/07/20 132/82  01/27/20 134/81  11/07/19 (!) 133/86    Wt Readings from Last 3 Encounters:  05/07/20 183 lb (83 kg)  01/27/20 182 lb 12.8 oz (82.9 kg)  11/07/19 179 lb 12.8 oz (81.6 kg)     Physical Exam Constitutional:      General: She is not in acute distress.    Appearance: She is well-developed.  HENT:     Head: Normocephalic and atraumatic.  Eyes:     Conjunctiva/sclera: Conjunctivae normal.     Pupils: Pupils are equal, round, and reactive to light.  Neck:     Thyroid: No thyromegaly.  Cardiovascular:     Rate and Rhythm: Normal rate and regular rhythm.     Heart sounds: Normal heart sounds. No murmur heard.   Pulmonary:     Effort: Pulmonary effort is normal. No respiratory distress.     Breath sounds: Normal breath sounds. No wheezing or rales.  Abdominal:     General: Bowel sounds are normal. There is no distension.     Palpations: Abdomen is soft.     Tenderness: There is no abdominal tenderness.  Musculoskeletal:        General: Normal range of motion.     Cervical back: Normal range of motion and neck supple.  Lymphadenopathy:     Cervical: No cervical adenopathy.  Skin:    General: Skin is warm and dry.  Neurological:     Mental Status: She is alert and oriented to person, place, and time.  Psychiatric:        Behavior: Behavior normal.        Thought Content: Thought content normal.        Judgment: Judgment normal.     Diabetic Foot Exam - Simple    No data filed       Assessment & Plan:   Chari was seen today for medical management of chronic issues.  Diagnoses and all orders for this visit:  Acquired hypothyroidism -     CBC with Differential/Platelet -     CMP14+EGFR -     Lipid panel -     Thyroid Panel With TSH  Type 2 diabetes mellitus with stage 3 chronic kidney disease, without long-term current use of insulin, unspecified whether stage 3a or 3b CKD ([facility]) -     metFORMIN (GLUCOPHAGE-XR) 500 MG 24 hr tablet; Take 1 tablet (500 mg total) by mouth 2 (two) times daily. -     CBC with Differential/Platelet -  CMP14+EGFR -     Lipid panel -     Vitamin B12 -     Thyroid Panel With TSH -     Bayer DCA Hb A1c Waived  B12 deficiency -     CBC with Differential/Platelet -     CMP14+EGFR -     Lipid panel -     Vitamin B12  Anarthritic rheumatoid disease ([place])  Diabetic nephropathy associated with type 2 diabetes mellitus ([facility])  Essential (primary) hypertension  Mixed hyperlipidemia  Other orders -     lisinopril (ZESTRIL) 20 MG tablet; Take 1 tablet (20 mg total) by mouth 2 (two) times daily.   I have changed Marcella Montroy's lisinopril. I am also having her maintain her [place]flow KIT, Multiple Minerals-Vitamins (CALCIUM-MAGNESIUM-ZINC-D3 PO), Flinstones Gummies Omega-3 DHA, vitamin C, Cyanocobalamin, cyanocobalamin, OneTouch Ultra, atorvastatin, raloxifene, Euthyrox, predniSONE, omeprazole, and metFORMIN. We administered cyanocobalamin. We will continue to administer cyanocobalamin and betamethasone acetate-betamethasone sodium phosphate.  Meds ordered this encounter  Medications  . metFORMIN (GLUCOPHAGE-XR) 500 MG 24 hr tablet    Sig: Take 1 tablet (500 mg total) by mouth 2 (two) times daily.    Dispense:  180 tablet    Refill:  0  . lisinopril (ZESTRIL) 20 MG tablet    Sig: Take 1 tablet (20 mg total) by mouth 2 (two) times daily.    Dispense:  180 tablet    Refill:  1     Follow-up:  Return in about 3 months (around 08/05/2020).  Erin Ortiz, M.D.

## 2020-05-11 ENCOUNTER — Other Ambulatory Visit: Payer: Self-pay | Admitting: *Deleted

## 2020-05-11 LAB — SPECIMEN STATUS REPORT

## 2020-05-12 LAB — T4, FREE: Free T4: 1.69 ng/dL (ref 0.82–1.77)

## 2020-05-12 LAB — SPECIMEN STATUS REPORT

## 2020-05-12 LAB — T3, FREE: T3, Free: 3 pg/mL (ref 2.0–4.4)

## 2020-05-12 NOTE — Progress Notes (Signed)
Hello Alletta,  Your lab result is normal and/or stable.Some minor variations that are not significant are commonly marked abnormal, but do not represent any medical problem for you.  Best regards, Holley Kocurek, M.D.

## 2020-06-08 ENCOUNTER — Other Ambulatory Visit: Payer: Self-pay

## 2020-06-08 ENCOUNTER — Ambulatory Visit (INDEPENDENT_AMBULATORY_CARE_PROVIDER_SITE_OTHER): Payer: Medicare PPO | Admitting: *Deleted

## 2020-06-08 ENCOUNTER — Other Ambulatory Visit: Payer: Self-pay | Admitting: Family Medicine

## 2020-06-08 DIAGNOSIS — E538 Deficiency of other specified B group vitamins: Secondary | ICD-10-CM | POA: Diagnosis not present

## 2020-06-08 NOTE — Progress Notes (Signed)
B12 injection, pt tolerated well.

## 2020-07-07 ENCOUNTER — Ambulatory Visit (INDEPENDENT_AMBULATORY_CARE_PROVIDER_SITE_OTHER): Payer: Medicare PPO | Admitting: *Deleted

## 2020-07-07 ENCOUNTER — Other Ambulatory Visit: Payer: Self-pay

## 2020-07-07 DIAGNOSIS — E538 Deficiency of other specified B group vitamins: Secondary | ICD-10-CM

## 2020-07-07 NOTE — Progress Notes (Signed)
Pt given B12 injection IM right deltoid and tolerated well. 

## 2020-08-02 ENCOUNTER — Other Ambulatory Visit: Payer: Self-pay | Admitting: Family Medicine

## 2020-08-05 ENCOUNTER — Encounter: Payer: Self-pay | Admitting: Family Medicine

## 2020-08-05 ENCOUNTER — Other Ambulatory Visit: Payer: Self-pay

## 2020-08-05 ENCOUNTER — Ambulatory Visit: Payer: Medicare PPO | Admitting: Family Medicine

## 2020-08-05 VITALS — BP 125/80 | HR 76 | Temp 98.7°F | Ht 64.0 in | Wt 190.4 lb

## 2020-08-05 DIAGNOSIS — N183 Chronic kidney disease, stage 3 unspecified: Secondary | ICD-10-CM | POA: Diagnosis not present

## 2020-08-05 DIAGNOSIS — I10 Essential (primary) hypertension: Secondary | ICD-10-CM

## 2020-08-05 DIAGNOSIS — E1122 Type 2 diabetes mellitus with diabetic chronic kidney disease: Secondary | ICD-10-CM

## 2020-08-05 DIAGNOSIS — E039 Hypothyroidism, unspecified: Secondary | ICD-10-CM | POA: Diagnosis not present

## 2020-08-05 DIAGNOSIS — E782 Mixed hyperlipidemia: Secondary | ICD-10-CM | POA: Diagnosis not present

## 2020-08-05 DIAGNOSIS — E538 Deficiency of other specified B group vitamins: Secondary | ICD-10-CM | POA: Diagnosis not present

## 2020-08-05 LAB — BAYER DCA HB A1C WAIVED: HB A1C (BAYER DCA - WAIVED): 6.3 % (ref <7.0)

## 2020-08-05 NOTE — Progress Notes (Signed)
Subjective:  [name], female    DOB: 08-08-1945  Age: 75 y.o. MRN: [id number]  CC: Medical Management of Chronic Issues   HPI Erin Ortiz presents for presents forFollow-up of diabetes. Patient checks blood sugar at home.   100-150 fasting and  postprandial Patient denies symptoms such as polyuria, polydipsia, excessive hunger, nausea No significant hypoglycemic spells noted. Medications reviewed. Pt reports taking them regularly without complication/adverse reaction being reported today.     Depression screen [facility] 2/9 08/05/2020 05/07/2020 01/27/2020  Decreased Interest 0 0 0  Down, Depressed, Hopeless 0 0 0  PHQ - 2 Score 0 0 0    History Erin Ortiz has a past medical history of Anemia, Chronic kidney disease, and Diabetes mellitus without complication ([place]).   She has a past surgical history that includes jejunostomy (05/17/2008); Gastric bypass (11/2001); and Vaginal hysterectomy (12/1986).   Her family history includes Cancer in her father and mother.She reports that she has never smoked. She has never used smokeless tobacco. She reports that she does not drink alcohol and does not use drugs.    ROS Review of Systems  Constitutional: Negative.   HENT: Negative.   Eyes: Negative for visual disturbance.  Respiratory: Negative for shortness of breath.   Cardiovascular: Negative for chest pain.  Gastrointestinal: Negative for abdominal pain.  Musculoskeletal: Negative for arthralgias.    Objective:  BP 125/80   Pulse 76   Temp 98.7 F (37.1 C)   Ht 5' 4" (1.626 m)   Wt 190 lb 6.4 oz (86.4 kg)   LMP 04/11/1986   SpO2 99%   BMI 32.68 kg/m   BP Readings from Last 3 Encounters:  08/05/20 125/80  05/07/20 132/82  01/27/20 134/81    Wt Readings from Last 3 Encounters:  08/05/20 190 lb 6.4 oz (86.4 kg)  05/07/20 183 lb (83 kg)  01/27/20 182 lb 12.8 oz (82.9 kg)     Physical Exam Constitutional:      General: She is not in acute distress.     Appearance: She is well-developed.  HENT:     Head: Normocephalic and atraumatic.  Eyes:     Conjunctiva/sclera: Conjunctivae normal.     Pupils: Pupils are equal, round, and reactive to light.  Neck:     Thyroid: No thyromegaly.  Cardiovascular:     Rate and Rhythm: Normal rate and regular rhythm.     Heart sounds: Normal heart sounds. No murmur heard.   Pulmonary:     Effort: Pulmonary effort is normal. No respiratory distress.     Breath sounds: Normal breath sounds. No wheezing or rales.  Abdominal:     General: Bowel sounds are normal. There is no distension.     Palpations: Abdomen is soft.     Tenderness: There is no abdominal tenderness.  Musculoskeletal:        General: Normal range of motion.     Cervical back: Normal range of motion and neck supple.  Lymphadenopathy:     Cervical: No cervical adenopathy.  Skin:    General: Skin is warm and dry.  Neurological:     Mental Status: She is alert and oriented to person, place, and time.  Psychiatric:        Behavior: Behavior normal.        Thought Content: Thought content normal.        Judgment: Judgment normal.       Assessment & Plan:   Erin Ortiz was seen today for medical  management of chronic issues.  Diagnoses and all orders for this visit:  Type 2 diabetes mellitus with stage 3 chronic kidney disease, without long-term current use of insulin, unspecified whether stage 3a or 3b CKD ([facility]) -     Bayer DCA Hb A1c Waived -     CBC with Differential/Platelet -     CMP14+EGFR  Acquired hypothyroidism -     Thyroid Panel With TSH  Essential (primary) hypertension  Mixed hyperlipidemia -     Lipid panel       I am having Erin Ortiz maintain her ONE TOUCH ULTRA SYSTEM KIT, Multiple Minerals-Vitamins (CALCIUM-MAGNESIUM-ZINC-D3 PO), Flinstones Gummies Omega-3 DHA, vitamin C, Cyanocobalamin, cyanocobalamin, OneTouch Ultra, atorvastatin, raloxifene, predniSONE, metFORMIN, lisinopril, Euthyrox, and  omeprazole. We will continue to administer cyanocobalamin and betamethasone acetate-betamethasone sodium phosphate.  Allergies as of 08/05/2020      Reactions   Januvia [sitagliptin] Nausea Only      Medication List       Accurate as of August 05, 2020  4:36 PM. If you have any questions, ask your nurse or doctor.        atorvastatin 40 MG tablet Commonly known as: LIPITOR Take 1 tablet by mouth once daily   CALCIUM-MAGNESIUM-ZINC-D3 PO Take by mouth 3 (three) times daily.   Cyanocobalamin 1000 MCG/ML Kit Give 1 ml from one vial and 0.2 ml from second vial monthly   cyanocobalamin 1000 MCG/ML injection Commonly known as: (VITAMIN B-12) Inject 1 mL (1,000 mcg total) into the skin every 30 (thirty) days.   Euthyrox 125 MCG tablet Generic drug: levothyroxine TAKE 1 TABLET BY MOUTH IN THE MORNING 30  MINUTES  BEFORE  MEAL   Flinstones Gummies Omega-3 DHA Chew Chew by mouth.   lisinopril 20 MG tablet Commonly known as: ZESTRIL Take 1 tablet (20 mg total) by mouth 2 (two) times daily.   metFORMIN 500 MG 24 hr tablet Commonly known as: GLUCOPHAGE-XR Take 1 tablet (500 mg total) by mouth 2 (two) times daily.   omeprazole 40 MG capsule Commonly known as: PRILOSEC Take 1 capsule by mouth once daily   ONE TOUCH ULTRA SYSTEM KIT w/Device Kit 1 kit by Does not apply route once.   OneTouch Ultra test strip Generic drug: glucose blood Test BS TID Dx E11.21   predniSONE 1 MG tablet Commonly known as: DELTASONE Take 4 tablets (4 mg total) by mouth daily.   raloxifene 60 MG tablet Commonly known as: EVISTA Take 1 tablet by mouth once daily   vitamin C 500 MG tablet Commonly known as: ASCORBIC ACID Take 500 mg by mouth daily.        Follow-up: Return in about 3 months (around 11/04/2020).  Claretta Fraise, M.D.

## 2020-08-06 LAB — LIPID PANEL
Chol/HDL Ratio: 2.7 ratio (ref 0.0–4.4)
Cholesterol, Total: 155 mg/dL (ref 100–199)
HDL: 57 mg/dL (ref >39)
LDL Chol Calc (NIH): 83 mg/dL (ref 0–99)
Triglycerides: 80 mg/dL (ref 0–149)
VLDL Cholesterol Cal: 15 mg/dL (ref 5–40)

## 2020-08-06 LAB — CMP14+EGFR
ALT: 15 IU/L (ref 0–32)
AST: 15 IU/L (ref 0–40)
Albumin/Globulin Ratio: 2.1 (ref 1.2–2.2)
Albumin: 4 g/dL (ref 3.7–4.7)
Alkaline Phosphatase: 122 IU/L — ABNORMAL HIGH (ref 44–121)
BUN/Creatinine Ratio: 15 (ref 12–28)
BUN: 24 mg/dL (ref 8–27)
Bilirubin Total: 0.3 mg/dL (ref 0.0–1.2)
CO2: 20 mmol/L (ref 20–29)
Calcium: 8.3 mg/dL — ABNORMAL LOW (ref 8.7–10.3)
Chloride: 106 mmol/L (ref 96–106)
Creatinine, Ser: 1.6 mg/dL — ABNORMAL HIGH (ref 0.57–1.00)
Globulin, Total: 1.9 g/dL (ref 1.5–4.5)
Glucose: 86 mg/dL (ref 65–99)
Potassium: 4.9 mmol/L (ref 3.5–5.2)
Sodium: 139 mmol/L (ref 134–144)
Total Protein: 5.9 g/dL — ABNORMAL LOW (ref 6.0–8.5)
eGFR: 33 mL/min/{1.73_m2} — ABNORMAL LOW (ref >59)

## 2020-08-06 LAB — CBC WITH DIFFERENTIAL/PLATELET
Basophils Absolute: 0.1 10*3/uL (ref 0.0–0.2)
Basos: 1 %
EOS (ABSOLUTE): 0.1 10*3/uL (ref 0.0–0.4)
Eos: 2 %
Hematocrit: 30.5 % — ABNORMAL LOW (ref 34.0–46.6)
Hemoglobin: 9.8 g/dL — ABNORMAL LOW (ref 11.1–15.9)
Immature Grans (Abs): 0 10*3/uL (ref 0.0–0.1)
Immature Granulocytes: 1 %
Lymphocytes Absolute: 1.4 10*3/uL (ref 0.7–3.1)
Lymphs: 21 %
MCH: 28.7 pg (ref 26.6–33.0)
MCHC: 32.1 g/dL (ref 31.5–35.7)
MCV: 89 fL (ref 79–97)
Monocytes Absolute: 0.6 10*3/uL (ref 0.1–0.9)
Monocytes: 9 %
Neutrophils Absolute: 4.3 10*3/uL (ref 1.4–7.0)
Neutrophils: 66 %
Platelets: 187 10*3/uL (ref 150–450)
RBC: 3.42 x10E6/uL — ABNORMAL LOW (ref 3.77–5.28)
RDW: 14.3 % (ref 11.7–15.4)
WBC: 6.5 10*3/uL (ref 3.4–10.8)

## 2020-08-06 LAB — THYROID PANEL WITH TSH
Free Thyroxine Index: 2 (ref 1.2–4.9)
T3 Uptake Ratio: 29 % (ref 24–39)
T4, Total: 7 ug/dL (ref 4.5–12.0)
TSH: 0.317 u[IU]/mL — ABNORMAL LOW (ref 0.450–4.500)

## 2020-08-19 ENCOUNTER — Other Ambulatory Visit: Payer: Self-pay | Admitting: Family Medicine

## 2020-08-22 ENCOUNTER — Other Ambulatory Visit: Payer: Self-pay | Admitting: Family Medicine

## 2020-08-22 DIAGNOSIS — E1122 Type 2 diabetes mellitus with diabetic chronic kidney disease: Secondary | ICD-10-CM

## 2020-08-22 DIAGNOSIS — N183 Chronic kidney disease, stage 3 unspecified: Secondary | ICD-10-CM

## 2020-08-27 DIAGNOSIS — L814 Other melanin hyperpigmentation: Secondary | ICD-10-CM | POA: Diagnosis not present

## 2020-08-27 DIAGNOSIS — Z85828 Personal history of other malignant neoplasm of skin: Secondary | ICD-10-CM | POA: Diagnosis not present

## 2020-08-27 DIAGNOSIS — L579 Skin changes due to chronic exposure to nonionizing radiation, unspecified: Secondary | ICD-10-CM | POA: Diagnosis not present

## 2020-08-27 DIAGNOSIS — L570 Actinic keratosis: Secondary | ICD-10-CM | POA: Diagnosis not present

## 2020-08-28 ENCOUNTER — Other Ambulatory Visit: Payer: Self-pay | Admitting: Family Medicine

## 2020-09-14 ENCOUNTER — Ambulatory Visit: Payer: Medicare PPO

## 2020-09-18 ENCOUNTER — Other Ambulatory Visit: Payer: Self-pay

## 2020-09-18 ENCOUNTER — Ambulatory Visit (INDEPENDENT_AMBULATORY_CARE_PROVIDER_SITE_OTHER): Payer: Medicare PPO

## 2020-09-18 DIAGNOSIS — E538 Deficiency of other specified B group vitamins: Secondary | ICD-10-CM

## 2020-09-18 NOTE — Progress Notes (Signed)
Cyanocobalamin injection given to left deltoid.  Patient tolerated well. 

## 2020-10-19 ENCOUNTER — Other Ambulatory Visit: Payer: Self-pay

## 2020-10-19 ENCOUNTER — Ambulatory Visit (INDEPENDENT_AMBULATORY_CARE_PROVIDER_SITE_OTHER): Payer: Medicare PPO | Admitting: *Deleted

## 2020-10-19 DIAGNOSIS — E538 Deficiency of other specified B group vitamins: Secondary | ICD-10-CM

## 2020-11-04 ENCOUNTER — Ambulatory Visit: Payer: Medicare PPO | Admitting: Family Medicine

## 2020-11-04 ENCOUNTER — Other Ambulatory Visit: Payer: Self-pay

## 2020-11-04 ENCOUNTER — Encounter: Payer: Self-pay | Admitting: Family Medicine

## 2020-11-04 VITALS — BP 116/71 | HR 82 | Temp 97.9°F | Ht 64.0 in | Wt 193.2 lb

## 2020-11-04 DIAGNOSIS — D509 Iron deficiency anemia, unspecified: Secondary | ICD-10-CM

## 2020-11-04 DIAGNOSIS — I10 Essential (primary) hypertension: Secondary | ICD-10-CM | POA: Diagnosis not present

## 2020-11-04 DIAGNOSIS — E1122 Type 2 diabetes mellitus with diabetic chronic kidney disease: Secondary | ICD-10-CM | POA: Diagnosis not present

## 2020-11-04 DIAGNOSIS — E782 Mixed hyperlipidemia: Secondary | ICD-10-CM | POA: Diagnosis not present

## 2020-11-04 DIAGNOSIS — N183 Chronic kidney disease, stage 3 unspecified: Secondary | ICD-10-CM

## 2020-11-04 DIAGNOSIS — E039 Hypothyroidism, unspecified: Secondary | ICD-10-CM

## 2020-11-04 LAB — BAYER DCA HB A1C WAIVED: HB A1C (BAYER DCA - WAIVED): 6.3 % (ref <7.0)

## 2020-11-04 MED ORDER — RALOXIFENE HCL 60 MG PO TABS: 60.0000 mg | ORAL_TABLET | Freq: Every day | ORAL | 3 refills | Status: DC

## 2020-11-04 MED ORDER — METFORMIN HCL ER 500 MG PO TB24: 500.0000 mg | ORAL_TABLET | Freq: Two times a day (BID) | ORAL | 3 refills | Status: DC

## 2020-11-04 MED ORDER — PREDNISONE 1 MG PO TABS: 4.0000 mg | ORAL_TABLET | Freq: Every day | ORAL | 3 refills | Status: DC

## 2020-11-04 MED ORDER — LISINOPRIL 20 MG PO TABS: 20.0000 mg | ORAL_TABLET | Freq: Two times a day (BID) | ORAL | 3 refills | Status: DC

## 2020-11-04 MED ORDER — OMEPRAZOLE 40 MG PO CPDR: 40.0000 mg | DELAYED_RELEASE_CAPSULE | Freq: Every day | ORAL | 3 refills | Status: DC

## 2020-11-04 MED ORDER — LEVOTHYROXINE SODIUM 125 MCG PO TABS: ORAL_TABLET | ORAL | 3 refills | Status: DC

## 2020-11-04 NOTE — Progress Notes (Signed)
Subjective:  [name], female    DOB: 1946-02-10  Age: 75 y.o. MRN: [id number]  CC: Medical Management of Chronic Issues   HPI Erin Ortiz presents forFollow-up of diabetes. Patient checks blood sugar at home.   120  fasting and 112-120 3 hours postprandial Patient denies symptoms such as polyuria, polydipsia, excessive hunger, nausea No significant hypoglycemic spells noted. Medications reviewed. Pt reports taking them regularly without complication/adverse reaction being reported today.   Joints stable currentl at 3 mg of prednisone daily.   . follow-up on  thyroid. The patient has a history of hypothyroidism for many years. It has been stable recently. Pt. denies any change in  voice, loss of hair, heat or cold intolerance. Energy level has been adequate to good. Patient denies constipation and diarrhea. No myxedema. Medication is as noted below. Verified that pt is taking it daily on an empty stomach. Well tolerated. She went through a short time of being tired a lot, but improved a few weeks after starting Slow iron OTC 45 mg of iron Bid.  History Erin Ortiz has a past medical history of Anemia, Chronic kidney disease, and Diabetes mellitus without complication ([place]).   She has a past surgical history that includes jejunostomy (05/17/2008); Gastric bypass (11/2001); and Vaginal hysterectomy (12/1986).   Her family history includes Cancer in her father and mother.She reports that she has never smoked. She has never used smokeless tobacco. She reports that she does not drink alcohol and does not use drugs.  Current Outpatient Medications on File Prior to Visit  Medication Sig Dispense Refill   atorvastatin (LIPITOR) 40 MG tablet Take 1 tablet by mouth once daily 90 tablet 1   Blood Glucose Monitoring Suppl (ONE TOUCH ULTRA SYSTEM KIT) W/DEVICE KIT 1 kit by Does not apply route once. 1 each 0   cyanocobalamin (,VITAMIN B-12,) 1000 MCG/ML injection Inject 1 mL (1,000 mcg  total) into the skin every 30 (thirty) days. 10 mL prn   Cyanocobalamin 1000 MCG/ML KIT Give 1 ml from one vial and 0.2 ml from second vial monthly 4 kit 3   glucose blood (ONETOUCH ULTRA) test strip Test BS TID Dx E11.21 100 each 3   Multiple Minerals-Vitamins (CALCIUM-MAGNESIUM-ZINC-D3 PO) Take by mouth 3 (three) times daily.     Pediatric Multiple Vit-C-FA (FLINSTONES GUMMIES OMEGA-3 DHA) CHEW Chew by mouth.     vitamin C (ASCORBIC ACID) 500 MG tablet Take 500 mg by mouth daily.     Current Facility-Administered Medications on File Prior to Visit  Medication Dose Route Frequency Provider Last Rate Last Admin   cyanocobalamin ((VITAMIN B-12)) injection 1,000 mcg  1,000 mcg Intramuscular Q30 days Claretta Fraise, MD   1,000 mcg at 10/19/20 1604    ROS Review of Systems  Objective:  BP 116/71   Pulse 82   Temp 97.9 F (36.6 C)   Ht _0  (1.626 m)   Wt 193 lb 3.2 oz (87.6 kg)   LMP 04/11/1986   SpO2 99%   BMI 33.16 kg/m   BP Readings from Last 3 Encounters:  11/04/20 116/71  08/05/20 125/80  05/07/20 132/82    Wt Readings from Last 3 Encounters:  11/04/20 193 lb 3.2 oz (87.6 kg)  08/05/20 190 lb 6.4 oz (86.4 kg)  05/07/20 183 lb (83 kg)     Physical Exam    Assessment & Plan:   Erin Ortiz was seen today for medical management of chronic issues.  Diagnoses and all orders for this visit:  Type 2 diabetes mellitus with stage 3 chronic kidney disease, without long-term current use of insulin, unspecified whether stage 3a or 3b CKD (HCC) -     Bayer DCA Hb A1c Waived -     CBC with Differential/Platelet -     metFORMIN (GLUCOPHAGE-XR) 500 MG 24 hr tablet; Take 1 tablet (500 mg total) by mouth 2 (two) times daily.  Essential (primary) hypertension -     CMP14+EGFR  Acquired hypothyroidism -     TSH + free T4  Mixed hyperlipidemia -     Lipid panel  Iron deficiency anemia, unspecified iron deficiency anemia type -     Iron, TIBC and Ferritin Panel  Other  orders -     levothyroxine (EUTHYROX) 125 MCG tablet; TAKE 1 TABLET BY MOUTH IN THE MORNING 30  MINUTES  BEFORE  MEAL -     lisinopril (ZESTRIL) 20 MG tablet; Take 1 tablet (20 mg total) by mouth 2 (two) times daily. -     omeprazole (PRILOSEC) 40 MG capsule; Take 1 capsule (40 mg total) by mouth daily. -     raloxifene (EVISTA) 60 MG tablet; Take 1 tablet (60 mg total) by mouth daily. -     predniSONE (DELTASONE) 1 MG tablet; Take 4 tablets (4 mg total) by mouth daily.     I have changed [name]'s Euthyrox to levothyroxine. I have also changed her metFORMIN, omeprazole, and raloxifene. I am also having her maintain her ONE TOUCH ULTRA SYSTEM KIT, Multiple Minerals-Vitamins (CALCIUM-MAGNESIUM-ZINC-D3 PO), Flinstones Gummies Omega-3 DHA, vitamin C, Cyanocobalamin, cyanocobalamin, OneTouch Ultra, atorvastatin, lisinopril, and predniSONE. We will stop administering betamethasone acetate-betamethasone sodium phosphate. Additionally, we will continue to administer cyanocobalamin.  Meds ordered this encounter  Medications   levothyroxine (EUTHYROX) 125 MCG tablet    Sig: TAKE 1 TABLET BY MOUTH IN THE MORNING 30  MINUTES  BEFORE  MEAL    Dispense:  90 tablet    Refill:  3   lisinopril (ZESTRIL) 20 MG tablet    Sig: Take 1 tablet (20 mg total) by mouth 2 (two) times daily.    Dispense:  180 tablet    Refill:  3   metFORMIN (GLUCOPHAGE-XR) 500 MG 24 hr tablet    Sig: Take 1 tablet (500 mg total) by mouth 2 (two) times daily.    Dispense:  180 tablet    Refill:  3   omeprazole (PRILOSEC) 40 MG capsule    Sig: Take 1 capsule (40 mg total) by mouth daily.    Dispense:  90 capsule    Refill:  3   raloxifene (EVISTA) 60 MG tablet    Sig: Take 1 tablet (60 mg total) by mouth daily.    Dispense:  90 tablet    Refill:  3   predniSONE (DELTASONE) 1 MG tablet    Sig: Take 4 tablets (4 mg total) by mouth daily.    Dispense:  360 tablet    Refill:  3     Follow-up: No follow-ups on  file.  Claretta Fraise, M.D.

## 2020-11-05 LAB — CMP14+EGFR
ALT: 11 IU/L (ref 0–32)
AST: 12 IU/L (ref 0–40)
Albumin/Globulin Ratio: 1.1 — ABNORMAL LOW (ref 1.2–2.2)
Albumin: 3.3 g/dL — ABNORMAL LOW (ref 3.7–4.7)
Alkaline Phosphatase: 150 IU/L — ABNORMAL HIGH (ref 44–121)
BUN/Creatinine Ratio: 15 (ref 12–28)
BUN: 22 mg/dL (ref 8–27)
Bilirubin Total: 0.3 mg/dL (ref 0.0–1.2)
CO2: 19 mmol/L — ABNORMAL LOW (ref 20–29)
Calcium: 8.4 mg/dL — ABNORMAL LOW (ref 8.7–10.3)
Chloride: 111 mmol/L — ABNORMAL HIGH (ref 96–106)
Creatinine, Ser: 1.42 mg/dL — ABNORMAL HIGH (ref 0.57–1.00)
Globulin, Total: 3.1 g/dL (ref 1.5–4.5)
Glucose: 128 mg/dL — ABNORMAL HIGH (ref 65–99)
Potassium: 4.8 mmol/L (ref 3.5–5.2)
Sodium: 142 mmol/L (ref 134–144)
Total Protein: 6.4 g/dL (ref 6.0–8.5)
eGFR: 39 mL/min/{1.73_m2} — ABNORMAL LOW (ref >59)

## 2020-11-05 LAB — CBC WITH DIFFERENTIAL/PLATELET
Basophils Absolute: 0.1 10*3/uL (ref 0.0–0.2)
Basos: 1 %
EOS (ABSOLUTE): 0.1 10*3/uL (ref 0.0–0.4)
Eos: 2 %
Hematocrit: 33.9 % — ABNORMAL LOW (ref 34.0–46.6)
Hemoglobin: 10.2 g/dL — ABNORMAL LOW (ref 11.1–15.9)
Immature Grans (Abs): 0 10*3/uL (ref 0.0–0.1)
Immature Granulocytes: 0 %
Lymphocytes Absolute: 1.1 10*3/uL (ref 0.7–3.1)
Lymphs: 15 %
MCH: 27.4 pg (ref 26.6–33.0)
MCHC: 30.1 g/dL — ABNORMAL LOW (ref 31.5–35.7)
MCV: 91 fL (ref 79–97)
Monocytes Absolute: 0.5 10*3/uL (ref 0.1–0.9)
Monocytes: 6 %
Neutrophils Absolute: 5.4 10*3/uL (ref 1.4–7.0)
Neutrophils: 76 %
Platelets: 188 10*3/uL (ref 150–450)
RBC: 3.72 x10E6/uL — ABNORMAL LOW (ref 3.77–5.28)
RDW: 14 % (ref 11.7–15.4)
WBC: 7.2 10*3/uL (ref 3.4–10.8)

## 2020-11-05 LAB — LIPID PANEL
Chol/HDL Ratio: 2.7 ratio (ref 0.0–4.4)
Cholesterol, Total: 147 mg/dL (ref 100–199)
HDL: 54 mg/dL (ref >39)
LDL Chol Calc (NIH): 75 mg/dL (ref 0–99)
Triglycerides: 94 mg/dL (ref 0–149)
VLDL Cholesterol Cal: 18 mg/dL (ref 5–40)

## 2020-11-05 LAB — TSH+FREE T4
Free T4: 1.24 ng/dL (ref 0.82–1.77)
TSH: 0.138 u[IU]/mL — ABNORMAL LOW (ref 0.450–4.500)

## 2020-11-05 NOTE — Progress Notes (Signed)
Hello Getsemani,  Your lab result is normal and/or stable.Some minor variations that are not significant are commonly marked abnormal, but do not represent any medical problem for you.  Best regards, Sharlet Notaro, M.D.

## 2020-11-07 LAB — IRON,TIBC AND FERRITIN PANEL
Ferritin: 317 ng/mL — ABNORMAL HIGH (ref 15–150)
Iron Saturation: 28 % (ref 15–55)
Iron: 66 ug/dL (ref 27–139)
Total Iron Binding Capacity: 239 ug/dL — ABNORMAL LOW (ref 250–450)
UIBC: 173 ug/dL (ref 118–369)

## 2020-11-07 LAB — SPECIMEN STATUS REPORT

## 2020-11-19 ENCOUNTER — Ambulatory Visit (INDEPENDENT_AMBULATORY_CARE_PROVIDER_SITE_OTHER): Payer: Medicare PPO

## 2020-11-19 ENCOUNTER — Other Ambulatory Visit: Payer: Self-pay

## 2020-11-19 DIAGNOSIS — E538 Deficiency of other specified B group vitamins: Secondary | ICD-10-CM | POA: Diagnosis not present

## 2020-11-19 NOTE — Progress Notes (Signed)
Cyanocobalamin injection given to right deltoid per patient request.  Patient tolerated well. 

## 2020-12-21 ENCOUNTER — Ambulatory Visit: Payer: Medicare PPO

## 2020-12-22 ENCOUNTER — Ambulatory Visit (INDEPENDENT_AMBULATORY_CARE_PROVIDER_SITE_OTHER): Payer: Medicare PPO

## 2020-12-22 ENCOUNTER — Other Ambulatory Visit: Payer: Self-pay

## 2020-12-22 DIAGNOSIS — E538 Deficiency of other specified B group vitamins: Secondary | ICD-10-CM

## 2020-12-22 MED ORDER — LEVOTHYROXINE SODIUM 125 MCG PO TABS: ORAL_TABLET | ORAL | 1 refills | Status: DC

## 2020-12-22 MED ORDER — LISINOPRIL 20 MG PO TABS: 20.0000 mg | ORAL_TABLET | Freq: Two times a day (BID) | ORAL | 1 refills | Status: DC

## 2020-12-22 NOTE — Telephone Encounter (Signed)
Needs refill of Lisinopril and Levothyroxine sent to [facility].

## 2020-12-22 NOTE — Progress Notes (Signed)
Cyanocobalamin injection given to left deltoid.  Patient tolerated well. 

## 2021-02-04 ENCOUNTER — Ambulatory Visit: Payer: Medicare PPO | Admitting: Family Medicine

## 2021-02-04 ENCOUNTER — Encounter: Payer: Self-pay | Admitting: Family Medicine

## 2021-02-04 ENCOUNTER — Other Ambulatory Visit: Payer: Self-pay

## 2021-02-04 VITALS — BP 124/72 | HR 76 | Temp 97.7°F | Ht 64.0 in | Wt 186.2 lb

## 2021-02-04 DIAGNOSIS — I10 Essential (primary) hypertension: Secondary | ICD-10-CM

## 2021-02-04 DIAGNOSIS — D509 Iron deficiency anemia, unspecified: Secondary | ICD-10-CM

## 2021-02-04 DIAGNOSIS — E538 Deficiency of other specified B group vitamins: Secondary | ICD-10-CM | POA: Diagnosis not present

## 2021-02-04 DIAGNOSIS — E1122 Type 2 diabetes mellitus with diabetic chronic kidney disease: Secondary | ICD-10-CM

## 2021-02-04 DIAGNOSIS — N183 Chronic kidney disease, stage 3 unspecified: Secondary | ICD-10-CM

## 2021-02-04 DIAGNOSIS — E782 Mixed hyperlipidemia: Secondary | ICD-10-CM | POA: Diagnosis not present

## 2021-02-04 DIAGNOSIS — E039 Hypothyroidism, unspecified: Secondary | ICD-10-CM

## 2021-02-04 DIAGNOSIS — Z23 Encounter for immunization: Secondary | ICD-10-CM | POA: Diagnosis not present

## 2021-02-04 LAB — BAYER DCA HB A1C WAIVED: HB A1C (BAYER DCA - WAIVED): 6 % — ABNORMAL HIGH (ref 4.8–5.6)

## 2021-02-04 NOTE — Progress Notes (Signed)
Subjective:  [name],  female    DOB: January 11, 1946  Age: 75 y.o.    CC: Medical Management of Chronic Issues   HPI Erin Ortiz presents for  follow-up of hypertension. Patient has no history of headache chest pain or shortness of breath or recent cough. Patient also denies symptoms of TIA such as numbness weakness lateralizing. Patient denies side effects from medication. States taking it regularly.  Patient also  in for follow-up of elevated cholesterol. Doing well without complaints on current medication. Denies side effects  including myalgia and arthralgia and nausea. Also in today for liver function testing. Currently no chest pain, shortness of breath or other cardiovascular related symptoms noted.  Follow-up of diabetes. Patient does check blood sugar at home. Readings run between 100 and 130 Patient denies symptoms such as excessive hunger or urinary frequency, excessive hunger, nausea Occasional mild hypoglycemic spells noted. Medications reviewed. Pt reports taking them regularly. Pt. denies complication/adverse reaction today.    History Erin Ortiz has a past medical history of Anemia, Chronic kidney disease, and Diabetes mellitus without complication ([place]).   She has a past surgical history that includes jejunostomy (05/17/2008); Gastric bypass (11/2001); and Vaginal hysterectomy (12/1986).   Her family history includes Cancer in her father and mother.She reports that she has never smoked. She has never used smokeless tobacco. She reports that she does not drink alcohol and does not use drugs.  Current Outpatient Medications on File Prior to Visit  Medication Sig Dispense Refill   Blood Glucose Monitoring Suppl (ONE TOUCH ULTRA SYSTEM KIT) W/DEVICE KIT 1 kit by Does not apply route once. 1 each 0   cyanocobalamin (,VITAMIN B-12,) 1000 MCG/ML injection Inject 1 mL (1,000 mcg total) into the skin every 30 (thirty) days. 10 mL prn   Cyanocobalamin 1000 MCG/ML KIT  Give 1 ml from one vial and 0.2 ml from second vial monthly 4 kit 3   glucose blood (ONETOUCH ULTRA) test strip Test BS TID Dx E11.21 100 each 3   levothyroxine (EUTHYROX) 125 MCG tablet TAKE 1 TABLET BY MOUTH IN THE MORNING 30  MINUTES  BEFORE  MEAL 90 tablet 1   lisinopril (ZESTRIL) 20 MG tablet Take 1 tablet (20 mg total) by mouth 2 (two) times daily. 180 tablet 1   metFORMIN (GLUCOPHAGE-XR) 500 MG 24 hr tablet Take 1 tablet (500 mg total) by mouth 2 (two) times daily. (Patient taking differently: Take 500 mg by mouth daily with breakfast.) 180 tablet 3   Multiple Minerals-Vitamins (CALCIUM-MAGNESIUM-ZINC-D3 PO) Take by mouth 3 (three) times daily.     omeprazole (PRILOSEC) 40 MG capsule Take 1 capsule (40 mg total) by mouth daily. 90 capsule 3   Pediatric Multiple Vit-C-FA (FLINSTONES GUMMIES OMEGA-3 DHA) CHEW Chew by mouth.     predniSONE (DELTASONE) 1 MG tablet Take 4 tablets (4 mg total) by mouth daily. (Patient taking differently: Take 3 mg by mouth daily.) 360 tablet 3   raloxifene (EVISTA) 60 MG tablet Take 1 tablet (60 mg total) by mouth daily. 90 tablet 3   vitamin C (ASCORBIC ACID) 500 MG tablet Take 500 mg by mouth daily.     Current Facility-Administered Medications on File Prior to Visit  Medication Dose Route Frequency Provider Last Rate Last Admin   cyanocobalamin ((VITAMIN B-12)) injection 1,000 mcg  1,000 mcg Intramuscular Q30 days Claretta Fraise, MD   1,000 mcg at 02/04/21 1053    ROS Review of Systems  Constitutional: Negative.   HENT: Negative.  Eyes:  Negative for visual disturbance.  Respiratory:  Negative for shortness of breath.   Cardiovascular:  Negative for chest pain.  Gastrointestinal:  Negative for abdominal pain.  Musculoskeletal:  Negative for arthralgias.   Objective:  BP 124/72   Pulse 76   Temp 97.7 F (36.5 C)   Ht 5' 4"  (1.626 m)   Wt 186 lb 3.2 oz (84.5 kg)   LMP 04/11/1986   SpO2 98%   BMI 31.96 kg/m   BP Readings from Last 3  Encounters:  02/04/21 124/72  11/04/20 116/71  08/05/20 125/80    Wt Readings from Last 3 Encounters:  02/04/21 186 lb 3.2 oz (84.5 kg)  11/04/20 193 lb 3.2 oz (87.6 kg)  08/05/20 190 lb 6.4 oz (86.4 kg)     Physical Exam Constitutional:      General: She is not in acute distress.    Appearance: She is well-developed.  Cardiovascular:     Rate and Rhythm: Normal rate and regular rhythm.  Pulmonary:     Breath sounds: Normal breath sounds.  Musculoskeletal:        General: Normal range of motion.  Skin:    General: Skin is warm and dry.  Neurological:     Mental Status: She is alert and oriented to person, place, and time.    Results for orders placed or performed in visit on 02/04/21  Bayer DCA Hb A1c Waived  Result Value Ref Range   HB A1C (BAYER DCA - WAIVED) 6.0 (H) 4.8 - 5.6 %  CBC with Differential/Platelet  Result Value Ref Range   WBC 6.4 3.4 - 10.8 x10E3/uL   RBC 3.80 3.77 - 5.28 x10E6/uL   Hemoglobin 10.7 (L) 11.1 - 15.9 g/dL   Hematocrit 34.2 34.0 - 46.6 %   MCV 90 79 - 97 fL   MCH 28.2 26.6 - 33.0 pg   MCHC 31.3 (L) 31.5 - 35.7 g/dL   RDW 13.6 11.7 - 15.4 %   Platelets 189 150 - 450 x10E3/uL   Neutrophils 73 Not Estab. %   Lymphs 17 Not Estab. %   Monocytes 7 Not Estab. %   Eos 2 Not Estab. %   Basos 1 Not Estab. %   Neutrophils Absolute 4.6 1.4 - 7.0 x10E3/uL   Lymphocytes Absolute 1.1 0.7 - 3.1 x10E3/uL   Monocytes Absolute 0.4 0.1 - 0.9 x10E3/uL   EOS (ABSOLUTE) 0.2 0.0 - 0.4 x10E3/uL   Basophils Absolute 0.1 0.0 - 0.2 x10E3/uL   Immature Granulocytes 0 Not Estab. %   Immature Grans (Abs) 0.0 0.0 - 0.1 x10E3/uL  CMP14+EGFR  Result Value Ref Range   Glucose 123 (H) 70 - 99 mg/dL   BUN 22 8 - 27 mg/dL   Creatinine, Ser 1.42 (H) 0.57 - 1.00 mg/dL   eGFR 38 (L) >59 mL/min/1.73   BUN/Creatinine Ratio 15 12 - 28   Sodium 140 134 - 144 mmol/L   Potassium 4.7 3.5 - 5.2 mmol/L   Chloride 106 96 - 106 mmol/L   CO2 21 20 - 29 mmol/L   Calcium 8.8  8.7 - 10.3 mg/dL   Total Protein 6.0 6.0 - 8.5 g/dL   Albumin 3.9 3.7 - 4.7 g/dL   Globulin, Total 2.1 1.5 - 4.5 g/dL   Albumin/Globulin Ratio 1.9 1.2 - 2.2   Bilirubin Total 0.4 0.0 - 1.2 mg/dL   Alkaline Phosphatase 148 (H) 44 - 121 IU/L   AST 16 0 - 40 IU/L   ALT 13  0 - 32 IU/L  Lipid panel  Result Value Ref Range   Cholesterol, Total 149 100 - 199 mg/dL   Triglycerides 114 0 - 149 mg/dL   HDL 50 >39 mg/dL   VLDL Cholesterol Cal 21 5 - 40 mg/dL   LDL Chol Calc (NIH) 78 0 - 99 mg/dL   Chol/HDL Ratio 3.0 0.0 - 4.4 ratio  Iron, TIBC and Ferritin Panel  Result Value Ref Range   Total Iron Binding Capacity 247 (L) 250 - 450 ug/dL   UIBC 185 118 - 369 ug/dL   Iron 62 27 - 139 ug/dL   Iron Saturation 25 15 - 55 %   Ferritin 343 (H) 15 - 150 ng/mL  TSH + free T4  Result Value Ref Range   TSH 0.280 (L) 0.450 - 4.500 uIU/mL   Free T4 1.62 0.82 - 1.77 ng/dL        Assessment & Plan:   Neta was seen today for medical management of chronic issues.  Diagnoses and all orders for this visit:  Type 2 diabetes mellitus with stage 3 chronic kidney disease, without long-term current use of insulin, unspecified whether stage 3a or 3b CKD (HCC) -     Bayer DCA Hb A1c Waived -     CBC with Differential/Platelet  Essential (primary) hypertension -     CMP14+EGFR  Acquired hypothyroidism -     TSH + free T4  Mixed hyperlipidemia -     Lipid panel  Iron deficiency anemia, unspecified iron deficiency anemia type -     Iron, TIBC and Ferritin Panel  Need for immunization against influenza -     Flu Vaccine QUAD High Dose(Fluad)  Other orders -     atorvastatin (LIPITOR) 40 MG tablet; Take 1 tablet (40 mg total) by mouth daily.  I have changed Glorious Abundis's atorvastatin. I am also having her maintain her [employer] KIT, Multiple Minerals-Vitamins (CALCIUM-MAGNESIUM-ZINC-D3 PO), Flinstones Gummies Omega-3 DHA, vitamin C, Cyanocobalamin, cyanocobalamin, OneTouch  Ultra, metFORMIN, omeprazole, raloxifene, predniSONE, lisinopril, and levothyroxine. We administered cyanocobalamin. We will continue to administer cyanocobalamin.  Meds ordered this encounter  Medications   atorvastatin (LIPITOR) 40 MG tablet    Sig: Take 1 tablet (40 mg total) by mouth daily.    Dispense:  90 tablet    Refill:  3      Follow-up: Return in about 3 months (around 05/07/2021).  Claretta Fraise, M.D.

## 2021-02-05 LAB — CBC WITH DIFFERENTIAL/PLATELET
Basophils Absolute: 0.1 10*3/uL (ref 0.0–0.2)
Basos: 1 %
EOS (ABSOLUTE): 0.2 10*3/uL (ref 0.0–0.4)
Eos: 2 %
Hematocrit: 34.2 % (ref 34.0–46.6)
Hemoglobin: 10.7 g/dL — ABNORMAL LOW (ref 11.1–15.9)
Immature Grans (Abs): 0 10*3/uL (ref 0.0–0.1)
Immature Granulocytes: 0 %
Lymphocytes Absolute: 1.1 10*3/uL (ref 0.7–3.1)
Lymphs: 17 %
MCH: 28.2 pg (ref 26.6–33.0)
MCHC: 31.3 g/dL — ABNORMAL LOW (ref 31.5–35.7)
MCV: 90 fL (ref 79–97)
Monocytes Absolute: 0.4 10*3/uL (ref 0.1–0.9)
Monocytes: 7 %
Neutrophils Absolute: 4.6 10*3/uL (ref 1.4–7.0)
Neutrophils: 73 %
Platelets: 189 10*3/uL (ref 150–450)
RBC: 3.8 x10E6/uL (ref 3.77–5.28)
RDW: 13.6 % (ref 11.7–15.4)
WBC: 6.4 10*3/uL (ref 3.4–10.8)

## 2021-02-05 LAB — CMP14+EGFR
ALT: 13 IU/L (ref 0–32)
AST: 16 IU/L (ref 0–40)
Albumin/Globulin Ratio: 1.9 (ref 1.2–2.2)
Albumin: 3.9 g/dL (ref 3.7–4.7)
Alkaline Phosphatase: 148 IU/L — ABNORMAL HIGH (ref 44–121)
BUN/Creatinine Ratio: 15 (ref 12–28)
BUN: 22 mg/dL (ref 8–27)
Bilirubin Total: 0.4 mg/dL (ref 0.0–1.2)
CO2: 21 mmol/L (ref 20–29)
Calcium: 8.8 mg/dL (ref 8.7–10.3)
Chloride: 106 mmol/L (ref 96–106)
Creatinine, Ser: 1.42 mg/dL — ABNORMAL HIGH (ref 0.57–1.00)
Globulin, Total: 2.1 g/dL (ref 1.5–4.5)
Glucose: 123 mg/dL — ABNORMAL HIGH (ref 70–99)
Potassium: 4.7 mmol/L (ref 3.5–5.2)
Sodium: 140 mmol/L (ref 134–144)
Total Protein: 6 g/dL (ref 6.0–8.5)
eGFR: 38 mL/min/{1.73_m2} — ABNORMAL LOW (ref >59)

## 2021-02-05 LAB — IRON,TIBC AND FERRITIN PANEL
Ferritin: 343 ng/mL — ABNORMAL HIGH (ref 15–150)
Iron Saturation: 25 % (ref 15–55)
Iron: 62 ug/dL (ref 27–139)
Total Iron Binding Capacity: 247 ug/dL — ABNORMAL LOW (ref 250–450)
UIBC: 185 ug/dL (ref 118–369)

## 2021-02-05 LAB — LIPID PANEL
Chol/HDL Ratio: 3 ratio (ref 0.0–4.4)
Cholesterol, Total: 149 mg/dL (ref 100–199)
HDL: 50 mg/dL (ref >39)
LDL Chol Calc (NIH): 78 mg/dL (ref 0–99)
Triglycerides: 114 mg/dL (ref 0–149)
VLDL Cholesterol Cal: 21 mg/dL (ref 5–40)

## 2021-02-05 LAB — TSH+FREE T4
Free T4: 1.62 ng/dL (ref 0.82–1.77)
TSH: 0.28 u[IU]/mL — ABNORMAL LOW (ref 0.450–4.500)

## 2021-02-06 NOTE — Progress Notes (Signed)
Please tell pt. lab result is stable.Some minor anemia and weakness of the kidney are present, but do not represent any medical problem for you.  Best regards, Mechele Claude, M.D.

## 2021-02-07 ENCOUNTER — Encounter: Payer: Self-pay | Admitting: Family Medicine

## 2021-02-07 MED ORDER — ATORVASTATIN CALCIUM 40 MG PO TABS: 40.0000 mg | ORAL_TABLET | Freq: Every day | ORAL | 3 refills | Status: DC

## 2021-02-22 DIAGNOSIS — L579 Skin changes due to chronic exposure to nonionizing radiation, unspecified: Secondary | ICD-10-CM | POA: Diagnosis not present

## 2021-02-22 DIAGNOSIS — Z85828 Personal history of other malignant neoplasm of skin: Secondary | ICD-10-CM | POA: Diagnosis not present

## 2021-02-22 DIAGNOSIS — L570 Actinic keratosis: Secondary | ICD-10-CM | POA: Diagnosis not present

## 2021-02-22 DIAGNOSIS — B079 Viral wart, unspecified: Secondary | ICD-10-CM | POA: Diagnosis not present

## 2021-03-09 ENCOUNTER — Other Ambulatory Visit: Payer: Self-pay

## 2021-03-09 ENCOUNTER — Ambulatory Visit (INDEPENDENT_AMBULATORY_CARE_PROVIDER_SITE_OTHER): Payer: Medicare PPO

## 2021-03-09 DIAGNOSIS — E538 Deficiency of other specified B group vitamins: Secondary | ICD-10-CM | POA: Diagnosis not present

## 2021-03-09 NOTE — Progress Notes (Signed)
Cyanocobalamin injection given to right deltoid.  Patient tolerated well. 

## 2021-03-18 ENCOUNTER — Telehealth: Payer: Self-pay | Admitting: Family Medicine

## 2021-03-18 NOTE — Telephone Encounter (Signed)
Vernona Rieger called from [facility] to get verbal approval for patients thyroid medicine.  Says medicine will still be Levothyroxine but brand they have to get it from now is called Accord.   Gave verbal ok for this.  If not ok, please call pharmacy.

## 2021-04-08 ENCOUNTER — Ambulatory Visit (INDEPENDENT_AMBULATORY_CARE_PROVIDER_SITE_OTHER): Payer: Medicare PPO

## 2021-04-08 DIAGNOSIS — E538 Deficiency of other specified B group vitamins: Secondary | ICD-10-CM

## 2021-04-08 NOTE — Progress Notes (Signed)
Cyanocobalamin injection given to left deltoid.  Patient tolerated well. 

## 2021-05-06 ENCOUNTER — Ambulatory Visit: Payer: Medicare PPO | Admitting: Family Medicine

## 2021-05-12 ENCOUNTER — Ambulatory Visit: Payer: Medicare PPO | Admitting: Family Medicine

## 2021-05-12 ENCOUNTER — Telehealth: Payer: Self-pay | Admitting: Family Medicine

## 2021-05-12 ENCOUNTER — Encounter: Payer: Self-pay | Admitting: Family Medicine

## 2021-05-12 VITALS — BP 113/70 | HR 81 | Temp 97.7°F | Ht 64.0 in | Wt 185.0 lb

## 2021-05-12 DIAGNOSIS — N183 Chronic kidney disease, stage 3 unspecified: Secondary | ICD-10-CM | POA: Diagnosis not present

## 2021-05-12 DIAGNOSIS — E1122 Type 2 diabetes mellitus with diabetic chronic kidney disease: Secondary | ICD-10-CM | POA: Diagnosis not present

## 2021-05-12 DIAGNOSIS — D509 Iron deficiency anemia, unspecified: Secondary | ICD-10-CM | POA: Diagnosis not present

## 2021-05-12 DIAGNOSIS — E039 Hypothyroidism, unspecified: Secondary | ICD-10-CM | POA: Diagnosis not present

## 2021-05-12 DIAGNOSIS — E782 Mixed hyperlipidemia: Secondary | ICD-10-CM

## 2021-05-12 DIAGNOSIS — E538 Deficiency of other specified B group vitamins: Secondary | ICD-10-CM | POA: Diagnosis not present

## 2021-05-12 DIAGNOSIS — I10 Essential (primary) hypertension: Secondary | ICD-10-CM | POA: Diagnosis not present

## 2021-05-12 LAB — BAYER DCA HB A1C WAIVED: HB A1C (BAYER DCA - WAIVED): 6.2 % — ABNORMAL HIGH (ref 4.8–5.6)

## 2021-05-12 NOTE — Telephone Encounter (Signed)
Pt would like to schedule a DEXA scan for April. Please call back to schedule.

## 2021-05-13 LAB — CBC WITH DIFFERENTIAL/PLATELET
Basophils Absolute: 0.1 10*3/uL (ref 0.0–0.2)
Basos: 1 %
EOS (ABSOLUTE): 0.1 10*3/uL (ref 0.0–0.4)
Eos: 1 %
Hematocrit: 34.3 % (ref 34.0–46.6)
Hemoglobin: 11.1 g/dL (ref 11.1–15.9)
Immature Grans (Abs): 0 10*3/uL (ref 0.0–0.1)
Immature Granulocytes: 0 %
Lymphocytes Absolute: 1 10*3/uL (ref 0.7–3.1)
Lymphs: 14 %
MCH: 28.4 pg (ref 26.6–33.0)
MCHC: 32.4 g/dL (ref 31.5–35.7)
MCV: 88 fL (ref 79–97)
Monocytes Absolute: 0.4 10*3/uL (ref 0.1–0.9)
Monocytes: 6 %
Neutrophils Absolute: 5.5 10*3/uL (ref 1.4–7.0)
Neutrophils: 78 %
Platelets: 213 10*3/uL (ref 150–450)
RBC: 3.91 x10E6/uL (ref 3.77–5.28)
RDW: 13.7 % (ref 11.7–15.4)
WBC: 7.1 10*3/uL (ref 3.4–10.8)

## 2021-05-13 LAB — CMP14+EGFR
ALT: 15 IU/L (ref 0–32)
AST: 15 IU/L (ref 0–40)
Albumin/Globulin Ratio: 1.5 (ref 1.2–2.2)
Albumin: 3.8 g/dL (ref 3.7–4.7)
Alkaline Phosphatase: 124 IU/L — ABNORMAL HIGH (ref 44–121)
BUN/Creatinine Ratio: 12 (ref 12–28)
BUN: 15 mg/dL (ref 8–27)
Bilirubin Total: 0.3 mg/dL (ref 0.0–1.2)
CO2: 22 mmol/L (ref 20–29)
Calcium: 8.6 mg/dL — ABNORMAL LOW (ref 8.7–10.3)
Chloride: 105 mmol/L (ref 96–106)
Creatinine, Ser: 1.27 mg/dL — ABNORMAL HIGH (ref 0.57–1.00)
Globulin, Total: 2.5 g/dL (ref 1.5–4.5)
Glucose: 120 mg/dL — ABNORMAL HIGH (ref 70–99)
Potassium: 5.3 mmol/L — ABNORMAL HIGH (ref 3.5–5.2)
Sodium: 140 mmol/L (ref 134–144)
Total Protein: 6.3 g/dL (ref 6.0–8.5)
eGFR: 44 mL/min/{1.73_m2} — ABNORMAL LOW (ref >59)

## 2021-05-13 LAB — LIPID PANEL
Chol/HDL Ratio: 2.6 ratio (ref 0.0–4.4)
Cholesterol, Total: 151 mg/dL (ref 100–199)
HDL: 59 mg/dL (ref >39)
LDL Chol Calc (NIH): 77 mg/dL (ref 0–99)
Triglycerides: 76 mg/dL (ref 0–149)
VLDL Cholesterol Cal: 15 mg/dL (ref 5–40)

## 2021-05-13 LAB — TSH+FREE T4
Free T4: 1.45 ng/dL (ref 0.82–1.77)
TSH: 0.466 u[IU]/mL (ref 0.450–4.500)

## 2021-05-13 LAB — IRON,TIBC AND FERRITIN PANEL
Ferritin: 318 ng/mL — ABNORMAL HIGH (ref 15–150)
Iron Saturation: 26 % (ref 15–55)
Iron: 65 ug/dL (ref 27–139)
Total Iron Binding Capacity: 248 ug/dL — ABNORMAL LOW (ref 250–450)
UIBC: 183 ug/dL (ref 118–369)

## 2021-05-13 NOTE — Telephone Encounter (Signed)
No answer, no voicemail.

## 2021-05-13 NOTE — Telephone Encounter (Signed)
Appt made for dexa

## 2021-05-13 NOTE — Telephone Encounter (Signed)
Patient aware and verbalized understanding. Has appt in 05/01 with [facility] and wants dexa this day

## 2021-05-14 ENCOUNTER — Encounter: Payer: Self-pay | Admitting: Family Medicine

## 2021-05-14 MED ORDER — LEVOTHYROXINE SODIUM 125 MCG PO TABS: ORAL_TABLET | ORAL | 3 refills | Status: DC

## 2021-05-14 MED ORDER — LISINOPRIL 20 MG PO TABS: 20.0000 mg | ORAL_TABLET | Freq: Two times a day (BID) | ORAL | 3 refills | Status: DC

## 2021-05-14 NOTE — Progress Notes (Signed)
Subjective:  [name], female    DOB: [birth year]  Age: 76 y.o. MRN: [id number]  CC: Medical Management of Chronic Issues   HPI Erin Ortiz presents forFollow-up of diabetes. Patient checks blood sugar at home.   100 fasting and 150 postprandial Patient denies symptoms such as polyuria, polydipsia, excessive hunger, nausea No significant hypoglycemic spells noted. Medications reviewed. Pt reports taking them regularly without complication/adverse reaction being reported today.   presents for  follow-up of hypertension. Patient has no history of headache chest pain or shortness of breath or recent cough. Patient also denies symptoms of TIA such as focal numbness or weakness. Patient denies side effects from medication. States taking it regularly.   in for follow-up of elevated cholesterol. Doing well without complaints on current medication. Denies side effects of statin including myalgia and arthralgia and nausea. Currently no chest pain, shortness of breath or other cardiovascular related symptoms noted.   follow-up on  thyroid. The patient has a history of hypothyroidism for many years. It has been stable recently. Pt. denies any change in  voice, loss of hair, heat or cold intolerance. Energy level has been adequate to good. Patient denies constipation and diarrhea. No myxedema. Medication is as noted below. Verified that pt is taking it daily on an empty stomach. Well tolerated.    History Erin Ortiz has a past medical history of Anemia, Chronic kidney disease, and Diabetes mellitus without complication ([place]).   She has a past surgical history that includes jejunostomy (05/17/2008); Gastric bypass (11/2001); and Vaginal hysterectomy (12/1986).   Her family history includes Cancer in her father and mother.She reports that she has never smoked. She has never used smokeless tobacco. She reports that she does not drink alcohol and does not use drugs.  Current Outpatient  Medications on File Prior to Visit  Medication Sig Dispense Refill   atorvastatin (LIPITOR) 40 MG tablet Take 1 tablet (40 mg total) by mouth daily. 90 tablet 3   Blood Glucose Monitoring Suppl (ONE TOUCH ULTRA SYSTEM KIT) W/DEVICE KIT 1 kit by Does not apply route once. 1 each 0   cyanocobalamin (,VITAMIN B-12,) 1000 MCG/ML injection Inject 1 mL (1,000 mcg total) into the skin every 30 (thirty) days. 10 mL prn   Cyanocobalamin 1000 MCG/ML KIT Give 1 ml from one vial and 0.2 ml from second vial monthly 4 kit 3   glucose blood (ONETOUCH ULTRA) test strip Test BS TID Dx E11.21 100 each 3   metFORMIN (GLUCOPHAGE-XR) 500 MG 24 hr tablet Take 1 tablet (500 mg total) by mouth 2 (two) times daily. (Patient taking differently: Take 500 mg by mouth daily with breakfast.) 180 tablet 3   Multiple Minerals-Vitamins (CALCIUM-MAGNESIUM-ZINC-D3 PO) Take by mouth 3 (three) times daily.     omeprazole (PRILOSEC) 40 MG capsule Take 1 capsule (40 mg total) by mouth daily. 90 capsule 3   Pediatric Multiple Vit-C-FA (FLINSTONES GUMMIES OMEGA-3 DHA) CHEW Chew by mouth.     predniSONE (DELTASONE) 1 MG tablet Take 4 tablets (4 mg total) by mouth daily. (Patient taking differently: Take 3 mg by mouth daily.) 360 tablet 3   raloxifene (EVISTA) 60 MG tablet Take 1 tablet (60 mg total) by mouth daily. 90 tablet 3   vitamin C (ASCORBIC ACID) 500 MG tablet Take 500 mg by mouth daily.     Current Facility-Administered Medications on File Prior to Visit  Medication Dose Route Frequency Provider Last Rate Last Admin   cyanocobalamin ((VITAMIN B-12)) injection 1,000 mcg  1,000 mcg Intramuscular Q30 days Claretta Fraise, MD   1,000 mcg at 05/12/21 1637    ROS Review of Systems  Constitutional: Negative.   HENT: Negative.    Eyes:  Negative for visual disturbance.  Respiratory:  Negative for shortness of breath.   Cardiovascular:  Negative for chest pain.  Gastrointestinal:  Negative for abdominal pain.  Musculoskeletal:   Negative for arthralgias.   Objective:  BP 113/70    Pulse 81    Temp 97.7 F (36.5 C)    Ht 5' 4"  (1.626 m)    Wt 185 lb (83.9 kg)    LMP 04/11/1986    SpO2 99%    BMI 31.76 kg/m   BP Readings from Last 3 Encounters:  05/12/21 113/70  02/04/21 124/72  11/04/20 116/71    Wt Readings from Last 3 Encounters:  05/12/21 185 lb (83.9 kg)  02/04/21 186 lb 3.2 oz (84.5 kg)  11/04/20 193 lb 3.2 oz (87.6 kg)     Physical Exam Constitutional:      General: She is not in acute distress.    Appearance: She is well-developed.  Cardiovascular:     Rate and Rhythm: Normal rate and regular rhythm.  Pulmonary:     Breath sounds: Normal breath sounds.  Musculoskeletal:        General: Normal range of motion.  Skin:    General: Skin is warm and dry.  Neurological:     Mental Status: She is alert and oriented to person, place, and time.      Assessment & Plan:   Erin Ortiz was seen today for medical management of chronic issues.  Diagnoses and all orders for this visit:  Type 2 diabetes mellitus with stage 3 chronic kidney disease, without long-term current use of insulin, unspecified whether stage 3a or 3b CKD (HCC) -     Bayer DCA Hb A1c Waived  Essential (primary) hypertension -     CBC with Differential/Platelet -     CMP14+EGFR  Acquired hypothyroidism -     TSH + free T4  Mixed hyperlipidemia -     Lipid panel  Iron deficiency anemia, unspecified iron deficiency anemia type -     Iron, TIBC and Ferritin Panel  Other orders -     levothyroxine (EUTHYROX) 125 MCG tablet; TAKE 1 TABLET BY MOUTH IN THE MORNING 30  MINUTES  BEFORE  MEAL -     lisinopril (ZESTRIL) 20 MG tablet; Take 1 tablet (20 mg total) by mouth 2 (two) times daily.      I am having Erin Ortiz maintain her ONE TOUCH ULTRA SYSTEM KIT, Multiple Minerals-Vitamins (CALCIUM-MAGNESIUM-ZINC-D3 PO), Flinstones Gummies Omega-3 DHA, vitamin C, Cyanocobalamin, cyanocobalamin, OneTouch Ultra, metFORMIN,  omeprazole, raloxifene, predniSONE, atorvastatin, levothyroxine, and lisinopril. We administered cyanocobalamin. We will continue to administer cyanocobalamin.  Meds ordered this encounter  Medications   levothyroxine (EUTHYROX) 125 MCG tablet    Sig: TAKE 1 TABLET BY MOUTH IN THE MORNING 30  MINUTES  BEFORE  MEAL    Dispense:  90 tablet    Refill:  3   lisinopril (ZESTRIL) 20 MG tablet    Sig: Take 1 tablet (20 mg total) by mouth 2 (two) times daily.    Dispense:  180 tablet    Refill:  3     Follow-up: Return in about 3 months (around 08/09/2021).  Claretta Fraise, M.D.

## 2021-05-17 NOTE — Progress Notes (Signed)
Hello Kasandra,  Your lab result is normal and/or stable.Some minor variations that are not significant are commonly marked abnormal, but do not represent any medical problem for you.  Best regards, Kenadee Gates, M.D.

## 2021-05-25 ENCOUNTER — Telehealth: Payer: Self-pay | Admitting: Family Medicine

## 2021-05-25 NOTE — Telephone Encounter (Signed)
Left message for patient to call back and schedule Medicare Annual Wellness Visit (AWV) to be completed by video or phone.   Last AWV: 11/11/2016  Please schedule at anytime with [facility] Nurse Health Advisor   Germaine Pomfret  45 minute appointment  Any questions, please contact me at [phone]

## 2021-06-09 ENCOUNTER — Telehealth: Payer: Self-pay | Admitting: Family Medicine

## 2021-06-09 ENCOUNTER — Other Ambulatory Visit: Payer: Self-pay | Admitting: Family Medicine

## 2021-06-09 ENCOUNTER — Ambulatory Visit (INDEPENDENT_AMBULATORY_CARE_PROVIDER_SITE_OTHER): Payer: Medicare PPO

## 2021-06-09 DIAGNOSIS — E538 Deficiency of other specified B group vitamins: Secondary | ICD-10-CM | POA: Diagnosis not present

## 2021-06-09 MED ORDER — ACCU-CHEK MULTICLIX LANCETS MISC: 3 refills | Status: AC

## 2021-06-09 MED ORDER — ACCU-CHEK AVIVA PLUS W/DEVICE KIT: PACK | 99 refills | Status: AC

## 2021-06-09 MED ORDER — ACCU-CHEK AVIVA PLUS VI STRP: ORAL_STRIP | 12 refills | Status: DC

## 2021-06-09 MED ORDER — ACCU-CHEK AVIVA PLUS VI STRP: ORAL_STRIP | 3 refills | Status: DC

## 2021-06-09 MED ORDER — ACCU-CHEK MULTICLIX LANCETS MISC: 12 refills | Status: DC

## 2021-06-09 MED ORDER — ACCU-CHEK AVIVA PLUS W/DEVICE KIT: 1.0000 | PACK | Freq: Two times a day (BID) | 99 refills | Status: DC

## 2021-06-09 NOTE — Progress Notes (Signed)
Cyanocobalamin injection given to right deltoid.  Patient tolerated well. 

## 2021-06-09 NOTE — Telephone Encounter (Signed)
Pharmacy needed directions and Dx on diabetic supplies that were sent in today ?Updated and resent ?

## 2021-06-14 ENCOUNTER — Telehealth: Payer: Self-pay | Admitting: Family Medicine

## 2021-06-14 NOTE — Telephone Encounter (Signed)
Spoke with pharmacist, they no longer needed us to call them back. ?

## 2021-06-17 ENCOUNTER — Telehealth: Payer: Self-pay | Admitting: Family Medicine

## 2021-06-17 NOTE — Telephone Encounter (Signed)
No answer unable to leave a message for patient to call back and schedule Medicare Annual Wellness Visit (AWV) to be completed by video or phone.  ? ?Last AWV: 11/11/2016 ? ?Please schedule at anytime with [facility] Nurse Health Advisor   Germaine Pomfret ? ?45 minute appointment ? ?Any questions, please contact me at [phone]  ?  ?  ?

## 2021-07-12 ENCOUNTER — Ambulatory Visit (INDEPENDENT_AMBULATORY_CARE_PROVIDER_SITE_OTHER): Payer: Medicare PPO | Admitting: *Deleted

## 2021-07-12 ENCOUNTER — Ambulatory Visit: Payer: Medicare PPO

## 2021-07-12 DIAGNOSIS — E538 Deficiency of other specified B group vitamins: Secondary | ICD-10-CM | POA: Diagnosis not present

## 2021-08-03 DIAGNOSIS — L821 Other seborrheic keratosis: Secondary | ICD-10-CM | POA: Diagnosis not present

## 2021-08-03 DIAGNOSIS — L570 Actinic keratosis: Secondary | ICD-10-CM | POA: Diagnosis not present

## 2021-08-03 DIAGNOSIS — Z85828 Personal history of other malignant neoplasm of skin: Secondary | ICD-10-CM | POA: Diagnosis not present

## 2021-08-03 DIAGNOSIS — L814 Other melanin hyperpigmentation: Secondary | ICD-10-CM | POA: Diagnosis not present

## 2021-08-03 DIAGNOSIS — L579 Skin changes due to chronic exposure to nonionizing radiation, unspecified: Secondary | ICD-10-CM | POA: Diagnosis not present

## 2021-08-09 ENCOUNTER — Encounter: Payer: Self-pay | Admitting: Family Medicine

## 2021-08-09 ENCOUNTER — Ambulatory Visit: Payer: Medicare PPO | Admitting: Family Medicine

## 2021-08-09 ENCOUNTER — Ambulatory Visit (INDEPENDENT_AMBULATORY_CARE_PROVIDER_SITE_OTHER): Payer: Medicare PPO

## 2021-08-09 ENCOUNTER — Other Ambulatory Visit: Payer: Self-pay

## 2021-08-09 VITALS — BP 125/72 | HR 85 | Temp 97.9°F | Ht 64.0 in | Wt 188.6 lb

## 2021-08-09 DIAGNOSIS — M85851 Other specified disorders of bone density and structure, right thigh: Secondary | ICD-10-CM | POA: Diagnosis not present

## 2021-08-09 DIAGNOSIS — N183 Chronic kidney disease, stage 3 unspecified: Secondary | ICD-10-CM | POA: Diagnosis not present

## 2021-08-09 DIAGNOSIS — I10 Essential (primary) hypertension: Secondary | ICD-10-CM | POA: Diagnosis not present

## 2021-08-09 DIAGNOSIS — E1122 Type 2 diabetes mellitus with diabetic chronic kidney disease: Secondary | ICD-10-CM

## 2021-08-09 DIAGNOSIS — E782 Mixed hyperlipidemia: Secondary | ICD-10-CM | POA: Diagnosis not present

## 2021-08-09 DIAGNOSIS — M85832 Other specified disorders of bone density and structure, left forearm: Secondary | ICD-10-CM | POA: Diagnosis not present

## 2021-08-09 DIAGNOSIS — E039 Hypothyroidism, unspecified: Secondary | ICD-10-CM | POA: Diagnosis not present

## 2021-08-09 DIAGNOSIS — E538 Deficiency of other specified B group vitamins: Secondary | ICD-10-CM | POA: Diagnosis not present

## 2021-08-09 DIAGNOSIS — Z780 Asymptomatic menopausal state: Secondary | ICD-10-CM

## 2021-08-09 DIAGNOSIS — D509 Iron deficiency anemia, unspecified: Secondary | ICD-10-CM | POA: Diagnosis not present

## 2021-08-09 LAB — BAYER DCA HB A1C WAIVED: HB A1C (BAYER DCA - WAIVED): 6.2 % — ABNORMAL HIGH (ref 4.8–5.6)

## 2021-08-09 MED ORDER — METFORMIN HCL ER 500 MG PO TB24: 500.0000 mg | ORAL_TABLET | Freq: Every day | ORAL | 3 refills | Status: DC

## 2021-08-09 MED ORDER — OMEPRAZOLE 40 MG PO CPDR: 40.0000 mg | DELAYED_RELEASE_CAPSULE | Freq: Every day | ORAL | 2 refills | Status: DC

## 2021-08-09 MED ORDER — PREDNISONE 1 MG PO TABS: 4.0000 mg | ORAL_TABLET | Freq: Every day | ORAL | 2 refills | Status: DC

## 2021-08-09 MED ORDER — METFORMIN HCL ER 500 MG PO TB24: 500.0000 mg | ORAL_TABLET | Freq: Two times a day (BID) | ORAL | 2 refills | Status: DC

## 2021-08-09 MED ORDER — RALOXIFENE HCL 60 MG PO TABS: 60.0000 mg | ORAL_TABLET | Freq: Every day | ORAL | 2 refills | Status: DC

## 2021-08-09 NOTE — Progress Notes (Signed)
? ?Subjective:  ?[name], female    DOB: [birth year]  Age: 76 y.o. MRN: [id number] ? ?CC: Medical Management of Chronic Issues ? ? ?HPI ?Erin Ortiz presents forFollow-up of diabetes. Patient checks blood sugar at home.  ? 71-102 fasting and a little more postprandial ?Patient denies symptoms such as polyuria, polydipsia, excessive hunger, nausea ?Occasional mild hypoglycemic spells noted. ?Medications reviewed. Pt reports taking them regularly without complication/adverse reaction being reported today.  ?Checking feet daily ?Next eye appt in June. ? ?History ?Erin Ortiz has a past medical history of Anemia, Chronic kidney disease, and Diabetes mellitus without complication ([place]).  ? ?She has a past surgical history that includes jejunostomy (05/17/2008); Gastric bypass (11/2001); and Vaginal hysterectomy (12/1986).  ? ?Her family history includes Cancer in her father and mother.She reports that she has never smoked. She has never used smokeless tobacco. She reports that she does not drink alcohol and does not use drugs. ? ?Current Outpatient Medications on File Prior to Visit  ?Medication Sig Dispense Refill  ? atorvastatin (LIPITOR) 40 MG tablet Take 1 tablet (40 mg total) by mouth daily. 90 tablet 3  ? Blood Glucose Monitoring Suppl (ACCU-CHEK AVIVA PLUS) w/Device KIT Test BS BID Dx E11.21 1 kit PRN  ? cyanocobalamin (,VITAMIN B-12,) 1000 MCG/ML injection Inject 1 mL (1,000 mcg total) into the skin every 30 (thirty) days. 10 mL prn  ? Cyanocobalamin 1000 MCG/ML KIT Give 1 ml from one vial and 0.2 ml from second vial monthly 4 kit 3  ? glucose blood (ACCU-CHEK AVIVA PLUS) test strip Test BS BID Dx E11.21 200 each 3  ? Lancets (ACCU-CHEK MULTICLIX) lancets Test BS BID Dx E11.21 200 each 3  ? levothyroxine (EUTHYROX) 125 MCG tablet TAKE 1 TABLET BY MOUTH IN THE MORNING 30  MINUTES  BEFORE  MEAL 90 tablet 3  ? lisinopril (ZESTRIL) 20 MG tablet Take 1 tablet (20 mg total) by mouth 2 (two) times daily. 180  tablet 3  ? Multiple Minerals-Vitamins (CALCIUM-MAGNESIUM-ZINC-D3 PO) Take by mouth 3 (three) times daily.    ? Pediatric Multiple Vit-C-FA (FLINSTONES GUMMIES OMEGA-3 DHA) CHEW Chew by mouth.    ? vitamin C (ASCORBIC ACID) 500 MG tablet Take 500 mg by mouth daily.    ? ?Current Facility-Administered Medications on File Prior to Visit  ?Medication Dose Route Frequency Provider Last Rate Last Admin  ? cyanocobalamin ((VITAMIN B-12)) injection 1,000 mcg  1,000 mcg Intramuscular Q30 days Claretta Fraise, MD   1,000 mcg at 07/12/21 1715  ? ? ?ROS ?Review of Systems  ?Constitutional: Negative.   ?HENT: Negative.    ?Eyes:  Negative for visual disturbance.  ?Respiratory:  Negative for shortness of breath.   ?Cardiovascular:  Negative for chest pain.  ?Gastrointestinal:  Negative for abdominal pain.  ?Musculoskeletal:  Negative for arthralgias.  ? ?Objective:  ?BP 125/72   Pulse 85   Temp 97.9 ?F (36.6 ?C)   Ht 5' 4"  (1.626 m)   Wt 188 lb 9.6 oz (85.5 kg)   LMP 04/11/1986   SpO2 98%   BMI 32.37 kg/m?  ? ?BP Readings from Last 3 Encounters:  ?08/09/21 125/72  ?05/12/21 113/70  ?02/04/21 124/72  ? ? ?Wt Readings from Last 3 Encounters:  ?08/09/21 188 lb 9.6 oz (85.5 kg)  ?05/12/21 185 lb (83.9 kg)  ?02/04/21 186 lb 3.2 oz (84.5 kg)  ? ? ? ?Physical Exam ?Constitutional:   ?   General: She is not in acute distress. ?   Appearance: She  is well-developed.  ?Cardiovascular:  ?   Rate and Rhythm: Normal rate and regular rhythm.  ?Pulmonary:  ?   Breath sounds: Normal breath sounds.  ?Musculoskeletal:     ?   General: Normal range of motion.  ?Skin: ?   General: Skin is warm and dry.  ?Neurological:  ?   Mental Status: She is alert and oriented to person, place, and time.  ? ? ? ? ?Assessment & Plan:  ? ?Erin Ortiz was seen today for medical management of chronic issues. ? ?Diagnoses and all orders for this visit: ? ?Type 2 diabetes mellitus with stage 3 chronic kidney disease, without long-term current use of insulin,  unspecified whether stage 3a or 3b CKD ([place]) ?-     Bayer DCA Hb A1c Waived ?-     Discontinue: metFORMIN (GLUCOPHAGE-XR) 500 MG 24 hr tablet; Take 1 tablet (500 mg total) by mouth 2 (two) times daily. ?-     metFORMIN (GLUCOPHAGE-XR) 500 MG 24 hr tablet; Take 1 tablet (500 mg total) by mouth daily with breakfast. ? ?Essential (primary) hypertension ?-     CBC with Differential/Platelet ?-     CMP14+EGFR ? ?Acquired hypothyroidism ?-     TSH + free T4 ? ?Mixed hyperlipidemia ?-     Lipid panel ? ?Iron deficiency anemia, unspecified iron deficiency anemia type ?-     Iron, TIBC and Ferritin Panel ? ?Other orders ?-     omeprazole (PRILOSEC) 40 MG capsule; Take 1 capsule (40 mg total) by mouth daily. ?-     predniSONE (DELTASONE) 1 MG tablet; Take 4 tablets (4 mg total) by mouth daily. ?-     raloxifene (EVISTA) 60 MG tablet; Take 1 tablet (60 mg total) by mouth daily. ? ? ? ? ? ?I have discontinued Erin Ortiz's metFORMIN. I have also changed her metFORMIN. Additionally, I am having her maintain her Multiple Minerals-Vitamins (CALCIUM-MAGNESIUM-ZINC-D3 PO), Flinstones Gummies Omega-3 DHA, vitamin C, Cyanocobalamin, cyanocobalamin, atorvastatin, levothyroxine, lisinopril, Accu-Chek Aviva Plus, Accu-Chek Aviva Plus, accu-chek multiclix, omeprazole, predniSONE, and raloxifene. We will continue to administer cyanocobalamin. ? ?Meds ordered this encounter  ?Medications  ? DISCONTD: metFORMIN (GLUCOPHAGE-XR) 500 MG 24 hr tablet  ?  Sig: Take 1 tablet (500 mg total) by mouth 2 (two) times daily.  ?  Dispense:  180 tablet  ?  Refill:  2  ? omeprazole (PRILOSEC) 40 MG capsule  ?  Sig: Take 1 capsule (40 mg total) by mouth daily.  ?  Dispense:  90 capsule  ?  Refill:  2  ? predniSONE (DELTASONE) 1 MG tablet  ?  Sig: Take 4 tablets (4 mg total) by mouth daily.  ?  Dispense:  360 tablet  ?  Refill:  2  ? raloxifene (EVISTA) 60 MG tablet  ?  Sig: Take 1 tablet (60 mg total) by mouth daily.  ?  Dispense:  90 tablet  ?  Refill:   2  ? metFORMIN (GLUCOPHAGE-XR) 500 MG 24 hr tablet  ?  Sig: Take 1 tablet (500 mg total) by mouth daily with breakfast.  ?  Dispense:  90 tablet  ?  Refill:  3  ? ? ? ?Follow-up: Return in about 3 months (around 11/09/2021). ? ?Claretta Fraise, M.D. ?

## 2021-08-10 LAB — CBC WITH DIFFERENTIAL/PLATELET
Basophils Absolute: 0.1 10*3/uL (ref 0.0–0.2)
Basos: 1 %
EOS (ABSOLUTE): 0.1 10*3/uL (ref 0.0–0.4)
Eos: 1 %
Hematocrit: 31.2 % — ABNORMAL LOW (ref 34.0–46.6)
Hemoglobin: 10.5 g/dL — ABNORMAL LOW (ref 11.1–15.9)
Immature Grans (Abs): 0 10*3/uL (ref 0.0–0.1)
Immature Granulocytes: 0 %
Lymphocytes Absolute: 0.8 10*3/uL (ref 0.7–3.1)
Lymphs: 12 %
MCH: 29.6 pg (ref 26.6–33.0)
MCHC: 33.7 g/dL (ref 31.5–35.7)
MCV: 88 fL (ref 79–97)
Monocytes Absolute: 0.3 10*3/uL (ref 0.1–0.9)
Monocytes: 5 %
Neutrophils Absolute: 5.2 10*3/uL (ref 1.4–7.0)
Neutrophils: 81 %
Platelets: 192 10*3/uL (ref 150–450)
RBC: 3.55 x10E6/uL — ABNORMAL LOW (ref 3.77–5.28)
RDW: 13.5 % (ref 11.7–15.4)
WBC: 6.4 10*3/uL (ref 3.4–10.8)

## 2021-08-10 LAB — IRON,TIBC AND FERRITIN PANEL
Ferritin: 274 ng/mL — ABNORMAL HIGH (ref 15–150)
Iron Saturation: 23 % (ref 15–55)
Iron: 58 ug/dL (ref 27–139)
Total Iron Binding Capacity: 252 ug/dL (ref 250–450)
UIBC: 194 ug/dL (ref 118–369)

## 2021-08-10 LAB — LIPID PANEL
Chol/HDL Ratio: 2.6 ratio (ref 0.0–4.4)
Cholesterol, Total: 160 mg/dL (ref 100–199)
HDL: 62 mg/dL (ref >39)
LDL Chol Calc (NIH): 85 mg/dL (ref 0–99)
Triglycerides: 69 mg/dL (ref 0–149)
VLDL Cholesterol Cal: 13 mg/dL (ref 5–40)

## 2021-08-10 LAB — CMP14+EGFR
ALT: 12 IU/L (ref 0–32)
AST: 13 IU/L (ref 0–40)
Albumin/Globulin Ratio: 1.8 (ref 1.2–2.2)
Albumin: 4 g/dL (ref 3.7–4.7)
Alkaline Phosphatase: 106 IU/L (ref 44–121)
BUN/Creatinine Ratio: 16 (ref 12–28)
BUN: 21 mg/dL (ref 8–27)
Bilirubin Total: 0.3 mg/dL (ref 0.0–1.2)
CO2: 23 mmol/L (ref 20–29)
Calcium: 8.6 mg/dL — ABNORMAL LOW (ref 8.7–10.3)
Chloride: 109 mmol/L — ABNORMAL HIGH (ref 96–106)
Creatinine, Ser: 1.28 mg/dL — ABNORMAL HIGH (ref 0.57–1.00)
Globulin, Total: 2.2 g/dL (ref 1.5–4.5)
Glucose: 127 mg/dL — ABNORMAL HIGH (ref 70–99)
Potassium: 4.9 mmol/L (ref 3.5–5.2)
Sodium: 141 mmol/L (ref 134–144)
Total Protein: 6.2 g/dL (ref 6.0–8.5)
eGFR: 43 mL/min/{1.73_m2} — ABNORMAL LOW (ref >59)

## 2021-08-10 LAB — TSH+FREE T4
Free T4: 1.25 ng/dL (ref 0.82–1.77)
TSH: 0.447 u[IU]/mL — ABNORMAL LOW (ref 0.450–4.500)

## 2021-08-12 DIAGNOSIS — D692 Other nonthrombocytopenic purpura: Secondary | ICD-10-CM | POA: Diagnosis not present

## 2021-08-12 DIAGNOSIS — L303 Infective dermatitis: Secondary | ICD-10-CM | POA: Diagnosis not present

## 2021-08-12 DIAGNOSIS — L237 Allergic contact dermatitis due to plants, except food: Secondary | ICD-10-CM | POA: Diagnosis not present

## 2021-09-13 ENCOUNTER — Ambulatory Visit (INDEPENDENT_AMBULATORY_CARE_PROVIDER_SITE_OTHER): Payer: Medicare PPO | Admitting: Emergency Medicine

## 2021-09-13 DIAGNOSIS — E538 Deficiency of other specified B group vitamins: Secondary | ICD-10-CM

## 2021-09-13 NOTE — Progress Notes (Signed)
Patient presents for B12 injection. Injection given in Right Deltoid. Patient tolerated well.   Kathi Simpers, RN

## 2021-09-14 DIAGNOSIS — H43813 Vitreous degeneration, bilateral: Secondary | ICD-10-CM | POA: Diagnosis not present

## 2021-09-14 DIAGNOSIS — H33313 Horseshoe tear of retina without detachment, bilateral: Secondary | ICD-10-CM | POA: Diagnosis not present

## 2021-09-14 DIAGNOSIS — H18893 Other specified disorders of cornea, bilateral: Secondary | ICD-10-CM | POA: Diagnosis not present

## 2021-09-14 DIAGNOSIS — H2513 Age-related nuclear cataract, bilateral: Secondary | ICD-10-CM | POA: Diagnosis not present

## 2021-09-14 DIAGNOSIS — H527 Unspecified disorder of refraction: Secondary | ICD-10-CM | POA: Diagnosis not present

## 2021-09-14 DIAGNOSIS — E119 Type 2 diabetes mellitus without complications: Secondary | ICD-10-CM | POA: Diagnosis not present

## 2021-09-14 LAB — HM DIABETES EYE EXAM

## 2021-10-13 ENCOUNTER — Ambulatory Visit (INDEPENDENT_AMBULATORY_CARE_PROVIDER_SITE_OTHER): Payer: Medicare PPO

## 2021-10-13 ENCOUNTER — Other Ambulatory Visit: Payer: Self-pay | Admitting: Family Medicine

## 2021-10-13 ENCOUNTER — Inpatient Hospital Stay
Admission: RE | Admit: 2021-10-13 | Discharge: 2021-10-13 | Disposition: A | Discharge status: Home / Self Care | Payer: Self-pay | Source: Ambulatory Visit | Attending: Family Medicine | Admitting: Family Medicine

## 2021-10-13 DIAGNOSIS — Z139 Encounter for screening, unspecified: Secondary | ICD-10-CM

## 2021-10-13 DIAGNOSIS — E538 Deficiency of other specified B group vitamins: Secondary | ICD-10-CM | POA: Diagnosis not present

## 2021-10-13 DIAGNOSIS — Z1231 Encounter for screening mammogram for malignant neoplasm of breast: Secondary | ICD-10-CM | POA: Diagnosis not present

## 2021-10-13 NOTE — Progress Notes (Signed)
Cyanocobalamin injection given to left deltoid.  Patient tolerated well. 

## 2021-10-21 DIAGNOSIS — H1131 Conjunctival hemorrhage, right eye: Secondary | ICD-10-CM | POA: Diagnosis not present

## 2021-10-21 DIAGNOSIS — H18593 Other hereditary corneal dystrophies, bilateral: Secondary | ICD-10-CM | POA: Diagnosis not present

## 2021-11-09 ENCOUNTER — Encounter: Payer: Self-pay | Admitting: Family Medicine

## 2021-11-09 ENCOUNTER — Ambulatory Visit: Payer: Medicare PPO | Admitting: Family Medicine

## 2021-11-09 VITALS — BP 123/73 | HR 85 | Temp 97.7°F | Ht 64.0 in | Wt 187.8 lb

## 2021-11-09 DIAGNOSIS — D509 Iron deficiency anemia, unspecified: Secondary | ICD-10-CM | POA: Diagnosis not present

## 2021-11-09 DIAGNOSIS — E782 Mixed hyperlipidemia: Secondary | ICD-10-CM | POA: Diagnosis not present

## 2021-11-09 DIAGNOSIS — I10 Essential (primary) hypertension: Secondary | ICD-10-CM

## 2021-11-09 DIAGNOSIS — E538 Deficiency of other specified B group vitamins: Secondary | ICD-10-CM

## 2021-11-09 DIAGNOSIS — E11649 Type 2 diabetes mellitus with hypoglycemia without coma: Secondary | ICD-10-CM | POA: Diagnosis not present

## 2021-11-09 DIAGNOSIS — N183 Chronic kidney disease, stage 3 unspecified: Secondary | ICD-10-CM

## 2021-11-09 DIAGNOSIS — E039 Hypothyroidism, unspecified: Secondary | ICD-10-CM

## 2021-11-09 DIAGNOSIS — E1122 Type 2 diabetes mellitus with diabetic chronic kidney disease: Secondary | ICD-10-CM | POA: Diagnosis not present

## 2021-11-09 LAB — BAYER DCA HB A1C WAIVED: HB A1C (BAYER DCA - WAIVED): 6.1 % — ABNORMAL HIGH (ref 4.8–5.6)

## 2021-11-09 NOTE — Progress Notes (Signed)
Subjective:  [name], female    DOB: February 09, 1946  Age: 76 y.o. MRN: [id number]  CC: Medical Management of Chronic Issues   HPI Erin Ortiz presents forFollow-up of diabetes. Patient checks blood sugar at home.   90-115 fasting and 40-125 postprandial Patient denies symptoms such as polyuria, polydipsia, excessive hunger, nausea Several significant hypoglycemic spells noted.After meal readings. As low as the 30s. Causes her to feel terrible.  Medications reviewed. Pt reports taking them regularly without complication/adverse reaction being reported today.    History Erin Ortiz has a past medical history of Anemia, Chronic kidney disease, and Diabetes mellitus without complication (Erin Ortiz).   She has a past surgical history that includes jejunostomy (05/17/2008); Gastric bypass (11/2001); and Vaginal hysterectomy (12/1986).   Her family history includes Breast cancer in her maternal aunt and mother; Cancer in her father and mother.She reports that she has never smoked. She has never used smokeless tobacco. She reports that she does not drink alcohol and does not use drugs.  Current Outpatient Medications on File Prior to Visit  Medication Sig Dispense Refill   atorvastatin (LIPITOR) 40 MG tablet Take 1 tablet (40 mg total) by mouth daily. 90 tablet 3   Blood Glucose Monitoring Suppl (ACCU-CHEK AVIVA PLUS) w/Device KIT Test BS BID Dx E11.21 1 kit PRN   cyanocobalamin (,VITAMIN B-12,) 1000 MCG/ML injection Inject 1 mL (1,000 mcg total) into the skin every 30 (thirty) days. 10 mL prn   Cyanocobalamin 1000 MCG/ML KIT Give 1 ml from one vial and 0.2 ml from second vial monthly 4 kit 3   glucose blood (ACCU-CHEK AVIVA PLUS) test strip Test BS BID Dx E11.21 200 each 3   Lancets (ACCU-CHEK MULTICLIX) lancets Test BS BID Dx E11.21 200 each 3   levothyroxine (EUTHYROX) 125 MCG tablet TAKE 1 TABLET BY MOUTH IN THE MORNING 30  MINUTES  BEFORE  MEAL 90 tablet 3   lisinopril (ZESTRIL) 20 MG  tablet Take 1 tablet (20 mg total) by mouth 2 (two) times daily. 180 tablet 3   Multiple Minerals-Vitamins (CALCIUM-MAGNESIUM-ZINC-D3 PO) Take by mouth 3 (three) times daily.     omeprazole (PRILOSEC) 40 MG capsule Take 1 capsule (40 mg total) by mouth daily. 90 capsule 2   Pediatric Multiple Vit-C-FA (FLINSTONES GUMMIES OMEGA-3 DHA) CHEW Chew by mouth.     predniSONE (DELTASONE) 1 MG tablet Take 4 tablets (4 mg total) by mouth daily. 360 tablet 2   raloxifene (EVISTA) 60 MG tablet Take 1 tablet (60 mg total) by mouth daily. 90 tablet 2   vitamin C (ASCORBIC ACID) 500 MG tablet Take 500 mg by mouth daily.     Current Facility-Administered Medications on File Prior to Visit  Medication Dose Route Frequency Provider Last Rate Last Admin   cyanocobalamin ((VITAMIN B-12)) injection 1,000 mcg  1,000 mcg Intramuscular Q30 days Claretta Fraise, MD   1,000 mcg at 11/09/21 1544    ROS Review of Systems  Constitutional: Negative.   HENT: Negative.    Eyes:  Negative for visual disturbance.  Respiratory:  Negative for shortness of breath.   Cardiovascular:  Negative for chest pain.  Gastrointestinal:  Negative for abdominal pain.  Musculoskeletal:  Negative for arthralgias.    Objective:  BP 123/73   Pulse 85   Temp 97.7 F (36.5 C)   Ht _0  (1.626 m)   Wt 187 lb 12.8 oz (85.2 kg)   LMP 04/11/1986   SpO2 97%   BMI 32.24 kg/m  BP Readings from Last 3 Encounters:  11/09/21 123/73  08/09/21 125/72  05/12/21 113/70    Wt Readings from Last 3 Encounters:  11/09/21 187 lb 12.8 oz (85.2 kg)  08/09/21 188 lb 9.6 oz (85.5 kg)  05/12/21 185 lb (83.9 kg)     Physical Exam Constitutional:      General: She is not in acute distress.    Appearance: She is well-developed.  Cardiovascular:     Rate and Rhythm: Normal rate and regular rhythm.  Pulmonary:     Breath sounds: Normal breath sounds.  Musculoskeletal:        General: Normal range of motion.  Skin:    General: Skin is warm  and dry.  Neurological:     Mental Status: She is alert and oriented to person, place, and time.       Assessment & Plan:   Erin Ortiz was seen today for medical management of chronic issues.  Diagnoses and all orders for this visit:  Type 2 diabetes mellitus with stage 3 chronic kidney disease, without long-term current use of insulin, unspecified whether stage 3a or 3b CKD ([place]) -     Bayer DCA Hb A1c Waived  Acquired hypothyroidism -     TSH + free T4  Essential (primary) hypertension -     CBC with Differential/Platelet -     CMP14+EGFR  Mixed hyperlipidemia -     Lipid panel  Iron deficiency anemia, unspecified iron deficiency anemia type -     Iron, TIBC and Ferritin Panel  Severe diabetic hypoglycemia ([place])      I have discontinued Erin Ortiz's metFORMIN. I am also having her maintain her Multiple Minerals-Vitamins (CALCIUM-MAGNESIUM-ZINC-D3 PO), Flinstones Gummies Omega-3 DHA, ascorbic acid, Cyanocobalamin, cyanocobalamin, atorvastatin, levothyroxine, lisinopril, Accu-Chek Aviva Plus, Accu-Chek Aviva Plus, accu-chek multiclix, omeprazole, predniSONE, and raloxifene. We administered cyanocobalamin. We will continue to administer cyanocobalamin.  No orders of the defined types were placed in this encounter.    Follow-up: Return in about 3 months (around 02/09/2022).  Claretta Fraise, M.D.

## 2021-11-10 LAB — CBC WITH DIFFERENTIAL/PLATELET
Basophils Absolute: 0.1 10*3/uL (ref 0.0–0.2)
Basos: 1 %
EOS (ABSOLUTE): 0.1 10*3/uL (ref 0.0–0.4)
Eos: 1 %
Hematocrit: 30.5 % — ABNORMAL LOW (ref 34.0–46.6)
Hemoglobin: 10 g/dL — ABNORMAL LOW (ref 11.1–15.9)
Immature Grans (Abs): 0 10*3/uL (ref 0.0–0.1)
Immature Granulocytes: 0 %
Lymphocytes Absolute: 1 10*3/uL (ref 0.7–3.1)
Lymphs: 16 %
MCH: 28.6 pg (ref 26.6–33.0)
MCHC: 32.8 g/dL (ref 31.5–35.7)
MCV: 87 fL (ref 79–97)
Monocytes Absolute: 0.4 10*3/uL (ref 0.1–0.9)
Monocytes: 6 %
Neutrophils Absolute: 4.6 10*3/uL (ref 1.4–7.0)
Neutrophils: 76 %
Platelets: 200 10*3/uL (ref 150–450)
RBC: 3.5 x10E6/uL — ABNORMAL LOW (ref 3.77–5.28)
RDW: 13.8 % (ref 11.7–15.4)
WBC: 6.1 10*3/uL (ref 3.4–10.8)

## 2021-11-10 LAB — CMP14+EGFR
ALT: 12 IU/L (ref 0–32)
AST: 15 IU/L (ref 0–40)
Albumin/Globulin Ratio: 1.5 (ref 1.2–2.2)
Albumin: 3.8 g/dL (ref 3.8–4.8)
Alkaline Phosphatase: 86 IU/L (ref 44–121)
BUN/Creatinine Ratio: 12 (ref 12–28)
BUN: 17 mg/dL (ref 8–27)
Bilirubin Total: 0.4 mg/dL (ref 0.0–1.2)
CO2: 20 mmol/L (ref 20–29)
Calcium: 8.9 mg/dL (ref 8.7–10.3)
Chloride: 106 mmol/L (ref 96–106)
Creatinine, Ser: 1.41 mg/dL — ABNORMAL HIGH (ref 0.57–1.00)
Globulin, Total: 2.5 g/dL (ref 1.5–4.5)
Glucose: 114 mg/dL — ABNORMAL HIGH (ref 70–99)
Potassium: 5.5 mmol/L — ABNORMAL HIGH (ref 3.5–5.2)
Sodium: 137 mmol/L (ref 134–144)
Total Protein: 6.3 g/dL (ref 6.0–8.5)
eGFR: 39 mL/min/{1.73_m2} — ABNORMAL LOW (ref >59)

## 2021-11-10 LAB — TSH+FREE T4
Free T4: 1.62 ng/dL (ref 0.82–1.77)
TSH: 0.083 u[IU]/mL — ABNORMAL LOW (ref 0.450–4.500)

## 2021-11-10 LAB — LIPID PANEL
Chol/HDL Ratio: 2.7 ratio (ref 0.0–4.4)
Cholesterol, Total: 152 mg/dL (ref 100–199)
HDL: 56 mg/dL (ref >39)
LDL Chol Calc (NIH): 80 mg/dL (ref 0–99)
Triglycerides: 82 mg/dL (ref 0–149)
VLDL Cholesterol Cal: 16 mg/dL (ref 5–40)

## 2021-11-10 LAB — IRON,TIBC AND FERRITIN PANEL
Ferritin: 331 ng/mL — ABNORMAL HIGH (ref 15–150)
Iron Saturation: 21 % (ref 15–55)
Iron: 56 ug/dL (ref 27–139)
Total Iron Binding Capacity: 261 ug/dL (ref 250–450)
UIBC: 205 ug/dL (ref 118–369)

## 2021-12-14 ENCOUNTER — Ambulatory Visit: Payer: Medicare PPO

## 2021-12-15 ENCOUNTER — Ambulatory Visit (INDEPENDENT_AMBULATORY_CARE_PROVIDER_SITE_OTHER): Payer: Medicare PPO | Admitting: *Deleted

## 2021-12-15 DIAGNOSIS — E538 Deficiency of other specified B group vitamins: Secondary | ICD-10-CM | POA: Diagnosis not present

## 2022-01-14 ENCOUNTER — Ambulatory Visit (INDEPENDENT_AMBULATORY_CARE_PROVIDER_SITE_OTHER): Payer: Medicare PPO

## 2022-01-14 DIAGNOSIS — E538 Deficiency of other specified B group vitamins: Secondary | ICD-10-CM | POA: Diagnosis not present

## 2022-01-14 DIAGNOSIS — Z23 Encounter for immunization: Secondary | ICD-10-CM

## 2022-02-01 DIAGNOSIS — L570 Actinic keratosis: Secondary | ICD-10-CM | POA: Diagnosis not present

## 2022-02-01 DIAGNOSIS — L601 Onycholysis: Secondary | ICD-10-CM | POA: Diagnosis not present

## 2022-02-05 ENCOUNTER — Other Ambulatory Visit: Payer: Self-pay | Admitting: Family Medicine

## 2022-02-05 DIAGNOSIS — N183 Chronic kidney disease, stage 3 unspecified: Secondary | ICD-10-CM

## 2022-02-09 ENCOUNTER — Ambulatory Visit: Payer: Medicare PPO | Admitting: Family Medicine

## 2022-02-09 ENCOUNTER — Encounter: Payer: Self-pay | Admitting: Family Medicine

## 2022-02-09 VITALS — BP 116/74 | HR 91 | Temp 97.8°F | Ht 64.0 in | Wt 188.8 lb

## 2022-02-09 DIAGNOSIS — E039 Hypothyroidism, unspecified: Secondary | ICD-10-CM

## 2022-02-09 DIAGNOSIS — D509 Iron deficiency anemia, unspecified: Secondary | ICD-10-CM | POA: Diagnosis not present

## 2022-02-09 DIAGNOSIS — N183 Chronic kidney disease, stage 3 unspecified: Secondary | ICD-10-CM

## 2022-02-09 DIAGNOSIS — E782 Mixed hyperlipidemia: Secondary | ICD-10-CM | POA: Diagnosis not present

## 2022-02-09 DIAGNOSIS — E538 Deficiency of other specified B group vitamins: Secondary | ICD-10-CM

## 2022-02-09 DIAGNOSIS — E1122 Type 2 diabetes mellitus with diabetic chronic kidney disease: Secondary | ICD-10-CM

## 2022-02-09 DIAGNOSIS — M353 Polymyalgia rheumatica: Secondary | ICD-10-CM

## 2022-02-09 MED ORDER — ATORVASTATIN CALCIUM 40 MG PO TABS: 40.0000 mg | ORAL_TABLET | Freq: Every day | ORAL | 3 refills | Status: AC

## 2022-02-09 NOTE — Progress Notes (Signed)
Subjective:  [name],  female    DOB: September 09, 1945  Age: 76 y.o.    CC: Medical Management of Chronic Issues   HPI Erin Ortiz presents for  follow-up of hypertension. Patient has no history of headache chest pain or shortness of breath or recent cough. Patient also denies symptoms of TIA such as numbness weakness lateralizing. Patient denies side effects from medication. States taking it regularly.  Patient also  in for follow-up of elevated cholesterol. Doing well without complaints on current medication. Denies side effects  including myalgia and arthralgia and nausea. Also in today for liver function testing. Currently no chest pain, shortness of breath or other cardiovascular related symptoms noted.  Follow-up of diabetes. Patient does check blood sugar at home. Readings run between 100 and 150 Patient denies symptoms such as excessive hunger or urinary frequency, excessive hunger, nausea No significant hypoglycemic spells noted. Currently off all meds for DM, attempting diet control of glucose for the last three months.   follow-up on  thyroid. The patient has a history of hypothyroidism for many years. It has been stable recently. Pt. denies any change in  voice, loss of hair, heat or cold intolerance. Energy level has been adequate to good. Patient denies constipation and diarrhea. No myxedema. Medication is as noted below. Verified that pt is taking it daily on an empty stomach. Well tolerated.    History Erin Ortiz has a past medical history of Anemia, Chronic kidney disease, and Diabetes mellitus without complication ([place]).   She has a past surgical history that includes jejunostomy (05/17/2008); Gastric bypass (11/2001); and Vaginal hysterectomy (12/1986).   Her family history includes Breast cancer in her maternal aunt and mother; Cancer in her father and mother.She reports that she has never smoked. She has never used smokeless tobacco. She reports that she does  not drink alcohol and does not use drugs.  Current Outpatient Medications on File Prior to Visit  Medication Sig Dispense Refill   Blood Glucose Monitoring Suppl (ACCU-CHEK AVIVA PLUS) w/Device KIT Test BS BID Dx E11.21 1 kit PRN   cyanocobalamin (,VITAMIN B-12,) 1000 MCG/ML injection Inject 1 mL (1,000 mcg total) into the skin every 30 (thirty) days. 10 mL prn   Cyanocobalamin 1000 MCG/ML KIT Give 1 ml from one vial and 0.2 ml from second vial monthly 4 kit 3   glucose blood (ACCU-CHEK AVIVA PLUS) test strip Test BS BID Dx E11.21 200 each 3   Lancets (ACCU-CHEK MULTICLIX) lancets Test BS BID Dx E11.21 200 each 3   levothyroxine (EUTHYROX) 125 MCG tablet TAKE 1 TABLET BY MOUTH IN THE MORNING 30  MINUTES  BEFORE  MEAL 90 tablet 3   lisinopril (ZESTRIL) 20 MG tablet Take 1 tablet (20 mg total) by mouth 2 (two) times daily. 180 tablet 3   Multiple Minerals-Vitamins (CALCIUM-MAGNESIUM-ZINC-D3 PO) Take by mouth 3 (three) times daily.     omeprazole (PRILOSEC) 40 MG capsule Take 1 capsule (40 mg total) by mouth daily. 90 capsule 2   Pediatric Multiple Vit-C-FA (FLINSTONES GUMMIES OMEGA-3 DHA) CHEW Chew by mouth.     predniSONE (DELTASONE) 1 MG tablet Take 4 tablets (4 mg total) by mouth daily. 360 tablet 2   raloxifene (EVISTA) 60 MG tablet Take 1 tablet (60 mg total) by mouth daily. 90 tablet 2   vitamin C (ASCORBIC ACID) 500 MG tablet Take 500 mg by mouth daily.     Current Facility-Administered Medications on File Prior to Visit  Medication Dose Route  Frequency Provider Last Rate Last Admin   cyanocobalamin ((VITAMIN B-12)) injection 1,000 mcg  1,000 mcg Intramuscular Q30 days Claretta Fraise, MD   1,000 mcg at 02/09/22 1418    ROS Review of Systems  Constitutional: Negative.   HENT: Negative.    Eyes:  Negative for visual disturbance.  Respiratory:  Negative for shortness of breath.   Cardiovascular:  Negative for chest pain.  Gastrointestinal:  Negative for abdominal pain.   Musculoskeletal:  Negative for arthralgias (taking 3 mg prednisone daily to keep pain under control).    Objective:  BP 116/74   Pulse 91   Temp 97.8 F (36.6 C)   Ht _0  (1.626 m)   Wt 188 lb 12.8 oz (85.6 kg)   LMP 04/11/1986   SpO2 98%   BMI 32.41 kg/m   BP Readings from Last 3 Encounters:  02/09/22 116/74  11/09/21 123/73  08/09/21 125/72    Wt Readings from Last 3 Encounters:  02/09/22 188 lb 12.8 oz (85.6 kg)  11/09/21 187 lb 12.8 oz (85.2 kg)  08/09/21 188 lb 9.6 oz (85.5 kg)     Physical Exam Constitutional:      General: She is not in acute distress.    Appearance: She is well-developed.  Cardiovascular:     Rate and Rhythm: Normal rate and regular rhythm.  Pulmonary:     Breath sounds: Normal breath sounds.  Musculoskeletal:        General: Normal range of motion.  Skin:    General: Skin is warm and dry.  Neurological:     Mental Status: She is alert and oriented to person, place, and time.      Lab Results  Component Value Date   HGBA1C 6.1 (H) 11/09/2021   HGBA1C 6.2 (H) 08/09/2021   HGBA1C 6.2 (H) 05/12/2021    Assessment & Plan:   Erin Ortiz was seen today for medical management of chronic issues.  Diagnoses and all orders for this visit:  Type 2 diabetes mellitus with stage 3 chronic kidney disease, without long-term current use of insulin, unspecified whether stage 3a or 3b CKD ([facility]) -     Microalbumin / creatinine urine ratio -     Bayer DCA Hb A1c Waived  Acquired hypothyroidism -     TSH + free T4  Mixed hyperlipidemia -     CMP14+EGFR -     Lipid panel  Iron deficiency anemia, unspecified iron deficiency anemia type -     CBC with Differential/Platelet -     Iron, TIBC and Ferritin Panel  Anarthritic rheumatoid disease ([place])  Other orders -     atorvastatin (LIPITOR) 40 MG tablet; Take 1 tablet (40 mg total) by mouth daily.   I am having Erin Ortiz maintain her Multiple Minerals-Vitamins (CALCIUM-MAGNESIUM-ZINC-D3  PO), Flinstones Gummies Omega-3 DHA, ascorbic acid, Cyanocobalamin, cyanocobalamin, levothyroxine, lisinopril, Accu-Chek Aviva Plus, Accu-Chek Aviva Plus, accu-chek multiclix, omeprazole, predniSONE, raloxifene, and atorvastatin. We administered cyanocobalamin. We will continue to administer cyanocobalamin.  Meds ordered this encounter  Medications   atorvastatin (LIPITOR) 40 MG tablet    Sig: Take 1 tablet (40 mg total) by mouth daily.    Dispense:  90 tablet    Refill:  3     Follow-up: Return in about 3 months (around 05/12/2022).  Claretta Fraise, M.D.

## 2022-02-10 LAB — CMP14+EGFR
ALT: 12 IU/L (ref 0–32)
AST: 16 IU/L (ref 0–40)
Albumin/Globulin Ratio: 1.6 (ref 1.2–2.2)
Albumin: 3.9 g/dL (ref 3.8–4.8)
Alkaline Phosphatase: 89 IU/L (ref 44–121)
BUN/Creatinine Ratio: 15 (ref 12–28)
BUN: 21 mg/dL (ref 8–27)
Bilirubin Total: 0.4 mg/dL (ref 0.0–1.2)
CO2: 20 mmol/L (ref 20–29)
Calcium: 9 mg/dL (ref 8.7–10.3)
Chloride: 106 mmol/L (ref 96–106)
Creatinine, Ser: 1.38 mg/dL — ABNORMAL HIGH (ref 0.57–1.00)
Globulin, Total: 2.5 g/dL (ref 1.5–4.5)
Glucose: 106 mg/dL — ABNORMAL HIGH (ref 70–99)
Potassium: 4.4 mmol/L (ref 3.5–5.2)
Sodium: 141 mmol/L (ref 134–144)
Total Protein: 6.4 g/dL (ref 6.0–8.5)
eGFR: 39 mL/min/{1.73_m2} — ABNORMAL LOW (ref >59)

## 2022-02-10 LAB — CBC WITH DIFFERENTIAL/PLATELET
Basophils Absolute: 0.1 10*3/uL (ref 0.0–0.2)
Basos: 1 %
EOS (ABSOLUTE): 0.1 10*3/uL (ref 0.0–0.4)
Eos: 2 %
Hematocrit: 31.7 % — ABNORMAL LOW (ref 34.0–46.6)
Hemoglobin: 10.2 g/dL — ABNORMAL LOW (ref 11.1–15.9)
Immature Grans (Abs): 0 10*3/uL (ref 0.0–0.1)
Immature Granulocytes: 0 %
Lymphocytes Absolute: 1.1 10*3/uL (ref 0.7–3.1)
Lymphs: 14 %
MCH: 28.9 pg (ref 26.6–33.0)
MCHC: 32.2 g/dL (ref 31.5–35.7)
MCV: 90 fL (ref 79–97)
Monocytes Absolute: 0.5 10*3/uL (ref 0.1–0.9)
Monocytes: 7 %
Neutrophils Absolute: 6.2 10*3/uL (ref 1.4–7.0)
Neutrophils: 76 %
Platelets: 191 10*3/uL (ref 150–450)
RBC: 3.53 x10E6/uL — ABNORMAL LOW (ref 3.77–5.28)
RDW: 13.9 % (ref 11.7–15.4)
WBC: 8 10*3/uL (ref 3.4–10.8)

## 2022-02-10 LAB — LIPID PANEL
Chol/HDL Ratio: 2.3 ratio (ref 0.0–4.4)
Cholesterol, Total: 132 mg/dL (ref 100–199)
HDL: 57 mg/dL (ref >39)
LDL Chol Calc (NIH): 62 mg/dL (ref 0–99)
Triglycerides: 59 mg/dL (ref 0–149)
VLDL Cholesterol Cal: 13 mg/dL (ref 5–40)

## 2022-02-10 LAB — BAYER DCA HB A1C WAIVED: HB A1C (BAYER DCA - WAIVED): 6.3 % — ABNORMAL HIGH (ref 4.8–5.6)

## 2022-02-10 LAB — IRON,TIBC AND FERRITIN PANEL
Ferritin: 291 ng/mL — ABNORMAL HIGH (ref 15–150)
Iron Saturation: 18 % (ref 15–55)
Iron: 44 ug/dL (ref 27–139)
Total Iron Binding Capacity: 248 ug/dL — ABNORMAL LOW (ref 250–450)
UIBC: 204 ug/dL (ref 118–369)

## 2022-02-10 LAB — TSH+FREE T4
Free T4: 1.76 ng/dL (ref 0.82–1.77)
TSH: 0.122 u[IU]/mL — ABNORMAL LOW (ref 0.450–4.500)

## 2022-02-11 LAB — MICROALBUMIN / CREATININE URINE RATIO
Creatinine, Urine: 27.5 mg/dL
Microalb/Creat Ratio: <11 mg/g creat (ref 0–29)
Microalbumin, Urine: <3 ug/mL

## 2022-02-11 NOTE — Progress Notes (Signed)
Hello Shauni,  Your lab result is normal and/or stable.Some minor variations that are not significant are commonly marked abnormal, but do not represent any medical problem for you.  Best regards, Claretta Fraise, M.D.

## 2022-03-15 ENCOUNTER — Ambulatory Visit (INDEPENDENT_AMBULATORY_CARE_PROVIDER_SITE_OTHER): Payer: Medicare PPO | Admitting: *Deleted

## 2022-03-15 DIAGNOSIS — E538 Deficiency of other specified B group vitamins: Secondary | ICD-10-CM | POA: Diagnosis not present

## 2022-03-15 NOTE — Progress Notes (Signed)
Vitamin B12 injection given and patient tolerated well.  

## 2022-03-21 DIAGNOSIS — D125 Benign neoplasm of sigmoid colon: Secondary | ICD-10-CM | POA: Diagnosis not present

## 2022-03-21 DIAGNOSIS — Z86010 Personal history of colonic polyps: Secondary | ICD-10-CM | POA: Diagnosis not present

## 2022-03-21 DIAGNOSIS — Z09 Encounter for follow-up examination after completed treatment for conditions other than malignant neoplasm: Secondary | ICD-10-CM | POA: Diagnosis not present

## 2022-04-14 ENCOUNTER — Ambulatory Visit (INDEPENDENT_AMBULATORY_CARE_PROVIDER_SITE_OTHER): Payer: Medicare PPO | Admitting: *Deleted

## 2022-04-14 DIAGNOSIS — E538 Deficiency of other specified B group vitamins: Secondary | ICD-10-CM | POA: Diagnosis not present

## 2022-04-14 NOTE — Progress Notes (Signed)
B12 injection given to right deltoid, intramuscular. Patient tolerated well. 

## 2022-05-17 ENCOUNTER — Encounter: Payer: Self-pay | Admitting: Family Medicine

## 2022-05-17 ENCOUNTER — Ambulatory Visit: Payer: Medicare PPO | Admitting: Family Medicine

## 2022-05-17 VITALS — BP 124/80 | HR 75 | Temp 98.2°F | Ht 64.0 in | Wt 188.0 lb

## 2022-05-17 DIAGNOSIS — E1122 Type 2 diabetes mellitus with diabetic chronic kidney disease: Secondary | ICD-10-CM

## 2022-05-17 DIAGNOSIS — N183 Chronic kidney disease, stage 3 unspecified: Secondary | ICD-10-CM

## 2022-05-17 DIAGNOSIS — E039 Hypothyroidism, unspecified: Secondary | ICD-10-CM

## 2022-05-17 DIAGNOSIS — E538 Deficiency of other specified B group vitamins: Secondary | ICD-10-CM

## 2022-05-17 DIAGNOSIS — I10 Essential (primary) hypertension: Secondary | ICD-10-CM | POA: Diagnosis not present

## 2022-05-17 DIAGNOSIS — D509 Iron deficiency anemia, unspecified: Secondary | ICD-10-CM

## 2022-05-17 DIAGNOSIS — E782 Mixed hyperlipidemia: Secondary | ICD-10-CM

## 2022-05-17 LAB — BAYER DCA HB A1C WAIVED: HB A1C (BAYER DCA - WAIVED): 6.5 % — ABNORMAL HIGH (ref 4.8–5.6)

## 2022-05-17 MED ORDER — LEVOTHYROXINE SODIUM 125 MCG PO TABS: ORAL_TABLET | ORAL | 3 refills | Status: DC

## 2022-05-17 MED ORDER — RALOXIFENE HCL 60 MG PO TABS
60.0000 mg | ORAL_TABLET | Freq: Every day | ORAL | 3 refills | Status: AC
Start: 2022-05-17 — End: ?

## 2022-05-17 MED ORDER — LISINOPRIL 20 MG PO TABS: 20.0000 mg | ORAL_TABLET | Freq: Two times a day (BID) | ORAL | 3 refills | Status: AC

## 2022-05-17 MED ORDER — PREDNISONE 1 MG PO TABS: 4.0000 mg | ORAL_TABLET | Freq: Every day | ORAL | 3 refills | Status: AC

## 2022-05-17 MED ORDER — OMEPRAZOLE 40 MG PO CPDR: 40.0000 mg | DELAYED_RELEASE_CAPSULE | Freq: Every day | ORAL | 3 refills | Status: DC

## 2022-05-17 NOTE — Progress Notes (Signed)
Subjective:  [name], female    DOB: 27-Jun-1945  Age: 77 y.o. MRN: [id number]  CC: Medical Management of Chronic Issues   HPI [name] presents forFollow-up of diabetes. Patient checks blood sugar at home.   96 fasting and 120-130 postprandial Patient denies symptoms such as polyuria, polydipsia, excessive hunger, nausea No significant hypoglycemic spells noted. Medications reviewed. Pt reports taking them regularly without complication/adverse reaction being reported today.    History Erin Ortiz has a past medical history of Anemia, Chronic kidney disease, and Diabetes mellitus without complication ([place]).   She has a past surgical history that includes jejunostomy (05/17/2008); Gastric bypass (11/2001); and Vaginal hysterectomy (12/1986).   Her family history includes Breast cancer in her maternal aunt and mother; Cancer in her father and mother.She reports that she has never smoked. She has never used smokeless tobacco. She reports that she does not drink alcohol and does not use drugs.  Current Outpatient Medications on File Prior to Visit  Medication Sig Dispense Refill   atorvastatin (LIPITOR) 40 MG tablet Take 1 tablet (40 mg total) by mouth daily. 90 tablet 3   Blood Glucose Monitoring Suppl (ACCU-CHEK AVIVA PLUS) w/Device KIT Test BS BID Dx E11.21 1 kit PRN   cyanocobalamin (,VITAMIN B-12,) 1000 MCG/ML injection Inject 1 mL (1,000 mcg total) into the skin every 30 (thirty) days. 10 mL prn   Cyanocobalamin 1000 MCG/ML KIT Give 1 ml from one vial and 0.2 ml from second vial monthly 4 kit 3   glucose blood (ACCU-CHEK AVIVA PLUS) test strip Test BS BID Dx E11.21 200 each 3   Lancets (ACCU-CHEK MULTICLIX) lancets Test BS BID Dx E11.21 200 each 3   Multiple Minerals-Vitamins (CALCIUM-MAGNESIUM-ZINC-D3 PO) Take by mouth 3 (three) times daily.     Pediatric Multiple Vit-C-FA (FLINSTONES GUMMIES OMEGA-3 DHA) CHEW Chew by mouth.     vitamin C (ASCORBIC ACID) 500 MG  tablet Take 500 mg by mouth daily.     Current Facility-Administered Medications on File Prior to Visit  Medication Dose Route Frequency Provider Last Rate Last Admin   cyanocobalamin ((VITAMIN B-12)) injection 1,000 mcg  1,000 mcg Intramuscular Q30 days Claretta Fraise, MD   1,000 mcg at 05/17/22 1408    ROS Review of Systems  Constitutional: Negative.   HENT: Negative.    Eyes:  Negative for visual disturbance.  Respiratory:  Negative for shortness of breath.   Cardiovascular:  Negative for chest pain.  Gastrointestinal:  Negative for abdominal pain.  Musculoskeletal:  Positive for arthralgias (Currently stable on 2 mill grams a day of prednisone.).    Objective:  BP 124/80   Pulse 75   Temp 98.2 F (36.8 C)   Ht 5\' 4"  (1.626 m)   Wt 188 lb (85.3 kg)   LMP 04/11/1986   SpO2 100%   BMI 32.27 kg/m   BP Readings from Last 3 Encounters:  05/17/22 124/80  02/09/22 116/74  11/09/21 123/73    Wt Readings from Last 3 Encounters:  05/17/22 188 lb (85.3 kg)  02/09/22 188 lb 12.8 oz (85.6 kg)  11/09/21 187 lb 12.8 oz (85.2 kg)     Physical Exam Constitutional:      General: She is not in acute distress.    Appearance: She is well-developed.  Cardiovascular:     Rate and Rhythm: Normal rate and regular rhythm.  Pulmonary:     Breath sounds: Normal breath sounds.  Musculoskeletal:        General: Normal range  of motion.  Skin:    General: Skin is warm and dry.  Neurological:     Mental Status: She is alert and oriented to person, place, and time.       Assessment & Plan:   Yaeli was seen today for medical management of chronic issues.  Diagnoses and all orders for this visit:  Type 2 diabetes mellitus with stage 3 chronic kidney disease, without long-term current use of insulin, unspecified whether stage 3a or 3b CKD (HCC) -     Bayer DCA Hb A1c Waived  Acquired hypothyroidism -     TSH + free T4  Mixed hyperlipidemia -     Lipid panel  Iron deficiency  anemia, unspecified iron deficiency anemia type -     Iron, TIBC and Ferritin Panel  Essential (primary) hypertension -     CBC with Differential/Platelet -     CMP14+EGFR  Other orders -     levothyroxine (EUTHYROX) 125 MCG tablet; TAKE 1 TABLET BY MOUTH IN THE MORNING 30  MINUTES  BEFORE  MEAL -     lisinopril (ZESTRIL) 20 MG tablet; Take 1 tablet (20 mg total) by mouth 2 (two) times daily. -     omeprazole (PRILOSEC) 40 MG capsule; Take 1 capsule (40 mg total) by mouth daily. -     predniSONE (DELTASONE) 1 MG tablet; Take 4 tablets (4 mg total) by mouth daily. -     raloxifene (EVISTA) 60 MG tablet; Take 1 tablet (60 mg total) by mouth daily.      I am having [name] maintain her Multiple Minerals-Vitamins (CALCIUM-MAGNESIUM-ZINC-D3 PO), Flinstones Gummies Omega-3 DHA, ascorbic acid, Cyanocobalamin, cyanocobalamin, Accu-Chek Aviva Plus, Accu-Chek Aviva Plus, accu-chek multiclix, atorvastatin, levothyroxine, lisinopril, omeprazole, predniSONE, and raloxifene. We administered cyanocobalamin. We will continue to administer cyanocobalamin.  Meds ordered this encounter  Medications   levothyroxine (EUTHYROX) 125 MCG tablet    Sig: TAKE 1 TABLET BY MOUTH IN THE MORNING 30  MINUTES  BEFORE  MEAL    Dispense:  90 tablet    Refill:  3   lisinopril (ZESTRIL) 20 MG tablet    Sig: Take 1 tablet (20 mg total) by mouth 2 (two) times daily.    Dispense:  180 tablet    Refill:  3   omeprazole (PRILOSEC) 40 MG capsule    Sig: Take 1 capsule (40 mg total) by mouth daily.    Dispense:  90 capsule    Refill:  3   predniSONE (DELTASONE) 1 MG tablet    Sig: Take 4 tablets (4 mg total) by mouth daily.    Dispense:  360 tablet    Refill:  3   raloxifene (EVISTA) 60 MG tablet    Sig: Take 1 tablet (60 mg total) by mouth daily.    Dispense:  90 tablet    Refill:  3     Follow-up: Return in about 3 months (around 08/15/2022).  Claretta Fraise, M.D.

## 2022-05-18 LAB — CMP14+EGFR
ALT: 13 IU/L (ref 0–32)
AST: 17 IU/L (ref 0–40)
Albumin/Globulin Ratio: 1.5 (ref 1.2–2.2)
Albumin: 4 g/dL (ref 3.8–4.8)
Alkaline Phosphatase: 82 IU/L (ref 44–121)
BUN/Creatinine Ratio: 20 (ref 12–28)
BUN: 30 mg/dL — ABNORMAL HIGH (ref 8–27)
Bilirubin Total: 0.3 mg/dL (ref 0.0–1.2)
CO2: 20 mmol/L (ref 20–29)
Calcium: 8.4 mg/dL — ABNORMAL LOW (ref 8.7–10.3)
Chloride: 109 mmol/L — ABNORMAL HIGH (ref 96–106)
Creatinine, Ser: 1.47 mg/dL — ABNORMAL HIGH (ref 0.57–1.00)
Globulin, Total: 2.6 g/dL (ref 1.5–4.5)
Glucose: 99 mg/dL (ref 70–99)
Potassium: 5 mmol/L (ref 3.5–5.2)
Sodium: 143 mmol/L (ref 134–144)
Total Protein: 6.6 g/dL (ref 6.0–8.5)
eGFR: 37 mL/min/{1.73_m2} — ABNORMAL LOW (ref >59)

## 2022-05-18 LAB — CBC WITH DIFFERENTIAL/PLATELET
Basophils Absolute: 0.1 10*3/uL (ref 0.0–0.2)
Basos: 1 %
EOS (ABSOLUTE): 0.2 10*3/uL (ref 0.0–0.4)
Eos: 3 %
Hematocrit: 32.4 % — ABNORMAL LOW (ref 34.0–46.6)
Hemoglobin: 10.4 g/dL — ABNORMAL LOW (ref 11.1–15.9)
Immature Grans (Abs): 0 10*3/uL (ref 0.0–0.1)
Immature Granulocytes: 0 %
Lymphocytes Absolute: 1.5 10*3/uL (ref 0.7–3.1)
Lymphs: 26 %
MCH: 28.4 pg (ref 26.6–33.0)
MCHC: 32.1 g/dL (ref 31.5–35.7)
MCV: 89 fL (ref 79–97)
Monocytes Absolute: 0.4 10*3/uL (ref 0.1–0.9)
Monocytes: 7 %
Neutrophils Absolute: 3.8 10*3/uL (ref 1.4–7.0)
Neutrophils: 63 %
Platelets: 221 10*3/uL (ref 150–450)
RBC: 3.66 x10E6/uL — ABNORMAL LOW (ref 3.77–5.28)
RDW: 13.6 % (ref 11.7–15.4)
WBC: 6 10*3/uL (ref 3.4–10.8)

## 2022-05-18 LAB — LIPID PANEL
Chol/HDL Ratio: 2.7 ratio (ref 0.0–4.4)
Cholesterol, Total: 147 mg/dL (ref 100–199)
HDL: 54 mg/dL (ref >39)
LDL Chol Calc (NIH): 75 mg/dL (ref 0–99)
Triglycerides: 95 mg/dL (ref 0–149)
VLDL Cholesterol Cal: 18 mg/dL (ref 5–40)

## 2022-05-18 LAB — TSH+FREE T4
Free T4: 1.58 ng/dL (ref 0.82–1.77)
TSH: 0.136 u[IU]/mL — ABNORMAL LOW (ref 0.450–4.500)

## 2022-05-18 LAB — IRON,TIBC AND FERRITIN PANEL
Ferritin: 305 ng/mL — ABNORMAL HIGH (ref 15–150)
Iron Saturation: 30 % (ref 15–55)
Iron: 78 ug/dL (ref 27–139)
Total Iron Binding Capacity: 261 ug/dL (ref 250–450)
UIBC: 183 ug/dL (ref 118–369)

## 2022-05-18 NOTE — Progress Notes (Signed)
Hello Erin Ortiz,  Your lab result is normal and/or stable.Some minor variations that are not significant are commonly marked abnormal, but do not represent any medical problem for you. Your iron profile looks good . The anemia is from your weak kidneys. Both the kidneys and the anemia are stable.  Best regards, Claretta Fraise, M.D.

## 2022-06-01 DIAGNOSIS — Z79899 Other long term (current) drug therapy: Secondary | ICD-10-CM | POA: Diagnosis not present

## 2022-06-01 DIAGNOSIS — N3289 Other specified disorders of bladder: Secondary | ICD-10-CM | POA: Diagnosis not present

## 2022-06-01 DIAGNOSIS — K838 Other specified diseases of biliary tract: Secondary | ICD-10-CM | POA: Diagnosis not present

## 2022-06-01 DIAGNOSIS — R1084 Generalized abdominal pain: Secondary | ICD-10-CM | POA: Diagnosis not present

## 2022-06-01 DIAGNOSIS — N189 Chronic kidney disease, unspecified: Secondary | ICD-10-CM | POA: Diagnosis not present

## 2022-06-01 DIAGNOSIS — K5730 Diverticulosis of large intestine without perforation or abscess without bleeding: Secondary | ICD-10-CM | POA: Diagnosis not present

## 2022-06-01 DIAGNOSIS — R110 Nausea: Secondary | ICD-10-CM | POA: Diagnosis not present

## 2022-06-01 DIAGNOSIS — R1010 Upper abdominal pain, unspecified: Secondary | ICD-10-CM | POA: Diagnosis not present

## 2022-06-01 DIAGNOSIS — R079 Chest pain, unspecified: Secondary | ICD-10-CM | POA: Diagnosis not present

## 2022-06-01 DIAGNOSIS — K6389 Other specified diseases of intestine: Secondary | ICD-10-CM | POA: Diagnosis not present

## 2022-06-01 DIAGNOSIS — I129 Hypertensive chronic kidney disease with stage 1 through stage 4 chronic kidney disease, or unspecified chronic kidney disease: Secondary | ICD-10-CM | POA: Diagnosis not present

## 2022-06-01 DIAGNOSIS — Z888 Allergy status to other drugs, medicaments and biological substances status: Secondary | ICD-10-CM | POA: Diagnosis not present

## 2022-06-01 DIAGNOSIS — R1013 Epigastric pain: Secondary | ICD-10-CM | POA: Diagnosis not present

## 2022-06-08 ENCOUNTER — Ambulatory Visit: Payer: Medicare PPO | Admitting: Family Medicine

## 2022-06-08 ENCOUNTER — Encounter: Payer: Self-pay | Admitting: Family Medicine

## 2022-06-08 VITALS — BP 122/74 | HR 71 | Temp 98.2°F | Ht 64.0 in | Wt 180.4 lb

## 2022-06-08 DIAGNOSIS — R1084 Generalized abdominal pain: Secondary | ICD-10-CM | POA: Diagnosis not present

## 2022-06-08 NOTE — Progress Notes (Signed)
Subjective:  [name], female    DOB: [birth year]  Age: 77 y.o. MRN: [id number]  CC: ER FOLLOW UP   HPI Erin Ortiz presents for recent E.D. visit. She was experiencing abdominal pain. It was severe. It was generalized. She was seen on 2/21. The nausea and vomiting have resolved. The pain has diminished but is still present. She had a CT scan performed while in the E.D. It showed the major organs to be intact, but there was increased gas and stool in the colon. Pt. Has been using laxatives to have BM since seen in E.D. has had some liquidy BM.     06/08/2022    9:09 AM 05/17/2022    1:48 PM 02/09/2022    1:55 PM  Depression screen PHQ 2/9  Decreased Interest 0 0 0  Down, Depressed, Hopeless 0 0 0  PHQ - 2 Score 0 0 0    History Erin Ortiz has a past medical history of Anemia, Chronic kidney disease, and Diabetes mellitus without complication ([place]).   She has a past surgical history that includes jejunostomy (05/17/2008); Gastric bypass (11/2001); and Vaginal hysterectomy (12/1986).   Her family history includes Breast cancer in her maternal aunt and mother; Cancer in her father and mother.She reports that she has never smoked. She has never used smokeless tobacco. She reports that she does not drink alcohol and does not use drugs.    ROS Review of Systems  Constitutional: Negative.   HENT: Negative.    Eyes:  Negative for visual disturbance.  Respiratory:  Negative for shortness of breath.   Cardiovascular:  Negative for chest pain.  Gastrointestinal:  Positive for abdominal distention and abdominal pain. Negative for blood in stool.  Musculoskeletal:  Negative for arthralgias.    Objective:  BP 122/74   Pulse 71   Temp 98.2 F (36.8 C)   Ht '5\' 4"'$  (1.626 m)   Wt 180 lb 6.4 oz (81.8 kg)   LMP 04/11/1986   SpO2 99%   BMI 30.97 kg/m   BP Readings from Last 3 Encounters:  06/08/22 122/74  05/17/22 124/80  02/09/22 116/74    Wt Readings from Last 3  Encounters:  06/08/22 180 lb 6.4 oz (81.8 kg)  05/17/22 188 lb (85.3 kg)  02/09/22 188 lb 12.8 oz (85.6 kg)     Physical Exam Constitutional:      General: She is not in acute distress.    Appearance: She is well-developed.  HENT:     Head: Normocephalic and atraumatic.  Eyes:     Conjunctiva/sclera: Conjunctivae normal.     Pupils: Pupils are equal, round, and reactive to light.  Neck:     Thyroid: No thyromegaly.  Cardiovascular:     Rate and Rhythm: Normal rate and regular rhythm.     Heart sounds: Normal heart sounds. No murmur heard. Pulmonary:     Effort: Pulmonary effort is normal. No respiratory distress.     Breath sounds: Normal breath sounds. No wheezing or rales.  Abdominal:     General: Bowel sounds are normal. There is distension.     Palpations: Abdomen is soft.     Tenderness: There is abdominal tenderness (mild and diffuse).  Musculoskeletal:        General: Normal range of motion.     Cervical back: Normal range of motion and neck supple.  Lymphadenopathy:     Cervical: No cervical adenopathy.  Skin:    General: Skin is warm and dry.  Neurological:     Mental Status: She is alert and oriented to person, place, and time.  Psychiatric:        Behavior: Behavior normal.        Thought Content: Thought content normal.        Judgment: Judgment normal.       Assessment & Plan:   Erin Ortiz was seen today for er follow up.  Diagnoses and all orders for this visit:  Generalized abdominal pain -     CBC with Differential/Platelet -     CMP14+EGFR -     Lipase       I am having Erin Ortiz maintain her Multiple Minerals-Vitamins (CALCIUM-MAGNESIUM-ZINC-D3 PO), Flinstones Gummies Omega-3 DHA, ascorbic acid, Cyanocobalamin, cyanocobalamin, Accu-Chek Aviva Plus, Accu-Chek Aviva Plus, accu-chek multiclix, atorvastatin, levothyroxine, lisinopril, omeprazole, predniSONE, and raloxifene. We will continue to administer cyanocobalamin.  Allergies as of  06/08/2022       Reactions   Januvia [sitagliptin] Nausea Only        Medication List        Accurate as of June 08, 2022  9:13 PM. If you have any questions, ask your nurse or doctor.          Accu-Chek Aviva Plus test strip Generic drug: glucose blood Test BS BID Dx E11.21   Accu-Chek Aviva Plus w/Device Kit Test BS BID Dx E11.21   accu-chek multiclix lancets Test BS BID Dx E11.21   ascorbic acid 500 MG tablet Commonly known as: VITAMIN C Take 500 mg by mouth daily.   atorvastatin 40 MG tablet Commonly known as: LIPITOR Take 1 tablet (40 mg total) by mouth daily.   CALCIUM-MAGNESIUM-ZINC-D3 PO Take by mouth 3 (three) times daily.   Cyanocobalamin 1000 MCG/ML Kit Give 1 ml from one vial and 0.2 ml from second vial monthly   cyanocobalamin 1000 MCG/ML injection Commonly known as: VITAMIN B12 Inject 1 mL (1,000 mcg total) into the skin every 30 (thirty) days.   Flinstones Gummies Omega-3 DHA Chew Chew by mouth.   levothyroxine 125 MCG tablet Commonly known as: Euthyrox TAKE 1 TABLET BY MOUTH IN THE MORNING 30  MINUTES  BEFORE  MEAL   lisinopril 20 MG tablet Commonly known as: ZESTRIL Take 1 tablet (20 mg total) by mouth 2 (two) times daily.   omeprazole 40 MG capsule Commonly known as: PRILOSEC Take 1 capsule (40 mg total) by mouth daily.   predniSONE 1 MG tablet Commonly known as: DELTASONE Take 4 tablets (4 mg total) by mouth daily.   raloxifene 60 MG tablet Commonly known as: EVISTA Take 1 tablet (60 mg total) by mouth daily.         Follow-up: Return in about 1 month (around 07/07/2022), or if symptoms worsen or fail to improve.  Claretta Fraise, M.D.

## 2022-06-09 ENCOUNTER — Telehealth: Payer: Self-pay | Admitting: Family Medicine

## 2022-06-09 ENCOUNTER — Telehealth: Payer: Self-pay

## 2022-06-09 DIAGNOSIS — K56609 Unspecified intestinal obstruction, unspecified as to partial versus complete obstruction: Secondary | ICD-10-CM | POA: Diagnosis not present

## 2022-06-09 DIAGNOSIS — N281 Cyst of kidney, acquired: Secondary | ICD-10-CM | POA: Diagnosis not present

## 2022-06-09 DIAGNOSIS — Z4682 Encounter for fitting and adjustment of non-vascular catheter: Secondary | ICD-10-CM | POA: Diagnosis not present

## 2022-06-09 DIAGNOSIS — E039 Hypothyroidism, unspecified: Secondary | ICD-10-CM | POA: Diagnosis not present

## 2022-06-09 DIAGNOSIS — E785 Hyperlipidemia, unspecified: Secondary | ICD-10-CM | POA: Diagnosis not present

## 2022-06-09 DIAGNOSIS — M353 Polymyalgia rheumatica: Secondary | ICD-10-CM | POA: Diagnosis not present

## 2022-06-09 DIAGNOSIS — K5652 Intestinal adhesions [bands] with complete obstruction: Secondary | ICD-10-CM | POA: Diagnosis not present

## 2022-06-09 DIAGNOSIS — D509 Iron deficiency anemia, unspecified: Secondary | ICD-10-CM | POA: Diagnosis not present

## 2022-06-09 DIAGNOSIS — N261 Atrophy of kidney (terminal): Secondary | ICD-10-CM | POA: Diagnosis not present

## 2022-06-09 DIAGNOSIS — R109 Unspecified abdominal pain: Secondary | ICD-10-CM | POA: Diagnosis not present

## 2022-06-09 DIAGNOSIS — K5651 Intestinal adhesions [bands], with partial obstruction: Secondary | ICD-10-CM | POA: Diagnosis not present

## 2022-06-09 DIAGNOSIS — I129 Hypertensive chronic kidney disease with stage 1 through stage 4 chronic kidney disease, or unspecified chronic kidney disease: Secondary | ICD-10-CM | POA: Diagnosis not present

## 2022-06-09 DIAGNOSIS — K219 Gastro-esophageal reflux disease without esophagitis: Secondary | ICD-10-CM | POA: Diagnosis not present

## 2022-06-09 DIAGNOSIS — E860 Dehydration: Secondary | ICD-10-CM | POA: Diagnosis not present

## 2022-06-09 DIAGNOSIS — N1832 Chronic kidney disease, stage 3b: Secondary | ICD-10-CM | POA: Diagnosis not present

## 2022-06-09 DIAGNOSIS — K56699 Other intestinal obstruction unspecified as to partial versus complete obstruction: Secondary | ICD-10-CM | POA: Diagnosis not present

## 2022-06-09 DIAGNOSIS — K7689 Other specified diseases of liver: Secondary | ICD-10-CM | POA: Diagnosis not present

## 2022-06-09 DIAGNOSIS — N179 Acute kidney failure, unspecified: Secondary | ICD-10-CM | POA: Diagnosis not present

## 2022-06-09 DIAGNOSIS — N189 Chronic kidney disease, unspecified: Secondary | ICD-10-CM | POA: Diagnosis not present

## 2022-06-09 DIAGNOSIS — E1122 Type 2 diabetes mellitus with diabetic chronic kidney disease: Secondary | ICD-10-CM | POA: Diagnosis not present

## 2022-06-09 DIAGNOSIS — K565 Intestinal adhesions [bands], unspecified as to partial versus complete obstruction: Secondary | ICD-10-CM | POA: Diagnosis not present

## 2022-06-09 LAB — CBC WITH DIFFERENTIAL/PLATELET
Basophils Absolute: 0.1 10*3/uL (ref 0.0–0.2)
Basos: 1 %
EOS (ABSOLUTE): 0.2 10*3/uL (ref 0.0–0.4)
Eos: 2 %
Hematocrit: 32.2 % — ABNORMAL LOW (ref 34.0–46.6)
Hemoglobin: 10.4 g/dL — ABNORMAL LOW (ref 11.1–15.9)
Immature Grans (Abs): 0 10*3/uL (ref 0.0–0.1)
Immature Granulocytes: 0 %
Lymphocytes Absolute: 1.1 10*3/uL (ref 0.7–3.1)
Lymphs: 15 %
MCH: 28.2 pg (ref 26.6–33.0)
MCHC: 32.3 g/dL (ref 31.5–35.7)
MCV: 87 fL (ref 79–97)
Monocytes Absolute: 0.5 10*3/uL (ref 0.1–0.9)
Monocytes: 7 %
Neutrophils Absolute: 5.2 10*3/uL (ref 1.4–7.0)
Neutrophils: 75 %
Platelets: 192 10*3/uL (ref 150–450)
RBC: 3.69 x10E6/uL — ABNORMAL LOW (ref 3.77–5.28)
RDW: 13.8 % (ref 11.7–15.4)
WBC: 7 10*3/uL (ref 3.4–10.8)

## 2022-06-09 LAB — CMP14+EGFR
ALT: 13 IU/L (ref 0–32)
AST: 21 IU/L (ref 0–40)
Albumin/Globulin Ratio: 1.8 (ref 1.2–2.2)
Albumin: 4.2 g/dL (ref 3.8–4.8)
Alkaline Phosphatase: 73 IU/L (ref 44–121)
BUN/Creatinine Ratio: 14 (ref 12–28)
BUN: 24 mg/dL (ref 8–27)
Bilirubin Total: 0.5 mg/dL (ref 0.0–1.2)
CO2: 20 mmol/L (ref 20–29)
Calcium: 8.8 mg/dL (ref 8.7–10.3)
Chloride: 102 mmol/L (ref 96–106)
Creatinine, Ser: 1.69 mg/dL — ABNORMAL HIGH (ref 0.57–1.00)
Globulin, Total: 2.3 g/dL (ref 1.5–4.5)
Glucose: 122 mg/dL — ABNORMAL HIGH (ref 70–99)
Potassium: 4.6 mmol/L (ref 3.5–5.2)
Sodium: 137 mmol/L (ref 134–144)
Total Protein: 6.5 g/dL (ref 6.0–8.5)
eGFR: 31 mL/min/{1.73_m2} — ABNORMAL LOW (ref >59)

## 2022-06-09 LAB — LIPASE: Lipase: 107 U/L — ABNORMAL HIGH (ref 14–85)

## 2022-06-09 NOTE — Telephone Encounter (Signed)
Patient called to report that she began experiencing severe abdominal pain again last night about 7 pm.  She rates it all night and this morning as a 10/10.  No nausea or vomiting, just severe pain.  Patient was advised to go to ER for evaluation and agrees with recommendation.

## 2022-06-09 NOTE — Telephone Encounter (Signed)
Talked to triage

## 2022-06-16 ENCOUNTER — Telehealth: Payer: Self-pay

## 2022-06-16 ENCOUNTER — Ambulatory Visit: Payer: Medicare PPO

## 2022-06-16 NOTE — Transitions of Care (Post Inpatient/ED Visit) (Signed)
   06/16/2022  Name: Erin Ortiz MRN: [id number] DOB: [birth year]  Today's TOC FU Call Status: Today's TOC FU Call Status:: Successful TOC FU Call Competed TOC FU Call Complete Date: 06/16/22  Transition Care Management Follow-up Telephone Call Date of Discharge: 06/15/22 Discharge Facility: Other ([facility]) Name of Other ([facility]) Discharge Facility: [facility] Type of Discharge: Inpatient Admission Primary Inpatient Discharge Diagnosis:: small bowel obstruction How have you been since you were released from the hospital?: Better (I am eating OK.  My bowels moved today.  My wound looks good) Any questions or concerns?: No  Items Reviewed: Did you receive and understand the discharge instructions provided?: Yes Medications obtained and verified?: Yes (Medications Reviewed) Any new allergies since your discharge?: No Dietary orders reviewed?: Yes Type of Diet Ordered:: regular Do you have support at home?: Yes People in Home: child(ren), adult Name of Support/Comfort Primary Source: daughter  Home Care and Equipment/Supplies: [place] Ordered?: NA Any new equipment or medical supplies ordered?: Yes (rolling walker, bedside commode) Name of Medical supply agency?: [employer] Were you able to get the equipment/medical supplies?: Yes Do you have any questions related to the use of the equipment/supplies?: No  Functional Questionnaire: Do you need assistance with bathing/showering or dressing?: No Do you need assistance with meal preparation?: No Do you need assistance with eating?: No Do you have difficulty maintaining continence: No Do you need assistance with getting out of bed/getting out of a chair/moving?: No Do you have difficulty managing or taking your medications?: No  Folllow up appointments reviewed: PCP Follow-up appointment confirmed?: Yes Date of PCP follow-up appointment?: 06/21/22 Follow-up Provider: Dr.  Livia Snellen Specialist [facility] Follow-up appointment confirmed?: Yes Date of Specialist follow-up appointment?: 06/23/22 Follow-Up Specialty Provider:: Dr. West Carbo surgery Do you need transportation to your follow-up appointment?: No Do you understand care options if your condition(s) worsen?: Yes-patient verbalized understanding  SDOH Interventions Today    Flowsheet Row Most Recent Value  SDOH Interventions   Food Insecurity Interventions Intervention Not Indicated  Transportation Interventions Intervention Not Indicated      TOC Interventions Today    Flowsheet Row Most Recent Value  TOC Interventions   TOC Interventions Discussed/Reviewed TOC Interventions Discussed, Arranged PCP follow up within 7 days/Care Guide scheduled, Post discharge activity limitations per provider, Post op wound/incision care, S/S of infection        SIGNATURE Peter Garter RN, BSN,CCM, [place] Management Coordinator [facility] [phone]

## 2022-06-19 DIAGNOSIS — E7849 Other hyperlipidemia: Secondary | ICD-10-CM | POA: Diagnosis not present

## 2022-06-19 DIAGNOSIS — E039 Hypothyroidism, unspecified: Secondary | ICD-10-CM | POA: Diagnosis not present

## 2022-06-19 DIAGNOSIS — K219 Gastro-esophageal reflux disease without esophagitis: Secondary | ICD-10-CM | POA: Diagnosis not present

## 2022-06-19 DIAGNOSIS — R110 Nausea: Secondary | ICD-10-CM | POA: Diagnosis not present

## 2022-06-19 DIAGNOSIS — I129 Hypertensive chronic kidney disease with stage 1 through stage 4 chronic kidney disease, or unspecified chronic kidney disease: Secondary | ICD-10-CM | POA: Diagnosis not present

## 2022-06-19 DIAGNOSIS — D62 Acute posthemorrhagic anemia: Secondary | ICD-10-CM | POA: Diagnosis not present

## 2022-06-19 DIAGNOSIS — Z9049 Acquired absence of other specified parts of digestive tract: Secondary | ICD-10-CM | POA: Diagnosis not present

## 2022-06-19 DIAGNOSIS — K91872 Postprocedural seroma of a digestive system organ or structure following a digestive system procedure: Secondary | ICD-10-CM | POA: Diagnosis not present

## 2022-06-19 DIAGNOSIS — M353 Polymyalgia rheumatica: Secondary | ICD-10-CM | POA: Diagnosis not present

## 2022-06-19 DIAGNOSIS — E785 Hyperlipidemia, unspecified: Secondary | ICD-10-CM | POA: Diagnosis not present

## 2022-06-19 DIAGNOSIS — T8132XD Disruption of internal operation (surgical) wound, not elsewhere classified, subsequent encounter: Secondary | ICD-10-CM | POA: Diagnosis not present

## 2022-06-19 DIAGNOSIS — N183 Chronic kidney disease, stage 3 unspecified: Secondary | ICD-10-CM | POA: Diagnosis not present

## 2022-06-19 DIAGNOSIS — I82442 Acute embolism and thrombosis of left tibial vein: Secondary | ICD-10-CM | POA: Diagnosis not present

## 2022-06-19 DIAGNOSIS — R58 Hemorrhage, not elsewhere classified: Secondary | ICD-10-CM | POA: Diagnosis not present

## 2022-06-19 DIAGNOSIS — T8131XD Disruption of external operation (surgical) wound, not elsewhere classified, subsequent encounter: Secondary | ICD-10-CM | POA: Diagnosis not present

## 2022-06-19 DIAGNOSIS — E1122 Type 2 diabetes mellitus with diabetic chronic kidney disease: Secondary | ICD-10-CM | POA: Diagnosis not present

## 2022-06-19 DIAGNOSIS — N1832 Chronic kidney disease, stage 3b: Secondary | ICD-10-CM | POA: Diagnosis not present

## 2022-06-19 DIAGNOSIS — T8131XA Disruption of external operation (surgical) wound, not elsewhere classified, initial encounter: Secondary | ICD-10-CM | POA: Diagnosis not present

## 2022-06-19 DIAGNOSIS — E119 Type 2 diabetes mellitus without complications: Secondary | ICD-10-CM | POA: Diagnosis not present

## 2022-06-19 DIAGNOSIS — I10 Essential (primary) hypertension: Secondary | ICD-10-CM | POA: Diagnosis not present

## 2022-06-19 DIAGNOSIS — M810 Age-related osteoporosis without current pathological fracture: Secondary | ICD-10-CM | POA: Diagnosis not present

## 2022-06-19 DIAGNOSIS — T8132XA Disruption of internal operation (surgical) wound, not elsewhere classified, initial encounter: Secondary | ICD-10-CM | POA: Diagnosis not present

## 2022-06-19 DIAGNOSIS — T8130XA Disruption of wound, unspecified, initial encounter: Secondary | ICD-10-CM | POA: Diagnosis not present

## 2022-06-21 ENCOUNTER — Inpatient Hospital Stay: Payer: Medicare PPO | Admitting: Family Medicine

## 2022-06-25 DIAGNOSIS — I82402 Acute embolism and thrombosis of unspecified deep veins of left lower extremity: Secondary | ICD-10-CM | POA: Diagnosis not present

## 2022-06-25 DIAGNOSIS — M810 Age-related osteoporosis without current pathological fracture: Secondary | ICD-10-CM | POA: Diagnosis not present

## 2022-06-25 DIAGNOSIS — E785 Hyperlipidemia, unspecified: Secondary | ICD-10-CM | POA: Diagnosis not present

## 2022-06-25 DIAGNOSIS — T8132XD Disruption of internal operation (surgical) wound, not elsewhere classified, subsequent encounter: Secondary | ICD-10-CM | POA: Diagnosis not present

## 2022-06-25 DIAGNOSIS — E034 Atrophy of thyroid (acquired): Secondary | ICD-10-CM | POA: Diagnosis not present

## 2022-06-25 DIAGNOSIS — E039 Hypothyroidism, unspecified: Secondary | ICD-10-CM | POA: Diagnosis not present

## 2022-06-25 DIAGNOSIS — E538 Deficiency of other specified B group vitamins: Secondary | ICD-10-CM | POA: Diagnosis not present

## 2022-06-25 DIAGNOSIS — K219 Gastro-esophageal reflux disease without esophagitis: Secondary | ICD-10-CM | POA: Diagnosis not present

## 2022-06-25 DIAGNOSIS — T8149XD Infection following a procedure, other surgical site, subsequent encounter: Secondary | ICD-10-CM | POA: Diagnosis not present

## 2022-06-25 DIAGNOSIS — E119 Type 2 diabetes mellitus without complications: Secondary | ICD-10-CM | POA: Diagnosis not present

## 2022-06-25 DIAGNOSIS — M353 Polymyalgia rheumatica: Secondary | ICD-10-CM | POA: Diagnosis not present

## 2022-06-25 DIAGNOSIS — K565 Intestinal adhesions [bands], unspecified as to partial versus complete obstruction: Secondary | ICD-10-CM | POA: Diagnosis not present

## 2022-06-25 DIAGNOSIS — E782 Mixed hyperlipidemia: Secondary | ICD-10-CM | POA: Diagnosis not present

## 2022-06-25 DIAGNOSIS — Z9049 Acquired absence of other specified parts of digestive tract: Secondary | ICD-10-CM | POA: Diagnosis not present

## 2022-06-25 DIAGNOSIS — N183 Chronic kidney disease, stage 3 unspecified: Secondary | ICD-10-CM | POA: Diagnosis not present

## 2022-06-25 DIAGNOSIS — I10 Essential (primary) hypertension: Secondary | ICD-10-CM | POA: Diagnosis not present

## 2022-06-28 DIAGNOSIS — M353 Polymyalgia rheumatica: Secondary | ICD-10-CM | POA: Diagnosis not present

## 2022-06-28 DIAGNOSIS — M810 Age-related osteoporosis without current pathological fracture: Secondary | ICD-10-CM | POA: Diagnosis not present

## 2022-06-28 DIAGNOSIS — I10 Essential (primary) hypertension: Secondary | ICD-10-CM | POA: Diagnosis not present

## 2022-06-28 DIAGNOSIS — I82402 Acute embolism and thrombosis of unspecified deep veins of left lower extremity: Secondary | ICD-10-CM | POA: Diagnosis not present

## 2022-06-28 DIAGNOSIS — K565 Intestinal adhesions [bands], unspecified as to partial versus complete obstruction: Secondary | ICD-10-CM | POA: Diagnosis not present

## 2022-06-28 DIAGNOSIS — E538 Deficiency of other specified B group vitamins: Secondary | ICD-10-CM | POA: Diagnosis not present

## 2022-06-28 DIAGNOSIS — E782 Mixed hyperlipidemia: Secondary | ICD-10-CM | POA: Diagnosis not present

## 2022-06-28 DIAGNOSIS — K219 Gastro-esophageal reflux disease without esophagitis: Secondary | ICD-10-CM | POA: Diagnosis not present

## 2022-06-28 DIAGNOSIS — E034 Atrophy of thyroid (acquired): Secondary | ICD-10-CM | POA: Diagnosis not present

## 2022-06-30 DIAGNOSIS — K565 Intestinal adhesions [bands], unspecified as to partial versus complete obstruction: Secondary | ICD-10-CM | POA: Diagnosis not present

## 2022-06-30 DIAGNOSIS — M810 Age-related osteoporosis without current pathological fracture: Secondary | ICD-10-CM | POA: Diagnosis not present

## 2022-06-30 DIAGNOSIS — M353 Polymyalgia rheumatica: Secondary | ICD-10-CM | POA: Diagnosis not present

## 2022-06-30 DIAGNOSIS — E538 Deficiency of other specified B group vitamins: Secondary | ICD-10-CM | POA: Diagnosis not present

## 2022-06-30 DIAGNOSIS — I10 Essential (primary) hypertension: Secondary | ICD-10-CM | POA: Diagnosis not present

## 2022-06-30 DIAGNOSIS — K219 Gastro-esophageal reflux disease without esophagitis: Secondary | ICD-10-CM | POA: Diagnosis not present

## 2022-06-30 DIAGNOSIS — E782 Mixed hyperlipidemia: Secondary | ICD-10-CM | POA: Diagnosis not present

## 2022-06-30 DIAGNOSIS — E034 Atrophy of thyroid (acquired): Secondary | ICD-10-CM | POA: Diagnosis not present

## 2022-06-30 DIAGNOSIS — I82402 Acute embolism and thrombosis of unspecified deep veins of left lower extremity: Secondary | ICD-10-CM | POA: Diagnosis not present

## 2022-07-01 DIAGNOSIS — T8149XD Infection following a procedure, other surgical site, subsequent encounter: Secondary | ICD-10-CM | POA: Diagnosis not present

## 2022-07-07 DIAGNOSIS — Z09 Encounter for follow-up examination after completed treatment for conditions other than malignant neoplasm: Secondary | ICD-10-CM | POA: Diagnosis not present

## 2022-07-07 DIAGNOSIS — Z1330 Encounter for screening examination for mental health and behavioral disorders, unspecified: Secondary | ICD-10-CM | POA: Diagnosis not present

## 2022-07-20 ENCOUNTER — Encounter: Payer: Self-pay | Admitting: Family Medicine

## 2022-07-20 ENCOUNTER — Ambulatory Visit: Payer: Medicare PPO | Admitting: Family Medicine

## 2022-07-20 VITALS — BP 109/73 | HR 89 | Temp 97.9°F | Ht 64.0 in | Wt 169.6 lb

## 2022-07-20 DIAGNOSIS — K56609 Unspecified intestinal obstruction, unspecified as to partial versus complete obstruction: Secondary | ICD-10-CM

## 2022-07-20 DIAGNOSIS — E039 Hypothyroidism, unspecified: Secondary | ICD-10-CM | POA: Diagnosis not present

## 2022-07-20 NOTE — Progress Notes (Signed)
Subjective:  [name], female    DOB: 09-18-1945  Age: 77 y.o. MRN: [id number]  CC: Hospitalization Follow-up   HPI [name] presents for recent hospitalization for SBO. Had a post op bleed that led to second surgery. Apparently 80% of the tissue from the first surgery had died by the time she came back. In . After hospital release from the first surgery, she had a serious bleed from the incision. She went back to the E.D. She had emergency second surgery and multiple transfusions. She spent 10 days in rehab afterward. Now sore, but getting better. Has an open incision that has to be packed BID with nu gauze.     07/20/2022    3:54 PM 06/08/2022    9:09 AM 05/17/2022    1:48 PM  Depression screen PHQ 2/9  Decreased Interest 0 0 0  Down, Depressed, Hopeless 0 0 0  PHQ - 2 Score 0 0 0    History Erin Ortiz has a past medical history of Anemia, Chronic kidney disease, and Diabetes mellitus without complication.   She has a past surgical history that includes jejunostomy (05/17/2008); Gastric bypass (11/2001); and Vaginal hysterectomy (12/1986).   Her family history includes Breast cancer in her maternal aunt and mother; Cancer in her father and mother.She reports that she has never smoked. She has never used smokeless tobacco. She reports that she does not drink alcohol and does not use drugs.    ROS Review of Systems  Constitutional:  Positive for activity change and appetite change.  HENT: Negative.    Eyes:  Negative for visual disturbance.  Respiratory:  Negative for shortness of breath.   Cardiovascular:  Negative for chest pain.  Gastrointestinal:  Positive for abdominal pain.  Musculoskeletal:  Negative for arthralgias.    Objective:  BP 109/73   Pulse 89   Temp 97.9 F (36.6 C)   Ht 5\' 4"  (1.626 m)   Wt 169 lb 9.6 oz (76.9 kg)   LMP 04/11/1986   SpO2 98%   BMI 29.11 kg/m   BP Readings from Last 3 Encounters:  07/20/22 109/73  06/08/22 122/74   05/17/22 124/80    Wt Readings from Last 3 Encounters:  07/20/22 169 lb 9.6 oz (76.9 kg)  06/08/22 180 lb 6.4 oz (81.8 kg)  05/17/22 188 lb (85.3 kg)     Physical Exam Constitutional:      General: She is not in acute distress.    Appearance: She is well-developed.  Cardiovascular:     Rate and Rhythm: Normal rate and regular rhythm.  Pulmonary:     Breath sounds: Normal breath sounds.  Abdominal:     Comments: Midline surgical incision held together with sutures superiorly and inferiorly. There is 4 mm of open wound to the fascia that has a nugauze packing. This was removed. Wound cleaned with sterile water and repacked. Covered with ABD pad.  Musculoskeletal:        General: Normal range of motion.  Skin:    General: Skin is warm and dry.  Neurological:     Mental Status: She is alert and oriented to person, place, and time.       Assessment & Plan:   Veryle was seen today for hospitalization follow-up.  Diagnoses and all orders for this visit:  Small bowel obstruction  Hypothyroidism, unspecified type -     TSH + free T4 -     CMP14+EGFR -     CBC with Differential/Platelet  Pt. To continue dressings at home.  I am having [name] maintain her Multiple Minerals-Vitamins (CALCIUM-MAGNESIUM-ZINC-D3 PO), Flinstones Gummies Omega-3 DHA, ascorbic acid, Cyanocobalamin, cyanocobalamin, Accu-Chek Aviva Plus, Accu-Chek Aviva Plus, accu-chek multiclix, atorvastatin, levothyroxine, lisinopril, omeprazole, predniSONE, raloxifene, and apixaban. We will continue to administer cyanocobalamin.  Allergies as of 07/20/2022       Reactions   Januvia [sitagliptin] Nausea Only        Medication List        Accurate as of July 20, 2022  9:34 PM. If you have any questions, ask your nurse or doctor.          Accu-Chek Aviva Plus test strip Generic drug: glucose blood Test BS BID Dx E11.21   Accu-Chek Aviva Plus w/Device Kit Test BS BID Dx E11.21    accu-chek multiclix lancets Test BS BID Dx E11.21   ascorbic acid 500 MG tablet Commonly known as: VITAMIN C Take 500 mg by mouth daily.   atorvastatin 40 MG tablet Commonly known as: LIPITOR Take 1 tablet (40 mg total) by mouth daily.   CALCIUM-MAGNESIUM-ZINC-D3 PO Take by mouth 3 (three) times daily.   Cyanocobalamin 1000 MCG/ML Kit Give 1 ml from one vial and 0.2 ml from second vial monthly   cyanocobalamin 1000 MCG/ML injection Commonly known as: VITAMIN B12 Inject 1 mL (1,000 mcg total) into the skin every 30 (thirty) days.   Eliquis 5 MG Tabs tablet Generic drug: apixaban Take 5 mg by mouth 2 (two) times daily.   Flinstones Gummies Omega-3 DHA Chew Chew by mouth.   levothyroxine 125 MCG tablet Commonly known as: Euthyrox TAKE 1 TABLET BY MOUTH IN THE MORNING 30  MINUTES  BEFORE  MEAL   lisinopril 20 MG tablet Commonly known as: ZESTRIL Take 1 tablet (20 mg total) by mouth 2 (two) times daily.   omeprazole 40 MG capsule Commonly known as: PRILOSEC Take 1 capsule (40 mg total) by mouth daily.   predniSONE 1 MG tablet Commonly known as: DELTASONE Take 4 tablets (4 mg total) by mouth daily.   raloxifene 60 MG tablet Commonly known as: EVISTA Take 1 tablet (60 mg total) by mouth daily.         Follow-up: Return in about 1 month (around 08/19/2022).  Mechele Claude, M.D.

## 2022-07-21 LAB — CBC WITH DIFFERENTIAL/PLATELET
Basophils Absolute: 0.1 10*3/uL (ref 0.0–0.2)
Basos: 1 %
EOS (ABSOLUTE): 0.1 10*3/uL (ref 0.0–0.4)
Eos: 1 %
Hematocrit: 29.9 % — ABNORMAL LOW (ref 34.0–46.6)
Hemoglobin: 9.1 g/dL — ABNORMAL LOW (ref 11.1–15.9)
Immature Grans (Abs): 0.1 10*3/uL (ref 0.0–0.1)
Immature Granulocytes: 1 %
Lymphocytes Absolute: 1.5 10*3/uL (ref 0.7–3.1)
Lymphs: 18 %
MCH: 27.7 pg (ref 26.6–33.0)
MCHC: 30.4 g/dL — ABNORMAL LOW (ref 31.5–35.7)
MCV: 91 fL (ref 79–97)
Monocytes Absolute: 0.6 10*3/uL (ref 0.1–0.9)
Monocytes: 7 %
Neutrophils Absolute: 6.1 10*3/uL (ref 1.4–7.0)
Neutrophils: 72 %
Platelets: 226 10*3/uL (ref 150–450)
RBC: 3.29 x10E6/uL — ABNORMAL LOW (ref 3.77–5.28)
RDW: 15.9 % — ABNORMAL HIGH (ref 11.7–15.4)
WBC: 8.3 10*3/uL (ref 3.4–10.8)

## 2022-07-21 LAB — CMP14+EGFR
ALT: 11 IU/L (ref 0–32)
AST: 19 IU/L (ref 0–40)
Albumin/Globulin Ratio: 1.4 (ref 1.2–2.2)
Albumin: 3.6 g/dL — ABNORMAL LOW (ref 3.8–4.8)
Alkaline Phosphatase: 81 IU/L (ref 44–121)
BUN/Creatinine Ratio: 13 (ref 12–28)
BUN: 17 mg/dL (ref 8–27)
Bilirubin Total: 0.5 mg/dL (ref 0.0–1.2)
CO2: 25 mmol/L (ref 20–29)
Calcium: 8.9 mg/dL (ref 8.7–10.3)
Chloride: 104 mmol/L (ref 96–106)
Creatinine, Ser: 1.31 mg/dL — ABNORMAL HIGH (ref 0.57–1.00)
Globulin, Total: 2.6 g/dL (ref 1.5–4.5)
Glucose: 131 mg/dL — ABNORMAL HIGH (ref 70–99)
Potassium: 4.2 mmol/L (ref 3.5–5.2)
Sodium: 142 mmol/L (ref 134–144)
Total Protein: 6.2 g/dL (ref 6.0–8.5)
eGFR: 42 mL/min/{1.73_m2} — ABNORMAL LOW (ref >59)

## 2022-07-21 LAB — TSH+FREE T4
Free T4: 1.76 ng/dL (ref 0.82–1.77)
TSH: 0.431 u[IU]/mL — ABNORMAL LOW (ref 0.450–4.500)

## 2022-08-02 DIAGNOSIS — L579 Skin changes due to chronic exposure to nonionizing radiation, unspecified: Secondary | ICD-10-CM | POA: Diagnosis not present

## 2022-08-02 DIAGNOSIS — Z85828 Personal history of other malignant neoplasm of skin: Secondary | ICD-10-CM | POA: Diagnosis not present

## 2022-08-02 DIAGNOSIS — L570 Actinic keratosis: Secondary | ICD-10-CM | POA: Diagnosis not present

## 2022-08-02 DIAGNOSIS — L821 Other seborrheic keratosis: Secondary | ICD-10-CM | POA: Diagnosis not present

## 2022-08-02 DIAGNOSIS — L814 Other melanin hyperpigmentation: Secondary | ICD-10-CM | POA: Diagnosis not present

## 2022-08-16 ENCOUNTER — Encounter: Payer: Self-pay | Admitting: Family Medicine

## 2022-08-16 ENCOUNTER — Ambulatory Visit: Payer: Medicare PPO | Admitting: Family Medicine

## 2022-08-16 VITALS — BP 120/70 | HR 85 | Temp 97.3°F | Ht 64.0 in | Wt 164.2 lb

## 2022-08-16 DIAGNOSIS — E1122 Type 2 diabetes mellitus with diabetic chronic kidney disease: Secondary | ICD-10-CM

## 2022-08-16 DIAGNOSIS — N183 Chronic kidney disease, stage 3 unspecified: Secondary | ICD-10-CM

## 2022-08-16 DIAGNOSIS — D509 Iron deficiency anemia, unspecified: Secondary | ICD-10-CM | POA: Diagnosis not present

## 2022-08-16 DIAGNOSIS — E538 Deficiency of other specified B group vitamins: Secondary | ICD-10-CM

## 2022-08-16 DIAGNOSIS — I10 Essential (primary) hypertension: Secondary | ICD-10-CM

## 2022-08-16 DIAGNOSIS — E782 Mixed hyperlipidemia: Secondary | ICD-10-CM

## 2022-08-16 DIAGNOSIS — E039 Hypothyroidism, unspecified: Secondary | ICD-10-CM | POA: Diagnosis not present

## 2022-08-16 DIAGNOSIS — Z794 Long term (current) use of insulin: Secondary | ICD-10-CM

## 2022-08-16 LAB — BAYER DCA HB A1C WAIVED: HB A1C (BAYER DCA - WAIVED): 6.1 % — ABNORMAL HIGH (ref 4.8–5.6)

## 2022-08-16 NOTE — Progress Notes (Signed)
Subjective:  [name],  female    DOB: 05-Oct-1945  Age: 77 y.o.    CC: Medical Management of Chronic Issues   HPI Aoibheann Heesch presents for  follow-up of hypertension. Patient has no history of headache chest pain or shortness of breath or recent cough. Patient also denies symptoms of TIA such as numbness weakness lateralizing. Patient denies side effects from medication. States taking it regularly.  Patient also  in for follow-up of elevated cholesterol. Doing well without complaints on current medication. Denies side effects  including myalgia and arthralgia and nausea. Also in today for liver function testing. Currently no chest pain, shortness of breath or other cardiovascular related symptoms noted.  Follow-up of diabetes. Patient does check blood sugar at home. Readings run between 100 and 130 fasting Patient denies symptoms such as excessive hunger or urinary frequency, excessive hunger, nausea No significant hypoglycemic spells noted. Medications reviewed. Pt reports taking them regularly. Pt. denies complication/adverse reaction today.    History Khamari has a past medical history of Anemia, Chronic kidney disease, and Diabetes mellitus without complication (HCC).   She has a past surgical history that includes jejunostomy (05/17/2008); Gastric bypass (11/2001); and Vaginal hysterectomy (12/1986).   Her family history includes Breast cancer in her maternal aunt and mother; Cancer in her father and mother.She reports that she has never smoked. She has never used smokeless tobacco. She reports that she does not drink alcohol and does not use drugs.  Current Outpatient Medications on File Prior to Visit  Medication Sig Dispense Refill   apixaban (ELIQUIS) 5 MG TABS tablet Take 5 mg by mouth 2 (two) times daily.     atorvastatin (LIPITOR) 40 MG tablet Take 1 tablet (40 mg total) by mouth daily. 90 tablet 3   Blood Glucose Monitoring Suppl (ACCU-CHEK AVIVA PLUS)  w/Device KIT Test BS BID Dx E11.21 1 kit PRN   cyanocobalamin (,VITAMIN B-12,) 1000 MCG/ML injection Inject 1 mL (1,000 mcg total) into the skin every 30 (thirty) days. 10 mL prn   Cyanocobalamin 1000 MCG/ML KIT Give 1 ml from one vial and 0.2 ml from second vial monthly 4 kit 3   glucose blood (ACCU-CHEK AVIVA PLUS) test strip Test BS BID Dx E11.21 200 each 3   Lancets (ACCU-CHEK MULTICLIX) lancets Test BS BID Dx E11.21 200 each 3   levothyroxine (EUTHYROX) 125 MCG tablet TAKE 1 TABLET BY MOUTH IN THE MORNING 30  MINUTES  BEFORE  MEAL 90 tablet 3   lisinopril (ZESTRIL) 20 MG tablet Take 1 tablet (20 mg total) by mouth 2 (two) times daily. 180 tablet 3   Multiple Minerals-Vitamins (CALCIUM-MAGNESIUM-ZINC-D3 PO) Take by mouth 3 (three) times daily.     omeprazole (PRILOSEC) 40 MG capsule Take 1 capsule (40 mg total) by mouth daily. 90 capsule 3   Pediatric Multiple Vit-C-FA (FLINSTONES GUMMIES OMEGA-3 DHA) CHEW Chew by mouth.     predniSONE (DELTASONE) 1 MG tablet Take 4 tablets (4 mg total) by mouth daily. 360 tablet 3   raloxifene (EVISTA) 60 MG tablet Take 1 tablet (60 mg total) by mouth daily. 90 tablet 3   vitamin C (ASCORBIC ACID) 500 MG tablet Take 500 mg by mouth daily.     Current Facility-Administered Medications on File Prior to Visit  Medication Dose Route Frequency Provider Last Rate Last Admin   cyanocobalamin ((VITAMIN B-12)) injection 1,000 mcg  1,000 mcg Intramuscular Q30 days Mechele Claude, MD   1,000 mcg at 05/17/22 1408  ROS Review of Systems  Constitutional: Negative.   HENT: Negative.    Eyes:  Negative for visual disturbance.  Respiratory:  Negative for shortness of breath.   Cardiovascular:  Negative for chest pain.  Gastrointestinal:  Negative for abdominal pain.  Musculoskeletal:  Negative for arthralgias.    Objective:  BP 120/70   Pulse 85   Temp (!) 97.3 F (36.3 C)   Ht 5\' 4"  (1.626 m)   Wt 164 lb 3.2 oz (74.5 kg)   LMP 04/11/1986   SpO2 98%    BMI 28.18 kg/m   BP Readings from Last 3 Encounters:  08/16/22 120/70  07/20/22 109/73  06/08/22 122/74    Wt Readings from Last 3 Encounters:  08/16/22 164 lb 3.2 oz (74.5 kg)  07/20/22 169 lb 9.6 oz (76.9 kg)  06/08/22 180 lb 6.4 oz (81.8 kg)     Physical Exam Constitutional:      General: She is not in acute distress.    Appearance: She is well-developed.  Cardiovascular:     Rate and Rhythm: Normal rate and regular rhythm.  Pulmonary:     Breath sounds: Normal breath sounds.  Musculoskeletal:        General: Normal range of motion.  Skin:    General: Skin is warm and dry.  Neurological:     Mental Status: She is alert and oriented to person, place, and time.     Diabetic Foot Exam - Simple   Simple Foot Form Diabetic Foot exam was performed with the following findings: Yes 08/16/2022  2:10 PM  Visual Inspection No deformities, no ulcerations, no other skin breakdown bilaterally: Yes Sensation Testing Intact to touch and monofilament testing bilaterally: Yes Pulse Check Posterior Tibialis and Dorsalis pulse intact bilaterally: Yes Comments     Lab Results  Component Value Date   HGBA1C 6.5 (H) 05/17/2022   HGBA1C 6.3 (H) 02/09/2022   HGBA1C 6.1 (H) 11/09/2021    Assessment & Plan:   Juniata was seen today for medical management of chronic issues.  Diagnoses and all orders for this visit:  Mixed hyperlipidemia -     Lipid panel  Essential (primary) hypertension -     CBC with Differential/Platelet -     CMP14+EGFR  Iron deficiency anemia, unspecified iron deficiency anemia type -     Iron, TIBC and Ferritin Panel  Hypothyroidism, unspecified type -     TSH + free T4  Type 2 diabetes mellitus with stage 3 chronic kidney disease, without long-term current use of insulin, unspecified whether stage 3a or 3b CKD (HCC) -     Bayer DCA Hb A1c Waived   I am having [name] maintain her Multiple Minerals-Vitamins (CALCIUM-MAGNESIUM-ZINC-D3 PO),  Flinstones Gummies Omega-3 DHA, ascorbic acid, Cyanocobalamin, cyanocobalamin, Accu-Chek Aviva Plus, Accu-Chek Aviva Plus, accu-chek multiclix, atorvastatin, levothyroxine, lisinopril, omeprazole, predniSONE, raloxifene, and apixaban. We will continue to administer cyanocobalamin.  No orders of the defined types were placed in this encounter.    Follow-up: No follow-ups on file.  Mechele Claude, M.D.

## 2022-08-17 LAB — CMP14+EGFR
ALT: 7 IU/L (ref 0–32)
AST: 13 IU/L (ref 0–40)
Albumin/Globulin Ratio: 1.7 (ref 1.2–2.2)
Albumin: 3.7 g/dL — ABNORMAL LOW (ref 3.8–4.8)
Alkaline Phosphatase: 53 IU/L (ref 44–121)
BUN/Creatinine Ratio: 23 (ref 12–28)
BUN: 29 mg/dL — ABNORMAL HIGH (ref 8–27)
Bilirubin Total: 0.3 mg/dL (ref 0.0–1.2)
CO2: 18 mmol/L — ABNORMAL LOW (ref 20–29)
Calcium: 8.9 mg/dL (ref 8.7–10.3)
Chloride: 110 mmol/L — ABNORMAL HIGH (ref 96–106)
Creatinine, Ser: 1.28 mg/dL — ABNORMAL HIGH (ref 0.57–1.00)
Globulin, Total: 2.2 g/dL (ref 1.5–4.5)
Glucose: 134 mg/dL — ABNORMAL HIGH (ref 70–99)
Potassium: 4.2 mmol/L (ref 3.5–5.2)
Sodium: 145 mmol/L — ABNORMAL HIGH (ref 134–144)
Total Protein: 5.9 g/dL — ABNORMAL LOW (ref 6.0–8.5)
eGFR: 43 mL/min/{1.73_m2} — ABNORMAL LOW (ref >59)

## 2022-08-17 LAB — TSH+FREE T4
Free T4: 1.83 ng/dL — ABNORMAL HIGH (ref 0.82–1.77)
TSH: 0.094 u[IU]/mL — ABNORMAL LOW (ref 0.450–4.500)

## 2022-08-17 LAB — LIPID PANEL
Chol/HDL Ratio: 3.6 ratio (ref 0.0–4.4)
Cholesterol, Total: 178 mg/dL (ref 100–199)
HDL: 49 mg/dL (ref >39)
LDL Chol Calc (NIH): 111 mg/dL — ABNORMAL HIGH (ref 0–99)
Triglycerides: 100 mg/dL (ref 0–149)
VLDL Cholesterol Cal: 18 mg/dL (ref 5–40)

## 2022-08-17 LAB — CBC WITH DIFFERENTIAL/PLATELET
Basophils Absolute: 0 10*3/uL (ref 0.0–0.2)
Basos: 1 %
EOS (ABSOLUTE): 0.1 10*3/uL (ref 0.0–0.4)
Eos: 1 %
Hematocrit: 27.9 % — ABNORMAL LOW (ref 34.0–46.6)
Hemoglobin: 8.7 g/dL — CL (ref 11.1–15.9)
Immature Grans (Abs): 0 10*3/uL (ref 0.0–0.1)
Immature Granulocytes: 0 %
Lymphocytes Absolute: 0.8 10*3/uL (ref 0.7–3.1)
Lymphs: 12 %
MCH: 28.3 pg (ref 26.6–33.0)
MCHC: 31.2 g/dL — ABNORMAL LOW (ref 31.5–35.7)
MCV: 91 fL (ref 79–97)
Monocytes Absolute: 0.5 10*3/uL (ref 0.1–0.9)
Monocytes: 7 %
Neutrophils Absolute: 5.5 10*3/uL (ref 1.4–7.0)
Neutrophils: 79 %
Platelets: 214 10*3/uL (ref 150–450)
RBC: 3.07 x10E6/uL — ABNORMAL LOW (ref 3.77–5.28)
RDW: 15.6 % — ABNORMAL HIGH (ref 11.7–15.4)
WBC: 7 10*3/uL (ref 3.4–10.8)

## 2022-08-17 LAB — IRON,TIBC AND FERRITIN PANEL
Ferritin: 308 ng/mL — ABNORMAL HIGH (ref 15–150)
Iron Saturation: 22 % (ref 15–55)
Iron: 49 ug/dL (ref 27–139)
Total Iron Binding Capacity: 220 ug/dL — ABNORMAL LOW (ref 250–450)
UIBC: 171 ug/dL (ref 118–369)

## 2022-09-03 DIAGNOSIS — Z888 Allergy status to other drugs, medicaments and biological substances status: Secondary | ICD-10-CM | POA: Diagnosis not present

## 2022-09-03 DIAGNOSIS — S31109A Unspecified open wound of abdominal wall, unspecified quadrant without penetration into peritoneal cavity, initial encounter: Secondary | ICD-10-CM | POA: Diagnosis not present

## 2022-09-03 DIAGNOSIS — T8149XA Infection following a procedure, other surgical site, initial encounter: Secondary | ICD-10-CM | POA: Diagnosis not present

## 2022-09-03 DIAGNOSIS — Z79899 Other long term (current) drug therapy: Secondary | ICD-10-CM | POA: Diagnosis not present

## 2022-09-03 DIAGNOSIS — I10 Essential (primary) hypertension: Secondary | ICD-10-CM | POA: Diagnosis not present

## 2022-09-03 DIAGNOSIS — Z7901 Long term (current) use of anticoagulants: Secondary | ICD-10-CM | POA: Diagnosis not present

## 2022-09-16 DIAGNOSIS — H2511 Age-related nuclear cataract, right eye: Secondary | ICD-10-CM | POA: Diagnosis not present

## 2022-09-16 DIAGNOSIS — H43813 Vitreous degeneration, bilateral: Secondary | ICD-10-CM | POA: Diagnosis not present

## 2022-09-16 DIAGNOSIS — H1131 Conjunctival hemorrhage, right eye: Secondary | ICD-10-CM | POA: Diagnosis not present

## 2022-09-16 DIAGNOSIS — H527 Unspecified disorder of refraction: Secondary | ICD-10-CM | POA: Diagnosis not present

## 2022-09-16 DIAGNOSIS — H33313 Horseshoe tear of retina without detachment, bilateral: Secondary | ICD-10-CM | POA: Diagnosis not present

## 2022-09-16 DIAGNOSIS — E119 Type 2 diabetes mellitus without complications: Secondary | ICD-10-CM | POA: Diagnosis not present

## 2022-09-16 DIAGNOSIS — H18593 Other hereditary corneal dystrophies, bilateral: Secondary | ICD-10-CM | POA: Diagnosis not present

## 2022-09-16 DIAGNOSIS — H25812 Combined forms of age-related cataract, left eye: Secondary | ICD-10-CM | POA: Diagnosis not present

## 2022-09-16 LAB — HM DIABETES EYE EXAM

## 2022-09-21 ENCOUNTER — Ambulatory Visit (INDEPENDENT_AMBULATORY_CARE_PROVIDER_SITE_OTHER): Payer: Medicare PPO | Admitting: *Deleted

## 2022-09-21 DIAGNOSIS — E538 Deficiency of other specified B group vitamins: Secondary | ICD-10-CM | POA: Diagnosis not present

## 2022-09-21 NOTE — Progress Notes (Signed)
Vitamin b12 injection given and tolerated well.  

## 2022-09-27 DIAGNOSIS — Z4889 Encounter for other specified surgical aftercare: Secondary | ICD-10-CM | POA: Diagnosis not present

## 2022-10-10 DIAGNOSIS — Z5189 Encounter for other specified aftercare: Secondary | ICD-10-CM | POA: Diagnosis not present

## 2022-10-12 DIAGNOSIS — N1330 Unspecified hydronephrosis: Secondary | ICD-10-CM | POA: Diagnosis not present

## 2022-10-14 DIAGNOSIS — L579 Skin changes due to chronic exposure to nonionizing radiation, unspecified: Secondary | ICD-10-CM | POA: Diagnosis not present

## 2022-10-14 DIAGNOSIS — L821 Other seborrheic keratosis: Secondary | ICD-10-CM | POA: Diagnosis not present

## 2022-10-14 DIAGNOSIS — L570 Actinic keratosis: Secondary | ICD-10-CM | POA: Diagnosis not present

## 2022-10-14 DIAGNOSIS — L814 Other melanin hyperpigmentation: Secondary | ICD-10-CM | POA: Diagnosis not present

## 2022-10-14 DIAGNOSIS — Z85828 Personal history of other malignant neoplasm of skin: Secondary | ICD-10-CM | POA: Diagnosis not present

## 2022-10-21 ENCOUNTER — Ambulatory Visit (INDEPENDENT_AMBULATORY_CARE_PROVIDER_SITE_OTHER): Payer: Medicare PPO

## 2022-10-21 DIAGNOSIS — E538 Deficiency of other specified B group vitamins: Secondary | ICD-10-CM

## 2022-10-21 NOTE — Progress Notes (Signed)
B12 injection given right deltoid patient tolerated well

## 2022-10-27 ENCOUNTER — Encounter: Payer: Self-pay | Admitting: Family Medicine

## 2022-10-27 ENCOUNTER — Ambulatory Visit: Payer: Medicare PPO | Admitting: Family Medicine

## 2022-10-27 VITALS — BP 117/72 | HR 88 | Temp 97.1°F | Ht 64.0 in | Wt 160.0 lb

## 2022-10-27 DIAGNOSIS — L659 Nonscarring hair loss, unspecified: Secondary | ICD-10-CM | POA: Diagnosis not present

## 2022-10-27 DIAGNOSIS — D509 Iron deficiency anemia, unspecified: Secondary | ICD-10-CM

## 2022-10-27 DIAGNOSIS — E039 Hypothyroidism, unspecified: Secondary | ICD-10-CM | POA: Diagnosis not present

## 2022-10-27 DIAGNOSIS — K439 Ventral hernia without obstruction or gangrene: Secondary | ICD-10-CM

## 2022-10-27 NOTE — Progress Notes (Unsigned)
Subjective:  [name], female    DOB: December 06, 1945  Age: 77 y.o. MRN: [id number]  CC: Alopecia   HPI Erin Ortiz presents for concern for hair loss. Coming out in clumps for the last month.  Pt. Also moving soon to [facility] with her husband to live near her daughter.    follow-up on  thyroid. The patient has a history of hypothyroidism for many years. It has been stable recently. Pt. denies any change in  voice, loss of hair, heat or cold intolerance. Energy level has been adequate to good. Patient denies constipation and diarrhea. No myxedema. Medication is as noted below. Verified that pt is taking it daily on an empty stomach. Well tolerated.      08/16/2022    1:49 PM 07/20/2022    3:54 PM 06/08/2022    9:09 AM  Depression screen PHQ 2/9  Decreased Interest 0 0 0  Down, Depressed, Hopeless 0 0 0  PHQ - 2 Score 0 0 0    History Erin Ortiz has a past medical history of Anemia, Chronic kidney disease, and Diabetes mellitus without complication (HCC).   She has a past surgical history that includes jejunostomy (05/17/2008); Gastric bypass (11/2001); and Vaginal hysterectomy (12/1986).   Her family history includes Breast cancer in her maternal aunt and mother; Cancer in her father and mother.She reports that she has never smoked. She has never used smokeless tobacco. She reports that she does not drink alcohol and does not use drugs.    ROS Review of Systems  Constitutional: Negative.   HENT: Negative.    Eyes:  Negative for visual disturbance.  Respiratory:  Negative for shortness of breath.   Cardiovascular:  Negative for chest pain.  Gastrointestinal:  Negative for abdominal pain.  Musculoskeletal:  Negative for arthralgias.    Objective:  BP 117/72   Pulse 88   Temp (!) 97.1 F (36.2 C)   Ht 5\' 4"  (1.626 m)   Wt 160 lb (72.6 kg)   LMP 04/11/1986   SpO2 98%   BMI 27.46 kg/m   BP Readings from Last 3 Encounters:  10/27/22 117/72  08/16/22  120/70  07/20/22 109/73    Wt Readings from Last 3 Encounters:  10/27/22 160 lb (72.6 kg)  08/16/22 164 lb 3.2 oz (74.5 kg)  07/20/22 169 lb 9.6 oz (76.9 kg)     Physical Exam Constitutional:      General: She is not in acute distress.    Appearance: She is well-developed.  Cardiovascular:     Rate and Rhythm: Normal rate and regular rhythm.  Pulmonary:     Breath sounds: Normal breath sounds.  Musculoskeletal:        General: Normal range of motion.  Skin:    General: Skin is warm and dry.     Comments: No patches of alopecia noted, but hair is a little thin overall  Neurological:     Mental Status: She is alert and oriented to person, place, and time.       Assessment & Plan:   Erin Ortiz was seen today for alopecia.  Diagnoses and all orders for this visit:  Hypothyroidism, unspecified type -     TSH + free T4 -     CBC with Differential/Platelet  Iron deficiency anemia, unspecified iron deficiency anemia type -     TSH + free T4 -     CBC with Differential/Platelet  Ventral hernia without obstruction or gangrene  Alopecia  I am having Erin Ortiz maintain her Multiple Minerals-Vitamins (CALCIUM-MAGNESIUM-ZINC-D3 PO), Flinstones Gummies Omega-3 DHA, ascorbic acid, Cyanocobalamin, cyanocobalamin, Accu-Chek Aviva Plus, Accu-Chek Aviva Plus, accu-chek multiclix, atorvastatin, levothyroxine, lisinopril, omeprazole, predniSONE, raloxifene, and apixaban. We will continue to administer cyanocobalamin.  Allergies as of 10/27/2022       Reactions   Januvia [sitagliptin] Nausea Only        Medication List        Accurate as of October 27, 2022 11:59 PM. If you have any questions, ask your nurse or doctor.          Accu-Chek Aviva Plus test strip Generic drug: glucose blood Test BS BID Dx E11.21   Accu-Chek Aviva Plus w/Device Kit Test BS BID Dx E11.21   accu-chek multiclix lancets Test BS BID Dx E11.21   ascorbic acid 500 MG tablet Commonly  known as: VITAMIN C Take 500 mg by mouth daily.   atorvastatin 40 MG tablet Commonly known as: LIPITOR Take 1 tablet (40 mg total) by mouth daily.   CALCIUM-MAGNESIUM-ZINC-D3 PO Take by mouth 3 (three) times daily.   Cyanocobalamin 1000 MCG/ML Kit Give 1 ml from one vial and 0.2 ml from second vial monthly   cyanocobalamin 1000 MCG/ML injection Commonly known as: VITAMIN B12 Inject 1 mL (1,000 mcg total) into the skin every 30 (thirty) days.   Eliquis 5 MG Tabs tablet Generic drug: apixaban Take 5 mg by mouth 2 (two) times daily.   Flinstones Gummies Omega-3 DHA Chew Chew by mouth.   levothyroxine 125 MCG tablet Commonly known as: Euthyrox TAKE 1 TABLET BY MOUTH IN THE MORNING 30  MINUTES  BEFORE  MEAL   lisinopril 20 MG tablet Commonly known as: ZESTRIL Take 1 tablet (20 mg total) by mouth 2 (two) times daily.   omeprazole 40 MG capsule Commonly known as: PRILOSEC Take 1 capsule (40 mg total) by mouth daily.   predniSONE 1 MG tablet Commonly known as: DELTASONE Take 4 tablets (4 mg total) by mouth daily.   raloxifene 60 MG tablet Commonly known as: EVISTA Take 1 tablet (60 mg total) by mouth daily.         Follow-up: Return in about 6 weeks (around 12/08/2022).  Mechele Claude, M.D.

## 2022-10-28 LAB — CBC WITH DIFFERENTIAL/PLATELET
Basophils Absolute: 0 10*3/uL (ref 0.0–0.2)
Basos: 1 %
EOS (ABSOLUTE): 0.1 10*3/uL (ref 0.0–0.4)
Eos: 1 %
Hematocrit: 32.2 % — ABNORMAL LOW (ref 34.0–46.6)
Hemoglobin: 10.3 g/dL — ABNORMAL LOW (ref 11.1–15.9)
Immature Grans (Abs): 0 10*3/uL (ref 0.0–0.1)
Immature Granulocytes: 0 %
Lymphocytes Absolute: 1 10*3/uL (ref 0.7–3.1)
Lymphs: 14 %
MCH: 28.2 pg (ref 26.6–33.0)
MCHC: 32 g/dL (ref 31.5–35.7)
MCV: 88 fL (ref 79–97)
Monocytes Absolute: 0.5 10*3/uL (ref 0.1–0.9)
Monocytes: 7 %
Neutrophils Absolute: 5.8 10*3/uL (ref 1.4–7.0)
Neutrophils: 77 %
Platelets: 227 10*3/uL (ref 150–450)
RBC: 3.65 x10E6/uL — ABNORMAL LOW (ref 3.77–5.28)
RDW: 13.7 % (ref 11.7–15.4)
WBC: 7.5 10*3/uL (ref 3.4–10.8)

## 2022-10-28 LAB — TSH+FREE T4
Free T4: 1.28 ng/dL (ref 0.82–1.77)
TSH: 0.641 u[IU]/mL (ref 0.450–4.500)

## 2022-10-30 ENCOUNTER — Encounter: Payer: Self-pay | Admitting: Family Medicine

## 2022-10-30 NOTE — Progress Notes (Addendum)
Hello Symphany,  Your lab result is normal and/or stable.Some minor variations that are not significant are commonly marked abnormal, but do not represent any medical problem for you.  Best regards, Warren Stacks, M.D.

## 2022-11-08 DIAGNOSIS — Z5189 Encounter for other specified aftercare: Secondary | ICD-10-CM | POA: Diagnosis not present

## 2022-11-16 ENCOUNTER — Ambulatory Visit: Payer: Medicare PPO | Admitting: Family Medicine

## 2022-11-16 ENCOUNTER — Encounter: Payer: Self-pay | Admitting: Family Medicine

## 2022-11-16 VITALS — BP 126/79 | HR 87 | Temp 98.0°F | Ht 64.0 in | Wt 159.2 lb

## 2022-11-16 DIAGNOSIS — E039 Hypothyroidism, unspecified: Secondary | ICD-10-CM

## 2022-11-16 DIAGNOSIS — E538 Deficiency of other specified B group vitamins: Secondary | ICD-10-CM

## 2022-11-16 DIAGNOSIS — E1122 Type 2 diabetes mellitus with diabetic chronic kidney disease: Secondary | ICD-10-CM

## 2022-11-16 DIAGNOSIS — D509 Iron deficiency anemia, unspecified: Secondary | ICD-10-CM

## 2022-11-16 DIAGNOSIS — E782 Mixed hyperlipidemia: Secondary | ICD-10-CM

## 2022-11-16 DIAGNOSIS — I10 Essential (primary) hypertension: Secondary | ICD-10-CM

## 2022-11-16 DIAGNOSIS — N183 Chronic kidney disease, stage 3 unspecified: Secondary | ICD-10-CM | POA: Diagnosis not present

## 2022-11-16 LAB — BAYER DCA HB A1C WAIVED: HB A1C (BAYER DCA - WAIVED): 6.6 % — ABNORMAL HIGH (ref 4.8–5.6)

## 2022-11-16 MED ORDER — OMEPRAZOLE 40 MG PO CPDR: 40.0000 mg | DELAYED_RELEASE_CAPSULE | Freq: Every day | ORAL | 3 refills | Status: AC

## 2022-11-16 MED ORDER — ACCU-CHEK AVIVA PLUS VI STRP: ORAL_STRIP | 3 refills | Status: AC

## 2022-11-16 MED ORDER — LEVOTHYROXINE SODIUM 125 MCG PO TABS: ORAL_TABLET | ORAL | 3 refills | Status: AC

## 2022-11-16 NOTE — Progress Notes (Signed)
Subjective:  [name],  female    DOB: 02-19-1946  Age: 77 y.o.    CC: Medical Management of Chronic Issues   HPI Erin Ortiz presents for  follow-up of hypertension. Patient has no history of headache chest pain or shortness of breath or recent cough. Patient also denies symptoms of TIA such as numbness weakness lateralizing. Patient denies side effects from medication. States taking it regularly.  Patient also  in for follow-up of elevated cholesterol. Doing well without complaints on current medication. Denies side effects  including myalgia and arthralgia and nausea. Also in today for liver function testing. Currently no chest pain, shortness of breath or other cardiovascular related symptoms noted.  Follow-up of diabetes. Patient does check blood sugar at home. No highs or lows.  Patient denies symptoms such as excessive hunger or urinary frequency, excessive hunger, nausea No significant hypoglycemic spells noted.Has CRF stage 3. 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 Pt reports taking them regularly. Pt. denies complication/adverse reaction today.   Thyroid check not due at this time. Denies sx related to that.   Moving with her husband to [facility] on August 21, to live with her daughter. Scrips trnasferred today.  History Erin Ortiz has a past medical history of Anemia, Chronic kidney disease, and Diabetes mellitus without complication (HCC).   Erin Ortiz has a past surgical history that includes jejunostomy (05/17/2008); Gastric bypass (11/2001); and Vaginal hysterectomy (12/1986).   Her family history includes Breast cancer in her maternal aunt and mother; Cancer in her father and mother.Erin Ortiz reports that Erin Ortiz has never smoked. Erin Ortiz has never used smokeless tobacco. Erin Ortiz reports that Erin Ortiz does not drink alcohol and does not use drugs.  Current Outpatient Medications on File Prior to Visit  Medication Sig Dispense Refill    apixaban (ELIQUIS) 5 MG TABS tablet Take 5 mg by mouth 2 (two) times daily.     atorvastatin (LIPITOR) 40 MG tablet Take 1 tablet (40 mg total) by mouth daily. 90 tablet 3   Blood Glucose Monitoring Suppl (ACCU-CHEK AVIVA PLUS) w/Device KIT Test BS BID Dx E11.21 1 kit PRN   cyanocobalamin (,VITAMIN B-12,) 1000 MCG/ML injection Inject 1 mL (1,000 mcg total) into the skin every 30 (thirty) days. 10 mL prn   Cyanocobalamin 1000 MCG/ML KIT Give 1 ml from one vial and 0.2 ml from second vial monthly 4 kit 3   Lancets (ACCU-CHEK MULTICLIX) lancets Test BS BID Dx E11.21 200 each 3   lisinopril (ZESTRIL) 20 MG tablet Take 1 tablet (20 mg total) by mouth 2 (two) times daily. 180 tablet 3   Multiple Minerals-Vitamins (CALCIUM-MAGNESIUM-ZINC-D3 PO) Take by mouth 3 (three) times daily.     Pediatric Multiple Vit-C-FA (FLINSTONES GUMMIES OMEGA-3 DHA) CHEW Chew by mouth.     predniSONE (DELTASONE) 1 MG tablet Take 4 tablets (4 mg total) by mouth daily. 360 tablet 3   raloxifene (EVISTA) 60 MG tablet Take 1 tablet (60 mg total) by mouth daily. 90 tablet 3   vitamin C (ASCORBIC ACID) 500 MG tablet Take 500 mg by mouth daily.     Current Facility-Administered Medications on File Prior to Visit  Medication Dose Route Frequency Provider Last Rate Last Admin   cyanocobalamin ((VITAMIN B-12)) injection 1,000 mcg  1,000 mcg Intramuscular Q30 days Mechele Claude, MD   1,000 mcg at 10/21/22 1137    ROS Review of Systems  Constitutional: Negative.   HENT: Negative.    Eyes:  Negative  for visual disturbance.  Respiratory:  Negative for shortness of breath.   Cardiovascular:  Negative for chest pain.  Gastrointestinal:  Negative for abdominal pain.  Musculoskeletal:  Negative for arthralgias.    Objective:  BP 126/79   Pulse 87   Temp 98 F (36.7 C)   Ht 5\' 4"  (1.626 m)   Wt 159 lb 3.2 oz (72.2 kg)   LMP 04/11/1986   SpO2 97%   BMI 27.33 kg/m   BP Readings from Last 3 Encounters:  11/16/22 126/79   10/27/22 117/72  08/16/22 120/70    Wt Readings from Last 3 Encounters:  11/16/22 159 lb 3.2 oz (72.2 kg)  10/27/22 160 lb (72.6 kg)  08/16/22 164 lb 3.2 oz (74.5 kg)     Physical Exam Constitutional:      General: Erin Ortiz is not in acute distress.    Appearance: Erin Ortiz is well-developed.  Cardiovascular:     Rate and Rhythm: Normal rate and regular rhythm.  Pulmonary:     Breath sounds: Normal breath sounds.  Musculoskeletal:        General: Normal range of motion.  Skin:    General: Skin is warm and dry.  Neurological:     Mental Status: Erin Ortiz is alert and oriented to person, place, and time.     Diabetic Foot Exam - Simple   No data filed     Lab Results  Component Value Date   HGBA1C 6.6 (H) 11/16/2022   HGBA1C 6.1 (H) 08/16/2022   HGBA1C 6.5 (H) 05/17/2022    Assessment & Plan:   Erin Ortiz was seen today for medical management of chronic issues.  Diagnoses and all orders for this visit:  Hypothyroidism, unspecified type -     TSH + free T4  Iron deficiency anemia, unspecified iron deficiency anemia type -     Iron, TIBC and Ferritin Panel  Mixed hyperlipidemia -     Lipid panel  Essential (primary) hypertension -     CBC with Differential/Platelet -     CMP14+EGFR  Type 2 diabetes mellitus with stage 3 chronic kidney disease, without long-term current use of insulin, unspecified whether stage 3a or 3b CKD (HCC) -     Bayer DCA Hb A1c Waived  Other orders -     glucose blood (ACCU-CHEK AVIVA PLUS) test strip; Test BS BID Dx E11.21 -     levothyroxine (EUTHYROX) 125 MCG tablet; TAKE 1 TABLET BY MOUTH IN THE MORNING 30  MINUTES  BEFORE  MEAL -     omeprazole (PRILOSEC) 40 MG capsule; Take 1 capsule (40 mg total) by mouth daily. On an empty stomach.   I have changed Erin Ortiz's omeprazole. I am also having her maintain her Multiple Minerals-Vitamins (CALCIUM-MAGNESIUM-ZINC-D3 PO), Flinstones Gummies Omega-3 DHA, ascorbic acid, Cyanocobalamin,  cyanocobalamin, Accu-Chek Aviva Plus, accu-chek multiclix, atorvastatin, lisinopril, predniSONE, raloxifene, apixaban, Accu-Chek Aviva Plus, and levothyroxine. We will continue to administer cyanocobalamin.  Meds ordered this encounter  Medications   glucose blood (ACCU-CHEK AVIVA PLUS) test strip    Sig: Test BS BID Dx E11.21    Dispense:  200 each    Refill:  3   levothyroxine (EUTHYROX) 125 MCG tablet    Sig: TAKE 1 TABLET BY MOUTH IN THE MORNING 30  MINUTES  BEFORE  MEAL    Dispense:  90 tablet    Refill:  3   omeprazole (PRILOSEC) 40 MG capsule    Sig: Take 1 capsule (40 mg total) by mouth daily. On  an empty stomach.    Dispense:  90 capsule    Refill:  3     Follow-up: Return if symptoms worsen or fail to improve.  Mechele Claude, M.D.

## 2022-11-17 ENCOUNTER — Other Ambulatory Visit: Payer: Self-pay

## 2022-11-17 DIAGNOSIS — N289 Disorder of kidney and ureter, unspecified: Secondary | ICD-10-CM

## 2022-11-24 ENCOUNTER — Other Ambulatory Visit: Payer: Medicare PPO

## 2022-12-14 ENCOUNTER — Telehealth: Payer: Self-pay

## 2022-12-14 NOTE — Transitions of Care (Post Inpatient/ED Visit) (Signed)
   12/14/2022  Name: Erin Ortiz MRN: [id number] DOB: 03-24-1946  Today's TOC FU Call Status: Today's TOC FU Call Status:: Unsuccessful Call (1st Attempt) Unsuccessful Call (1st Attempt) Date: 12/14/22  Attempted to reach the patient regarding the most recent Inpatient/ED visit.  Follow Up Plan: Additional outreach attempts will be made to reach the patient to complete the Transitions of Care (Post Inpatient/ED visit) call.   Jodelle Gross RN, BSN, CCM [facility] RN Care Coordinator/ Transitions of Care Direct Dial: [phone]  Fax: [phone]

## 2022-12-15 ENCOUNTER — Telehealth: Payer: Self-pay

## 2022-12-15 NOTE — Transitions of Care (Post Inpatient/ED Visit) (Signed)
12/15/2022  Name: Erin Ortiz MRN: [id number] DOB: Jun 24, 1945  Today's TOC FU Call Status: Today's TOC FU Call Status:: Successful TOC FU Call Completed TOC FU Call Complete Date: 12/15/22 Patient's Name and Date of Birth confirmed.  Transition Care Management Follow-up Telephone Call Date of Discharge: 12/12/22 Discharge Facility: Other [facility]) Name of Other (Non[facility]) Discharge Facility: [facility] Type of Discharge: Inpatient Admission Primary Inpatient Discharge Diagnosis:: Small Bowel Obstruction How have you been since you were released from the hospital?: Better Any questions or concerns?: No  Items Reviewed: Did you receive and understand the discharge instructions provided?: Yes Medications obtained,verified, and reconciled?: No Medications Not Reviewed Reasons:: Other: (Patient did not have time to review) Any new allergies since your discharge?: No Dietary orders reviewed?: No (Patient notes she is eating and drinking as normal) Do you have support at home?: Yes People in Home: spouse Name of Support/Comfort Primary Source: Adela Lank  Medications Reviewed Today: Medications Reviewed Today     Reviewed by Jodelle Gross, RN (Case Manager) on 12/15/22 at 1115  Med List Status: <None>   Medication Order Taking? Sig Documenting Provider Last Dose Status Informant  apixaban (ELIQUIS) 5 MG TABS tablet [id number]  Take 5 mg by mouth 2 (two) times daily. [provider]  Active            Med Note Electa Sniff, [facility]   Thu Dec 15, 2022 11:11 AM) TOC did not review medications with patient  atorvastatin (LIPITOR) 40 MG tablet [id number]  Take 1 tablet (40 mg total) by mouth daily. Mechele Claude, MD  Active   Blood Glucose Monitoring Suppl (ACCU-CHEK AVIVA PLUS) w/Device Andria Rhein [id number]  Test BS BID Dx E11.21 Mechele Claude, MD  Active   cyanocobalamin ((VITAMIN B-12)) injection 1,000 mcg [id number]   Mechele Claude, MD  Active    cyanocobalamin (,VITAMIN B-12,) 1000 MCG/ML injection [id number]  Inject 1 mL (1,000 mcg total) into the skin every 30 (thirty) days. Mechele Claude, MD  Active   Cyanocobalamin 1000 MCG/ML KIT [id number]  Give 1 ml from one vial and 0.2 ml from second vial monthly Mechele Claude, MD  Active   glucose blood (ACCU-CHEK AVIVA PLUS) test strip [id number]  Test BS BID Dx E11.21 Mechele Claude, MD  Active   Lancets (ACCU-CHEK MULTICLIX) lancets [id number]  Test BS BID Dx E11.21 Mechele Claude, MD  Active   levothyroxine (EUTHYROX) 125 MCG tablet [id number]  TAKE 1 TABLET BY MOUTH IN THE MORNING 30  MINUTES  BEFORE  MEAL Mechele Claude, MD  Active   lisinopril (ZESTRIL) 20 MG tablet [id number]  Take 1 tablet (20 mg total) by mouth 2 (two) times daily. Mechele Claude, MD  Active   Multiple Minerals-Vitamins (CALCIUM-MAGNESIUM-ZINC-D3 PO) [id number]  Take by mouth 3 (three) times daily. [provider]  Active   omeprazole (PRILOSEC) 40 MG capsule [id number]  Take 1 capsule (40 mg total) by mouth daily. On an empty stomach. Mechele Claude, MD  Active   Pediatric Multiple Vit-C-FA Asencion Islam GUMMIES OMEGA-3 [facility]) CHEW [id number]  Chew by mouth. [provider]  Active   predniSONE (DELTASONE) 1 MG tablet [id number]  Take 4 tablets (4 mg total) by mouth daily. Mechele Claude, MD  Active   raloxifene (EVISTA) 60 MG tablet [id number]  Take 1 tablet (60 mg total) by mouth daily. Mechele Claude, MD  Active   vitamin C (ASCORBIC ACID) 500 MG tablet [id number]  Take 500 mg by mouth daily. [provider]  Active  Home Care and Equipment/Supplies: Were Home Health Services Ordered?: No Any new equipment or medical supplies ordered?: No  Functional Questionnaire: Do you need assistance with bathing/showering or dressing?: No Do you need assistance with meal preparation?: No Do you need assistance with eating?: No Do you have difficulty maintaining continence: No Do you need  assistance with getting out of bed/getting out of a chair/moving?: No Do you have difficulty managing or taking your medications?: No  Follow up appointments reviewed: PCP Follow-up appointment confirmed?: No MD Provider Line [phone] Given: No (Paitent to call for follow up) Specialist Hospital Follow-up appointment confirmed?: NA Do you need transportation to your follow-up appointment?: No Do you understand care options if your condition(s) worsen?: Yes-patient verbalized understanding  SDOH Interventions Today    Flowsheet Row Most Recent Value  SDOH Interventions   Food Insecurity Interventions Intervention Not Indicated  Transportation Interventions Intervention Not Indicated  Utilities Interventions Intervention Not Indicated      Jodelle Gross RN, BSN, CCM [place]  [facility] RN Care Coordinator/ Transitions of Care Direct Dial: [phone]  Fax: [phone]

## 2023-08-24 ENCOUNTER — Telehealth: Payer: Self-pay | Admitting: Family Medicine

## 2023-08-24 NOTE — Telephone Encounter (Signed)
 Copied from CRM [id number]. Topic: Medical Record Request - Records Request >> Aug 24, 2023  9:37 AM Tisa Forester wrote: Reason for CRM: need copy medical records from previous bone density test done have a bone density test schedule for 09/19/23  at 2:30pm , needed to compare  with previous bone density scan.  the provider  Dr. Amelia Jurist is the one who placed the order , and the test will be done at a local hospital   Address of Dr. Amelia Jurist office is [address] [place] [address]  [facility] [id number]    patient call back  [phone] >> Aug 24, 2023  9:45 AM Tisa Forester wrote: Please reach out to patient when this is taken care of she stated no one been responding to her request

## 2023-12-30 ENCOUNTER — Other Ambulatory Visit: Payer: Self-pay | Admitting: Family Medicine
